# Patient Record
Sex: Male | Born: 1960 | Race: White | Hispanic: No | Marital: Married | Smoking: Former smoker
Health system: Southern US, Community
[De-identification: ages and names within clinical notes are randomized; demographics above are authoritative.]

## PROBLEM LIST (undated history)

## (undated) DIAGNOSIS — K219 Gastro-esophageal reflux disease without esophagitis: Secondary | ICD-10-CM

## (undated) DIAGNOSIS — G4730 Sleep apnea, unspecified: Secondary | ICD-10-CM

## (undated) DIAGNOSIS — M1990 Unspecified osteoarthritis, unspecified site: Secondary | ICD-10-CM

## (undated) DIAGNOSIS — E119 Type 2 diabetes mellitus without complications: Secondary | ICD-10-CM

## (undated) DIAGNOSIS — E785 Hyperlipidemia, unspecified: Secondary | ICD-10-CM

## (undated) DIAGNOSIS — E669 Obesity, unspecified: Secondary | ICD-10-CM

## (undated) DIAGNOSIS — I10 Essential (primary) hypertension: Secondary | ICD-10-CM

## (undated) DIAGNOSIS — G9389 Other specified disorders of brain: Secondary | ICD-10-CM

## (undated) DIAGNOSIS — I639 Cerebral infarction, unspecified: Secondary | ICD-10-CM

## (undated) HISTORY — DX: Other specified disorders of brain: G93.89

## (undated) HISTORY — DX: Type 2 diabetes mellitus without complications: E11.9

## (undated) HISTORY — DX: Hyperlipidemia, unspecified: E78.5

## (undated) HISTORY — DX: Sleep apnea, unspecified: G47.30

## (undated) HISTORY — DX: Cerebral infarction, unspecified: I63.9

## (undated) HISTORY — DX: Obesity, unspecified: E66.9

## (undated) HISTORY — DX: Gastro-esophageal reflux disease without esophagitis: K21.9

## (undated) HISTORY — PX: SHOULDER SURGERY: SHX246

## (undated) HISTORY — DX: Unspecified osteoarthritis, unspecified site: M19.90

---

## 2013-12-05 DIAGNOSIS — Z85828 Personal history of other malignant neoplasm of skin: Secondary | ICD-10-CM | POA: Insufficient documentation

## 2014-09-19 LAB — PSA

## 2014-11-13 ENCOUNTER — Telehealth: Payer: Self-pay

## 2014-11-13 ENCOUNTER — Other Ambulatory Visit: Payer: Self-pay | Admitting: Unknown Physician Specialty

## 2014-11-13 ENCOUNTER — Other Ambulatory Visit: Payer: Self-pay

## 2014-11-13 NOTE — Telephone Encounter (Signed)
This patient belongs to Kathrine Haddock; I only refilled it in her absence The error message may have been name mismatch Please verify pharmacy for her and let her know we got request for valasartan/hctz Thanks

## 2014-11-13 NOTE — Telephone Encounter (Signed)
Paul Cannon had already filled RX.

## 2014-11-13 NOTE — Telephone Encounter (Signed)
I did not get a message about it but we do have that patient in [facility] and he does take it.

## 2014-11-13 NOTE — Telephone Encounter (Signed)
-----   Message from Arnetha Courser, MD sent at 11/13/2014 12:48 PM EDT ----- Regarding: Can you check [facility] valasartan/hctz refill? I got an error message for a BP med I can't open up [facility] to verify this is the correct patient Can you check or do you have a message about this too?

## 2014-11-24 ENCOUNTER — Telehealth: Payer: Self-pay

## 2014-11-24 NOTE — Telephone Encounter (Signed)
-----   Message from Otilio Jefferson sent at 11/10/2014 10:08 AM EDT ----- Regarding: Fwd: Re: Referrals From: Carlota Raspberry Sent: 10/24/2014  Phone Encounter: 03/14/2013  > From: Tia Masker > To: Carlota Raspberry > Sent: 10/15/2014 3:08 PM > new # to call [phone] pt called to sch colon  > From: Carlota Raspberry > To: Carlota Raspberry > Sent: 10/09/2014 9:24 AM > Phoned pt LM to contact office   > From: Shary Decamp > To: Colonoscopy, Triage > Sent: 09/23/2014 11:38 AM > Please contact pt for Colonoscopy Triage.

## 2014-11-24 NOTE — Telephone Encounter (Signed)
Letter was mailed to pt on 10-24-14 to call and schedule colonoscopy.

## 2014-12-17 ENCOUNTER — Other Ambulatory Visit: Payer: Self-pay | Admitting: Family Medicine

## 2015-01-19 ENCOUNTER — Other Ambulatory Visit: Payer: Self-pay | Admitting: Family Medicine

## 2015-01-19 NOTE — Telephone Encounter (Signed)
Routine to patient's primary provider

## 2015-02-24 ENCOUNTER — Ambulatory Visit
Admission: EM | Admit: 2015-02-24 | Discharge: 2015-02-24 | Disposition: A | Discharge status: Home / Self Care | Payer: Commercial Managed Care - PPO | Attending: Family Medicine | Admitting: Family Medicine

## 2015-02-24 DIAGNOSIS — L03116 Cellulitis of left lower limb: Secondary | ICD-10-CM | POA: Diagnosis not present

## 2015-02-24 DIAGNOSIS — E876 Hypokalemia: Secondary | ICD-10-CM

## 2015-02-24 HISTORY — DX: Essential (primary) hypertension: I10

## 2015-02-24 LAB — COMPREHENSIVE METABOLIC PANEL
ALT: 29 U/L (ref 17–63)
AST: 30 U/L (ref 15–41)
Albumin: 4 g/dL (ref 3.5–5.0)
Alkaline Phosphatase: 83 U/L (ref 38–126)
Anion gap: 10 (ref 5–15)
BUN: 18 mg/dL (ref 6–20)
CO2: 31 mmol/L (ref 22–32)
Calcium: 8.6 mg/dL — ABNORMAL LOW (ref 8.9–10.3)
Chloride: 97 mmol/L — ABNORMAL LOW (ref 101–111)
Creatinine, Ser: 1.14 mg/dL (ref 0.61–1.24)
GFR calc Af Amer: >60 mL/min (ref >60)
GFR calc non Af Amer: >60 mL/min (ref >60)
Glucose, Bld: 114 mg/dL — ABNORMAL HIGH (ref 65–99)
Potassium: 2.9 mmol/L — CL (ref 3.5–5.1)
Sodium: 138 mmol/L (ref 135–145)
Total Bilirubin: 0.8 mg/dL (ref 0.3–1.2)
Total Protein: 7.9 g/dL (ref 6.5–8.1)

## 2015-02-24 LAB — CBC WITH DIFFERENTIAL/PLATELET
Basophils Absolute: 0 10*3/uL (ref 0–0.1)
Basophils Relative: 0 %
Eosinophils Absolute: 0.1 10*3/uL (ref 0–0.7)
Eosinophils Relative: 2 %
HCT: 32.7 % — ABNORMAL LOW (ref 40.0–52.0)
Hemoglobin: 11 g/dL — ABNORMAL LOW (ref 13.0–18.0)
Lymphocytes Relative: 16 %
Lymphs Abs: 0.9 10*3/uL — ABNORMAL LOW (ref 1.0–3.6)
MCH: 27.5 pg (ref 26.0–34.0)
MCHC: 33.8 g/dL (ref 32.0–36.0)
MCV: 81.5 fL (ref 80.0–100.0)
Monocytes Absolute: 0.5 10*3/uL (ref 0.2–1.0)
Monocytes Relative: 9 %
Neutro Abs: 4.1 10*3/uL (ref 1.4–6.5)
Neutrophils Relative %: 73 %
Platelets: 224 10*3/uL (ref 150–440)
RBC: 4.01 MIL/uL — ABNORMAL LOW (ref 4.40–5.90)
RDW: 14.8 % — ABNORMAL HIGH (ref 11.5–14.5)
WBC: 5.5 10*3/uL (ref 3.8–10.6)

## 2015-02-24 MED ORDER — DOXYCYCLINE HYCLATE 100 MG PO CAPS: 100.0000 mg | ORAL_CAPSULE | Freq: Two times a day (BID) | ORAL | Status: DC

## 2015-02-24 MED ORDER — IBUPROFEN 800 MG PO TABS: 800.0000 mg | ORAL_TABLET | Freq: Three times a day (TID) | ORAL | Status: DC | PRN

## 2015-02-24 MED ORDER — POTASSIUM CHLORIDE ER 10 MEQ PO TBCR: 20.0000 meq | EXTENDED_RELEASE_TABLET | Freq: Every day | ORAL | Status: DC

## 2015-02-24 MED ORDER — POTASSIUM CHLORIDE CRYS ER 20 MEQ PO TBCR
40.0000 meq | EXTENDED_RELEASE_TABLET | Freq: Once | ORAL | Status: AC
  Administered 2015-02-24: 40 meq via ORAL

## 2015-02-24 NOTE — ED Notes (Signed)
Pt states "I have been tired and achey since the weekend. I have noticed over the last few days I have a sore spot on my left posterior leg and it spreading."

## 2015-02-24 NOTE — Discharge Instructions (Signed)
Take medication as prescribed. Apply warm compresses and elevate. Monitor skin area closely.   Also as discussed, your potassium was low today and after discussing may be related to your diet changes. Take medication as prescribed.   It is very important that you have close follow up on both the skin infection as well as potassium. You need to have both follow up on in 2-3 days. Call your primary care physician today to schedule follow up.   Return to Urgent care or go to ER for fever, weakness, chest pain, shortness of breath, dizziness, new or worsening concerns.   Cellulitis Cellulitis is an infection of the skin and the tissue beneath it. The infected area is usually red and tender. Cellulitis occurs most often in the arms and lower legs.  CAUSES  Cellulitis is caused by bacteria that enter the skin through cracks or cuts in the skin. The most common types of bacteria that cause cellulitis are staphylococci and streptococci. SIGNS AND SYMPTOMS   Redness and warmth.  Swelling.  Tenderness or pain.  Fever. DIAGNOSIS  Your health care provider can usually determine what is wrong based on a physical exam. Blood tests may also be done. TREATMENT  Treatment usually involves taking an antibiotic medicine. HOME CARE INSTRUCTIONS   Take your antibiotic medicine as directed by your health care provider. Finish the antibiotic even if you start to feel better.  Keep the infected arm or leg elevated to reduce swelling.  Apply a warm cloth to the affected area up to 4 times per day to relieve pain.  Take medicines only as directed by your health care provider.  Keep all follow-up visits as directed by your health care provider. SEEK MEDICAL CARE IF:   You notice red streaks coming from the infected area.  Your red area gets larger or turns dark in color.  Your bone or joint underneath the infected area becomes painful after the skin has healed.  Your infection returns in the same  area or another area.  You notice a swollen bump in the infected area.  You develop new symptoms.  You have a fever. SEEK IMMEDIATE MEDICAL CARE IF:   You feel very sleepy.  You develop vomiting or diarrhea.  You have a general ill feeling (malaise) with muscle aches and pains. MAKE SURE YOU:   Understand these instructions.  Will watch your condition.  Will get help right away if you are not doing well or get worse. Document Released: 03/02/2005 Document Revised: 10/07/2013 Document Reviewed: 08/08/2011 [facility] Patient Information 2015 [place], [place]. This information is not intended to replace advice given to you by your health care provider. Make sure you discuss any questions you have with your health care provider.  Hypokalemia Hypokalemia means that the amount of potassium in the blood is lower than normal.Potassium is a chemical, called an electrolyte, that helps regulate the amount of fluid in the body. It also stimulates muscle contraction and helps nerves function properly.Most of the body's potassium is inside of cells, and only a very small amount is in the blood. Because the amount in the blood is so small, minor changes can be life-threatening. CAUSES  Antibiotics.  Diarrhea or vomiting.  Using laxatives too much, which can cause diarrhea.  Chronic kidney disease.  Water pills (diuretics).  Eating disorders (bulimia).  Low magnesium level.  Sweating a lot. SIGNS AND SYMPTOMS  Weakness.  Constipation.  Fatigue.  Muscle cramps.  Mental confusion.  Skipped heartbeats or irregular heartbeat (  palpitations).  Tingling or numbness. DIAGNOSIS  Your health care provider can diagnose hypokalemia with blood tests. In addition to checking your potassium level, your health care provider may also check other lab tests. TREATMENT Hypokalemia can be treated with potassium supplements taken by mouth or adjustments in your current medicines. If your  potassium level is very low, you may need to get potassium through a vein (IV) and be monitored in the hospital. A diet high in potassium is also helpful. Foods high in potassium are:  Nuts, such as peanuts and pistachios.  Seeds, such as sunflower seeds and pumpkin seeds.  Peas, lentils, and lima beans.  Whole grain and bran cereals and breads.  Fresh fruit and vegetables, such as apricots, avocado, bananas, cantaloupe, kiwi, oranges, tomatoes, asparagus, and potatoes.  Orange and tomato juices.  Red meats.  Fruit yogurt. HOME CARE INSTRUCTIONS  Take all medicines as prescribed by your health care provider.  Maintain a healthy diet by including nutritious food, such as fruits, vegetables, nuts, whole grains, and lean meats.  If you are taking a laxative, be sure to follow the directions on the label. SEEK MEDICAL CARE IF:  Your weakness gets worse.  You feel your heart pounding or racing.  You are vomiting or having diarrhea.  You are diabetic and having trouble keeping your blood glucose in the normal range. SEEK IMMEDIATE MEDICAL CARE IF:  You have chest pain, shortness of breath, or dizziness.  You are vomiting or having diarrhea for more than 2 days.  You faint. MAKE SURE YOU:   Understand these instructions.  Will watch your condition.  Will get help right away if you are not doing well or get worse. Document Released: 05/23/2005 Document Revised: 03/13/2013 Document Reviewed: 11/23/2012 [facility] Patient Information 2015 [place], [place]. This information is not intended to replace advice given to you by your health care provider. Make sure you discuss any questions you have with your health care provider.

## 2015-02-24 NOTE — ED Provider Notes (Signed)
[facility] Emergency Department Provider Note  ____________________________________________  Time seen: Approximately 4:14 PM  I have reviewed the triage vital signs and the nursing notes.   HISTORY  Chief Complaint Insect Bite   HPI Paul Cannon is a 54 y.o. male presents with complaints of redness and swelling to the left upper leg. Patient reports that Sunday he noticed a area that was about the size of a quarter that was slightly red and tender to touch. Patient reports that that area has quickly increased in size.  States the area is tender to touch at 5 out of 6. States that it is red and warm to touch. Denies any drainage. Denies known insect bite. Denies recent tick bite or known tick exposure. 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 Reports yesterday he had an accompanying fever of 100.5 orally. States that when he was having the fever he was having body aches which he then took ibuprofen and resolved fever as well as the body aches. Denies any other body aches. Denies feeling weak. Denies other changes.  Denies chest pain, shortness of breath, dizziness, vision changes, chest palpitations, weakness, abdominal pain, dysuria or other changes.   Past Medical History  Diagnosis Date  . Hypertension     There are no active problems to display for this patient.   History reviewed. No pertinent past surgical history.  Current Outpatient Rx  Name  Route  Sig  Dispense  Refill  . amLODipine (NORVASC) 10 MG tablet   Oral   Take 1 tablet (10 mg total) by mouth daily.   90 tablet   1   . atorvastatin (LIPITOR) 10 MG tablet   Oral   Take 10 mg by mouth daily.         . metoprolol succinate (TOPROL-XL) 25 MG 24 hr tablet   Oral   Take 1 tablet (25 mg total) by mouth daily.   90 tablet   1   . valsartan (DIOVAN) 160 MG tablet   Oral   Take 160 mg by mouth daily.         . valsartan-hydrochlorothiazide  (DIOVAN-HCT) 160-25 MG per tablet   Oral   Take 1 tablet by mouth daily.   30 tablet   6   . doxycycline (VIBRAMYCIN) 100 MG capsule   Oral   Take 1 capsule (100 mg total) by mouth 2 (two) times daily.   20 capsule   0   . ibuprofen (ADVIL,MOTRIN) 800 MG tablet   Oral   Take 1 tablet (800 mg total) by mouth every 8 (eight) hours as needed for mild pain or moderate pain.   15 tablet   0   . potassium chloride (K-DUR) 10 MEQ tablet   Oral   Take 2 tablets (20 mEq total) by mouth daily.   10 tablet   0     Allergies Review of patient's allergies indicates no known allergies.  No family history on file.  Social History Social History  Substance Use Topics  . Smoking status: Never Smoker   . Smokeless tobacco: None  . Alcohol Use: No    Review of Systems Constitutional: positive fever as above Eyes: No visual changes. ENT: No sore throat. Cardiovascular: Denies chest pain. Respiratory: Denies shortness of breath. Gastrointestinal: No abdominal pain.  No nausea, no vomiting.  No diarrhea.  No constipation. Genitourinary: Negative for dysuria. Musculoskeletal: Negative for back pain. Skin: positive for rash. Neurological: Negative for headaches, focal weakness  or numbness.  10-point ROS otherwise negative.  ____________________________________________   PHYSICAL EXAM:  VITAL SIGNS: ED Triage Vitals  Enc Vitals Group     BP 02/24/15 1504 134/61 mmHg     Pulse Rate 02/24/15 1504 55     Resp 02/24/15 1504 16     Temp 02/24/15 1504 98.3 F (36.8 C)     Temp Source 02/24/15 1504 Oral     SpO2 02/24/15 1504 100 %     Weight 02/24/15 1504 305 lb (138.347 kg)     Height 02/24/15 1504 6' (1.829 m)     Head Cir --      Peak Flow --      Pain Score 02/24/15 1517 0     Pain Loc --      Pain Edu? --      Excl. in [place]? --     Constitutional: Alert and oriented. Well appearing and in no acute distress. Eyes: Conjunctivae are normal. PERRL. EOMI. Head:  Atraumatic.  Ears: no erythema, normal TMs bilaterally.   Nose: No congestion/rhinnorhea.  Mouth/Throat: Mucous membranes are moist.  Oropharynx non-erythematous. Neck: No stridor.  No cervical spine tenderness to palpation. Hematological/Lymphatic/Immunilogical: No cervical lymphadenopathy. Cardiovascular: Normal rate, regular rhythm. Grossly normal heart sounds.  Good peripheral circulation. Respiratory: Normal respiratory effort.  No retractions. Lungs CTAB. Gastrointestinal: Soft and nontender. No distention. Normal Bowel sounds.  No abdominal bruits. No CVA tenderness. Musculoskeletal: No lower or upper extremity tenderness nor edema.  No joint effusions. Bilateral pedal pulses equal and easily palpated. Bilateral calfs nontender.  Neurologic:  Normal speech and language. No gross focal neurologic deficits are appreciated. No gait instability. Skin:  Skin is warm, dry and intact. No rash noted. Except: left distal upper medial to posterior thigh with 15 cm x 14 cm area or erythema, mild induration, no fluctuance or pointing abscess.Mild TTP.  No distinct central punctum. No other rash, erythema noted.  Psychiatric: Mood and affect are normal. Speech and behavior are normal.  ____________________________________________   LABS (all labs ordered are listed, but only abnormal results are displayed)  Labs Reviewed  CBC WITH DIFFERENTIAL/PLATELET - Abnormal; Notable for the following:    RBC 4.01 (*)    Hemoglobin 11.0 (*)    HCT 32.7 (*)    RDW 14.8 (*)    Lymphs Abs 0.9 (*)    All other components within normal limits  COMPREHENSIVE METABOLIC PANEL - Abnormal; Notable for the following:    Potassium 2.9 (*)    Chloride 97 (*)    Glucose, Bld 114 (*)    Calcium 8.6 (*)    All other components within normal limits  ROCKY MTN SPOTTED FVR ABS PNL(IGG+IGM)  B. BURGDORFI ANTIBODIES   Discussed to evaluate EKG with patient due to hypokalemia, Patient refused and states does not need  EKG. Alert and oriented with decisional capacity and patient declined ekg. Discussed risks and benefits including up to death and patient verbalized understanding.   INITIAL IMPRESSION / ASSESSMENT AND PLAN / ED COURSE  Pertinent labs & imaging results that were available during my care of the patient were reviewed by me and considered in my medical decision making (see chart for details).   Very well-appearing patient. No acute distress. Presents due to the complaint of left upper inner thigh redness and tenderness to touch 2 days. Patient also reports that he had a fever with this yesterday at 100.5 orally. Patient denies other fever. Reports that he just feels that he  has had a fever with the achy body sensations yesterday only. Patient with left upper inner thigh cellulitis. Will proceed and evaluate labs including WBC. As patient reports he does occasionally works outside will evaluate for lymes as well as [employer] spotted fever. Patient denies recent tick bite or tick exposure.  Discussed with patient in detail regarding laboratory results.discussed, reviewed labs, and discussed plan of care with Dr Alveta Heimlich.  White blood cell count 5.5. However note of hypokalemia potassium 2.9. Patient denies recent changes in his medications in years. Denies recent diarrhea. Denies weakness, dizziness, chest pain, palpitations or other complaints. Patient does report that over the last several weeks he has tried a new diet. States that his primary doing an Atkins version of a diet. States with his diet he is only eating primarily meat with a very low carbohydrate intake and very low fruits and vegetable intake. Patient does take Diovan/HCTZ combination to metoprolol for his blood pressure. Reports that this is an unchanged for years. As no other changes except for his recent diet change suspect diet change indication for cause of his lower potassium. 40 mEq potassium given 1 in urgent care. Will also prescribe 20  mEq orally daily for next 5 days. Patient primary care physician is Kathrine Haddock [place] [facility]. Discussed in detail with patient to have very close follow-up. Recommend potassium reevaluation within 2-3 days. Reiterated the importance of this follow-up with patient. Also recommended 2-3 days follow-up and cellulitis recheck. Will treat cellulitis with oral doxycycline as well as rest and elevation and supportive treatments. Area of skin demarcation along cellulitis edges marked with surgical pen. Discussed with patient return parameters including worsening of redness, swelling or pain as well as weakness, dizziness, chest pain, palpitations as well as other worsening concerns.  Discussed follow up with Primary care physician this week. Discussed follow up and return parameters including no resolution or any worsening concerns. Patient verbalized understanding and agreed to plan.   ____________________________________________   FINAL CLINICAL IMPRESSION(S) / ED DIAGNOSES  Final diagnoses:  Cellulitis of left leg  Hypokalemia       Marylene Land, NP 02/24/15 1911

## 2015-02-25 LAB — B. BURGDORFI ANTIBODIES: B burgdorferi Ab IgG+IgM: <0.91 {ISR} (ref 0.00–0.90)

## 2015-02-25 LAB — ROCKY MTN SPOTTED FVR ABS PNL(IGG+IGM)
RMSF IgG: NEGATIVE
RMSF IgM: 0.23 index (ref 0.00–0.89)

## 2015-03-02 DIAGNOSIS — E669 Obesity, unspecified: Secondary | ICD-10-CM

## 2015-03-02 DIAGNOSIS — G4730 Sleep apnea, unspecified: Secondary | ICD-10-CM | POA: Insufficient documentation

## 2015-03-02 DIAGNOSIS — I10 Essential (primary) hypertension: Secondary | ICD-10-CM | POA: Insufficient documentation

## 2015-03-02 DIAGNOSIS — E785 Hyperlipidemia, unspecified: Secondary | ICD-10-CM | POA: Insufficient documentation

## 2015-03-02 DIAGNOSIS — I152 Hypertension secondary to endocrine disorders: Secondary | ICD-10-CM | POA: Insufficient documentation

## 2015-03-03 ENCOUNTER — Ambulatory Visit: Payer: Self-pay | Admitting: Unknown Physician Specialty

## 2015-03-03 ENCOUNTER — Ambulatory Visit (INDEPENDENT_AMBULATORY_CARE_PROVIDER_SITE_OTHER): Payer: Commercial Managed Care - PPO | Admitting: Unknown Physician Specialty

## 2015-03-03 ENCOUNTER — Encounter: Payer: Self-pay | Admitting: Unknown Physician Specialty

## 2015-03-03 VITALS — BP 110/68 | HR 45 | Temp 98.5°F | Ht 70.7 in | Wt 304.6 lb

## 2015-03-03 DIAGNOSIS — E669 Obesity, unspecified: Secondary | ICD-10-CM | POA: Diagnosis not present

## 2015-03-03 DIAGNOSIS — R7301 Impaired fasting glucose: Secondary | ICD-10-CM | POA: Diagnosis not present

## 2015-03-03 DIAGNOSIS — I10 Essential (primary) hypertension: Secondary | ICD-10-CM

## 2015-03-03 DIAGNOSIS — E785 Hyperlipidemia, unspecified: Secondary | ICD-10-CM

## 2015-03-03 LAB — LIPID PANEL PICCOLO, WAIVED
Chol/HDL Ratio Piccolo,Waive: 3.2 mg/dL
Cholesterol Piccolo, Waived: 92 mg/dL (ref <200)
HDL Chol Piccolo, Waived: 29 mg/dL — ABNORMAL LOW (ref >59)
LDL Chol Calc Piccolo Waived: 44 mg/dL (ref <100)
Triglycerides Piccolo,Waived: 96 mg/dL (ref <150)
VLDL Chol Calc Piccolo,Waive: 19 mg/dL (ref <30)

## 2015-03-03 LAB — BAYER DCA HB A1C WAIVED: HB A1C (BAYER DCA - WAIVED): 5.8 % (ref <7.0)

## 2015-03-03 MED ORDER — ATORVASTATIN CALCIUM 10 MG PO TABS: 10.0000 mg | ORAL_TABLET | Freq: Every day | ORAL | Status: DC

## 2015-03-03 MED ORDER — VALSARTAN 160 MG PO TABS: 160.0000 mg | ORAL_TABLET | Freq: Every day | ORAL | Status: DC

## 2015-03-03 NOTE — Progress Notes (Signed)
BP 110/68 mmHg  Pulse 45  Temp(Src) 98.5 F (36.9 C)  Ht 5' 10.7" (1.796 m)  Wt 304 lb 9.6 oz (138.166 kg)  BMI 42.83 kg/m2  SpO2 97%   Subjective:    Patient ID: Paul Cannon, male    DOB: 03/03/1961, 54 y.o.   MRN: [id number]  HPI: Paul Cannon is a 54 y.o. male  Chief Complaint  Patient presents with  . Hyperlipidemia  . Hypertension    HYPERTENSION / HYPERLIPIDEMIA He is on an Adkin's diet and losing weight.  Noted a 14 pound weight loss from the past visit   Patient is requesting refill(s). Satisfied with current treatment?  no H6  Duration of hypertension:  chronic  H4  BP monitoring frequency:  not checking.  H5  BP medication side effects:  no P1 Past BP meds:  none  P1  Duration of hyperlipidemia:  chronic  H4  Cholesterol medication side effects:  no P1  Cholesterol supplements:  none  P1 Past cholesterol medications:  none  P1 Medication compliance:  excellent compliance  P1  Aspirin:  Aspirin Therapy  on 01/29/13 Recent stressors:  no  H6   Recurrent headaches:  no  R10 Visual changes:  no  R2  Palpitations:  no  R4  Dyspnea:  no  R5  Chest pain:  no  R4  Lower extremity edema:  no  R4  Dizzy/lightheaded:  no  R10  Cellulits One week f/u.  Pt states he went to urgent care 9/20 and diagnosed with cellulitis.  He went to Urgent care and given Doxycycine to take BID.  He states it is better.     Relevant past medical, surgical, family and social history reviewed and updated as indicated. Interim medical history since our last visit reviewed. Allergies and medications reviewed and updated.  Review of Systems  Constitutional: Negative.   HENT: Negative.   Eyes: Negative.   Respiratory: Negative.   Cardiovascular: Negative.   Gastrointestinal: Negative.   Endocrine: Negative.   Genitourinary: Negative.   Skin: Negative.   Allergic/Immunologic: Negative.   Neurological: Negative.   Hematological: Negative.   Psychiatric/Behavioral:  Negative.     Per HPI unless specifically indicated above     Objective:    BP 110/68 mmHg  Pulse 45  Temp(Src) 98.5 F (36.9 C)  Ht 5' 10.7" (1.796 m)  Wt 304 lb 9.6 oz (138.166 kg)  BMI 42.83 kg/m2  SpO2 97%  Wt Readings from Last 3 Encounters:  03/03/15 304 lb 9.6 oz (138.166 kg)  09/19/14 328 lb (148.78 kg)  02/24/15 305 lb (138.347 kg)    Physical Exam  Constitutional: He is oriented to person, place, and time. He appears well-developed and well-nourished. No distress.  HENT:  Head: Normocephalic and atraumatic.  Eyes: Conjunctivae and lids are normal. Right eye exhibits no discharge. Left eye exhibits no discharge. No scleral icterus.  Cardiovascular: Normal rate, regular rhythm and normal heart sounds.   Pulmonary/Chest: Effort normal and breath sounds normal. No respiratory distress.  Abdominal: Normal appearance. There is no splenomegaly or hepatomegaly.  Musculoskeletal: Normal range of motion.  Neurological: He is alert and oriented to person, place, and time.  Skin: Skin is warm, dry and intact. No rash noted. No pallor.  Circled area on left leg showing no erythema  Psychiatric: He has a normal mood and affect. His behavior is normal. Judgment and thought content normal.  Vitals reviewed.     Assessment & Plan:   Problem  List Items Addressed This Visit      Unprioritized   Hypertension - Primary    Pt states at urgent care K was low.  Pt is losing weight and BP is good, I will stop the HCTZ.        Relevant Medications   valsartan (DIOVAN) 160 MG tablet   atorvastatin (LIPITOR) 10 MG tablet   Other Relevant Orders   Uric acid   Comprehensive metabolic panel   Hyperlipidemia    LDL is 40.  Cut Atorvastatin in 1/2.        Relevant Medications   valsartan (DIOVAN) 160 MG tablet   atorvastatin (LIPITOR) 10 MG tablet   Other Relevant Orders   Lipid Panel Piccolo, Waived   Obesity    Losing weight through Atkins      Relevant Orders   Bayer DCA  Hb A1c Waived   IFG (impaired fasting glucose)    Hgb A1C is 5.8.  Continue diet and exercise.      Relevant Orders   Bayer DCA Hb A1c Waived       Follow up plan: Return in about 6 months (around 08/31/2015).

## 2015-03-03 NOTE — Assessment & Plan Note (Signed)
Losing weight through [employer]

## 2015-03-03 NOTE — Assessment & Plan Note (Signed)
Pt states at urgent care K was low.  Pt is losing weight and BP is good, I will stop the HCTZ.

## 2015-03-03 NOTE — Assessment & Plan Note (Signed)
Hgb A1C is 5.8.  Continue diet and exercise.

## 2015-03-03 NOTE — Assessment & Plan Note (Deleted)
Diet controlled.  

## 2015-03-03 NOTE — Assessment & Plan Note (Signed)
LDL is 40.  Cut Atorvastatin in 1/2.

## 2015-03-04 ENCOUNTER — Encounter: Payer: Self-pay | Admitting: Unknown Physician Specialty

## 2015-03-04 LAB — COMPREHENSIVE METABOLIC PANEL
ALT: 30 IU/L (ref 0–44)
AST: 25 IU/L (ref 0–40)
Albumin/Globulin Ratio: 1.6 (ref 1.1–2.5)
Albumin: 4 g/dL (ref 3.5–5.5)
Alkaline Phosphatase: 84 IU/L (ref 39–117)
BUN/Creatinine Ratio: 22 — ABNORMAL HIGH (ref 9–20)
BUN: 24 mg/dL (ref 6–24)
Bilirubin Total: 0.3 mg/dL (ref 0.0–1.2)
CO2: 28 mmol/L (ref 18–29)
Calcium: 9.1 mg/dL (ref 8.7–10.2)
Chloride: 99 mmol/L (ref 97–108)
Creatinine, Ser: 1.11 mg/dL (ref 0.76–1.27)
GFR calc Af Amer: 87 mL/min/{1.73_m2} (ref >59)
GFR calc non Af Amer: 75 mL/min/{1.73_m2} (ref >59)
Globulin, Total: 2.5 g/dL (ref 1.5–4.5)
Glucose: 107 mg/dL — ABNORMAL HIGH (ref 65–99)
Potassium: 4 mmol/L (ref 3.5–5.2)
Sodium: 142 mmol/L (ref 134–144)
Total Protein: 6.5 g/dL (ref 6.0–8.5)

## 2015-03-04 LAB — URIC ACID: Uric Acid: 10.5 mg/dL — ABNORMAL HIGH (ref 3.7–8.6)

## 2015-06-22 ENCOUNTER — Other Ambulatory Visit: Payer: Self-pay | Admitting: Unknown Physician Specialty

## 2015-07-27 ENCOUNTER — Other Ambulatory Visit: Payer: Self-pay | Admitting: Unknown Physician Specialty

## 2015-09-21 ENCOUNTER — Encounter: Payer: Commercial Managed Care - PPO | Admitting: Unknown Physician Specialty

## 2015-09-28 ENCOUNTER — Other Ambulatory Visit: Payer: Self-pay | Admitting: Unknown Physician Specialty

## 2015-10-01 ENCOUNTER — Other Ambulatory Visit: Payer: Self-pay | Admitting: Unknown Physician Specialty

## 2015-10-13 ENCOUNTER — Encounter: Payer: Self-pay | Admitting: Unknown Physician Specialty

## 2015-10-13 ENCOUNTER — Ambulatory Visit (INDEPENDENT_AMBULATORY_CARE_PROVIDER_SITE_OTHER): Payer: Commercial Managed Care - PPO | Admitting: Unknown Physician Specialty

## 2015-10-13 VITALS — BP 146/76 | HR 41 | Temp 98.0°F | Ht 70.6 in | Wt 293.6 lb

## 2015-10-13 DIAGNOSIS — I10 Essential (primary) hypertension: Secondary | ICD-10-CM | POA: Diagnosis not present

## 2015-10-13 DIAGNOSIS — R001 Bradycardia, unspecified: Secondary | ICD-10-CM | POA: Insufficient documentation

## 2015-10-13 DIAGNOSIS — E785 Hyperlipidemia, unspecified: Secondary | ICD-10-CM

## 2015-10-13 DIAGNOSIS — E669 Obesity, unspecified: Secondary | ICD-10-CM

## 2015-10-13 DIAGNOSIS — M62830 Muscle spasm of back: Secondary | ICD-10-CM | POA: Insufficient documentation

## 2015-10-13 DIAGNOSIS — Z0000 Encounter for general adult medical examination without abnormal findings: Secondary | ICD-10-CM

## 2015-10-13 MED ORDER — ATORVASTATIN CALCIUM 10 MG PO TABS: 10.0000 mg | ORAL_TABLET | Freq: Every day | ORAL | Status: DC

## 2015-10-13 MED ORDER — AMLODIPINE BESYLATE 10 MG PO TABS: 10.0000 mg | ORAL_TABLET | Freq: Every day | ORAL | Status: DC

## 2015-10-13 MED ORDER — METHOCARBAMOL 750 MG PO TABS: 750.0000 mg | ORAL_TABLET | Freq: Four times a day (QID) | ORAL | Status: DC | PRN

## 2015-10-13 MED ORDER — VALSARTAN 320 MG PO TABS: 320.0000 mg | ORAL_TABLET | Freq: Every day | ORAL | Status: DC

## 2015-10-13 NOTE — Assessment & Plan Note (Signed)
Stop Metoprolol 

## 2015-10-13 NOTE — Assessment & Plan Note (Signed)
Check LDL. 

## 2015-10-13 NOTE — Assessment & Plan Note (Signed)
Pt with continued weight loss

## 2015-10-13 NOTE — Assessment & Plan Note (Addendum)
Stop Metoprolol.  Recheck back in 4 weeks.  Monitor at home.  Increase Divan from 160 mg tto 320 mgs.  Sinus bradycardia on EKG

## 2015-10-13 NOTE — Progress Notes (Signed)
BP 146/76 mmHg  Pulse 41  Temp(Src) 98 F (36.7 C)  Ht 5' 10.6" (1.793 m)  Wt 293 lb 9.6 oz (133.176 kg)  BMI 41.43 kg/m2  SpO2 96%   Subjective:    Patient ID: Paul Cannon, male    DOB: [birth year], 55 y.o.   MRN: [id number]  HPI: Paul Cannon is a 55 y.o. male  Chief Complaint  Patient presents with  . Annual Exam   Hypertension He is losing weight through making diet changes.  Took him off HCTZ last visit as went to the ER for low K  Using medications without difficulty Average home BP Not checking  No problems or lightheadedness No chest pain with exertion or shortness of breath No Edema  Back pain States he gets back spasms about 1-2 times/year that last 2-3 days.  Was taking Robaxin but wasn't working.  But, he doesn't hant anything to make him sleepy.  He does have a refill but doesn't pick it up.     Depression screen PHQ 2/9 10/13/2015  Decreased Interest 0  Down, Depressed, Hopeless 0  PHQ - 2 Score 0     Social History   Social History  . Marital Status: Single    Spouse Name: N/A  . Number of Children: N/A  . Years of Education: N/A   Occupational History  . Not on file.   Social History Main Topics  . Smoking status: Former Smoker    Quit date: 09/30/2009  . Smokeless tobacco: Never Used  . Alcohol Use: Yes     Comment: pt states occasional or very rarely  . Drug Use: No  . Sexual Activity: Yes   Other Topics Concern  . Not on file   Social History Narrative   Family History  Problem Relation Age of Onset  . Arthritis Mother   . Hyperlipidemia Mother   . Hypertension Mother   . Migraines Mother   . Heart disease Father     MI  . Stroke Father   . Lung disease Father   . Cancer Sister     breast  . Cancer Brother     melanoma  . Stroke Sister    Past Surgical History  Procedure Laterality Date  . Shoulder surgery     Past Medical History  Diagnosis Date  . Hypertension   . Sleep apnea   . Hyperlipidemia    . Obesity      Relevant past medical, surgical, family and social history reviewed and updated as indicated. Interim medical history since our last visit reviewed. Allergies and medications reviewed and updated.  Review of Systems  Per HPI unless specifically indicated above     Objective:    BP 146/76 mmHg  Pulse 41  Temp(Src) 98 F (36.7 C)  Ht 5' 10.6" (1.793 m)  Wt 293 lb 9.6 oz (133.176 kg)  BMI 41.43 kg/m2  SpO2 96%  Wt Readings from Last 3 Encounters:  10/13/15 293 lb 9.6 oz (133.176 kg)  03/03/15 304 lb 9.6 oz (138.166 kg)  09/19/14 328 lb (148.78 kg)    Physical Exam  Constitutional: He is oriented to person, place, and time. He appears well-developed and well-nourished.  HENT:  Head: Normocephalic.  Right Ear: Tympanic membrane, external ear and ear canal normal.  Left Ear: Tympanic membrane, external ear and ear canal normal.  Mouth/Throat: Uvula is midline, oropharynx is clear and moist and mucous membranes are normal.  Eyes: Pupils are equal, round, and reactive to  light.  Cardiovascular: Normal rate, regular rhythm and normal heart sounds.  Exam reveals no gallop and no friction rub.   No murmur heard. Pulmonary/Chest: Effort normal and breath sounds normal. No respiratory distress.  Abdominal: Soft. Bowel sounds are normal. He exhibits no distension. There is no tenderness.  Musculoskeletal: Normal range of motion.  Neurological: He is alert and oriented to person, place, and time. He has normal reflexes.  Skin: Skin is warm and dry.  Psychiatric: He has a normal mood and affect. His behavior is normal. Judgment and thought content normal.    Results for orders placed or performed in visit on 03/03/15  Bayer DCA Hb A1c Waived  Result Value Ref Range   Bayer DCA Hb A1c Waived 5.8 <7.0 %  Lipid Panel Piccolo, Waived  Result Value Ref Range   Cholesterol Piccolo, Waived 92 <200 mg/dL   HDL Chol Piccolo, Waived 29 (L) >59 mg/dL   Triglycerides  Piccolo,Waived 96 <150 mg/dL   Chol/HDL Ratio Piccolo,Waive 3.2 mg/dL   LDL Chol Calc Piccolo Waived 44 <100 mg/dL   VLDL Chol Calc Piccolo,Waive 19 <30 mg/dL  Uric acid  Result Value Ref Range   Uric Acid 10.5 (H) 3.7 - 8.6 mg/dL  Comprehensive metabolic panel  Result Value Ref Range   Glucose 107 (H) 65 - 99 mg/dL   BUN 24 6 - 24 mg/dL   Creatinine, Ser 1.11 0.76 - 1.27 mg/dL   GFR calc non Af Amer 75 >59 mL/min/1.73   GFR calc Af Amer 87 >59 mL/min/1.73   BUN/Creatinine Ratio 22 (H) 9 - 20   Sodium 142 134 - 144 mmol/L   Potassium 4.0 3.5 - 5.2 mmol/L   Chloride 99 97 - 108 mmol/L   CO2 28 18 - 29 mmol/L   Calcium 9.1 8.7 - 10.2 mg/dL   Total Protein 6.5 6.0 - 8.5 g/dL   Albumin 4.0 3.5 - 5.5 g/dL   Globulin, Total 2.5 1.5 - 4.5 g/dL   Albumin/Globulin Ratio 1.6 1.1 - 2.5   Bilirubin Total 0.3 0.0 - 1.2 mg/dL   Alkaline Phosphatase 84 39 - 117 IU/L   AST 25 0 - 40 IU/L   ALT 30 0 - 44 IU/L      Assessment & Plan:   Problem List Items Addressed This Visit      Unprioritized   Back spasm    OK for occasional Robaxin.  Refusing PT      Bradycardia    Stop Metoprolol      Relevant Orders   TSH   EKG 12-Lead   Hyperlipidemia    Check LDL      Relevant Medications   valsartan (DIOVAN) 320 MG tablet   atorvastatin (LIPITOR) 10 MG tablet   amLODipine (NORVASC) 10 MG tablet   Other Relevant Orders   Lipid Panel w/o Chol/HDL Ratio   Hypertension - Primary    Stop Metoprolol.  Recheck back in 4 weeks.  Monitor at home.  Increase Divan from 160 mg tto 320 mgs.  Sinus bradycardia on EKG      Relevant Medications   valsartan (DIOVAN) 320 MG tablet   atorvastatin (LIPITOR) 10 MG tablet   amLODipine (NORVASC) 10 MG tablet   Other Relevant Orders   Comprehensive metabolic panel   Obesity    Pt with continued weight loss       Other Visit Diagnoses    Annual physical exam        Relevant Orders  CBC    Ambulatory referral to Gastroenterology         Follow up plan: Return in about 4 weeks (around 11/10/2015).

## 2015-10-13 NOTE — Assessment & Plan Note (Signed)
OK for occasional Robaxin.  Refusing PT

## 2015-10-14 ENCOUNTER — Encounter: Payer: Self-pay | Admitting: Unknown Physician Specialty

## 2015-10-14 LAB — COMPREHENSIVE METABOLIC PANEL
ALT: 20 IU/L (ref 0–44)
AST: 22 IU/L (ref 0–40)
Albumin/Globulin Ratio: 1.5 (ref 1.2–2.2)
Albumin: 4.4 g/dL (ref 3.5–5.5)
Alkaline Phosphatase: 100 IU/L (ref 39–117)
BUN/Creatinine Ratio: 16 (ref 9–20)
BUN: 16 mg/dL (ref 6–24)
Bilirubin Total: 0.3 mg/dL (ref 0.0–1.2)
CO2: 23 mmol/L (ref 18–29)
Calcium: 9 mg/dL (ref 8.7–10.2)
Chloride: 100 mmol/L (ref 96–106)
Creatinine, Ser: 0.97 mg/dL (ref 0.76–1.27)
GFR calc Af Amer: 101 mL/min/{1.73_m2} (ref >59)
GFR calc non Af Amer: 88 mL/min/{1.73_m2} (ref >59)
Globulin, Total: 2.9 g/dL (ref 1.5–4.5)
Glucose: 85 mg/dL (ref 65–99)
Potassium: 4.1 mmol/L (ref 3.5–5.2)
Sodium: 142 mmol/L (ref 134–144)
Total Protein: 7.3 g/dL (ref 6.0–8.5)

## 2015-10-14 LAB — LIPID PANEL W/O CHOL/HDL RATIO
Cholesterol, Total: 128 mg/dL (ref 100–199)
HDL: 39 mg/dL — ABNORMAL LOW (ref >39)
LDL Calculated: 63 mg/dL (ref 0–99)
Triglycerides: 132 mg/dL (ref 0–149)
VLDL Cholesterol Cal: 26 mg/dL (ref 5–40)

## 2015-10-14 LAB — CBC
Hematocrit: 41.3 % (ref 37.5–51.0)
Hemoglobin: 13.6 g/dL (ref 12.6–17.7)
MCH: 26.6 pg (ref 26.6–33.0)
MCHC: 32.9 g/dL (ref 31.5–35.7)
MCV: 81 fL (ref 79–97)
Platelets: 211 10*3/uL (ref 150–379)
RBC: 5.11 x10E6/uL (ref 4.14–5.80)
RDW: 16.9 % — ABNORMAL HIGH (ref 12.3–15.4)
WBC: 6.5 10*3/uL (ref 3.4–10.8)

## 2015-10-14 LAB — TSH: TSH: 1.95 u[IU]/mL (ref 0.450–4.500)

## 2015-10-14 NOTE — Progress Notes (Signed)
Quick Note:  Normal labs. Patient notified by letter. ______ 

## 2015-10-29 ENCOUNTER — Telehealth: Payer: Self-pay

## 2015-10-29 ENCOUNTER — Other Ambulatory Visit: Payer: Self-pay

## 2015-10-29 DIAGNOSIS — Z1212 Encounter for screening for malignant neoplasm of rectum: Principal | ICD-10-CM

## 2015-10-29 DIAGNOSIS — Z1211 Encounter for screening for malignant neoplasm of colon: Secondary | ICD-10-CM

## 2015-10-29 MED ORDER — PEG 3350-KCL-NABCB-NACL-NASULF 236 G PO SOLR: 4000.0000 mL | Freq: Once | ORAL | Status: DC

## 2015-10-29 NOTE — Telephone Encounter (Signed)
Gastroenterology Pre-Procedure Review  Request Date: 11/24/15 Requesting Physician: Kathrine Haddock, NP  PATIENT REVIEW QUESTIONS: The patient responded to the following health history questions as indicated:    1. Are you having any GI issues? no 2. Do you have a personal history of Polyps? no 3. Do you have a family history of Colon Cancer or Polyps? no 4. Diabetes Mellitus? no 5. Joint replacements in the past 12 months?no 6. Major health problems in the past 3 months?no 7. Any artificial heart valves, MVP, or defibrillator?no    MEDICATIONS & ALLERGIES:    Patient reports the following regarding taking any anticoagulation/antiplatelet therapy:   Plavix, Coumadin, Eliquis, Xarelto, Lovenox, Pradaxa, Brilinta, or Effient? no Aspirin? no  Patient confirms/reports the following medications:  Current Outpatient Prescriptions  Medication Sig Dispense Refill  . amLODipine (NORVASC) 10 MG tablet Take 1 tablet (10 mg total) by mouth daily. 90 tablet 1  . atorvastatin (LIPITOR) 10 MG tablet Take 1 tablet (10 mg total) by mouth daily. 90 tablet 1  . methocarbamol (ROBAXIN) 750 MG tablet Take 1 tablet (750 mg total) by mouth every 6 (six) hours as needed. 30 tablet 1  . Multiple Vitamins-Minerals (ONE-A-DAY MENS 50+ ADVANTAGE PO) Take 1 tablet by mouth daily.    . valsartan (DIOVAN) 320 MG tablet Take 1 tablet (320 mg total) by mouth daily. 90 tablet 1   No current facility-administered medications for this visit.    Patient confirms/reports the following allergies:  Allergies  Allergen Reactions  . Percocet [Oxycodone-Acetaminophen] Diarrhea and Nausea And Vomiting  . Vicodin [Hydrocodone-Acetaminophen] Diarrhea and Nausea And Vomiting    No orders of the defined types were placed in this encounter.    AUTHORIZATION INFORMATION Primary Insurance: 1D#: Group #:  Secondary Insurance: 1D#: Group #:  SCHEDULE INFORMATION: Date: 11/24/15 Time: Location: [facility]

## 2015-11-11 ENCOUNTER — Encounter: Payer: Self-pay | Admitting: Unknown Physician Specialty

## 2015-11-11 ENCOUNTER — Ambulatory Visit (INDEPENDENT_AMBULATORY_CARE_PROVIDER_SITE_OTHER): Payer: Commercial Managed Care - PPO | Admitting: Unknown Physician Specialty

## 2015-11-11 VITALS — BP 130/79 | HR 47 | Temp 97.9°F | Ht 70.6 in | Wt 298.2 lb

## 2015-11-11 DIAGNOSIS — M7712 Lateral epicondylitis, left elbow: Secondary | ICD-10-CM

## 2015-11-11 DIAGNOSIS — R001 Bradycardia, unspecified: Secondary | ICD-10-CM

## 2015-11-11 DIAGNOSIS — I10 Essential (primary) hypertension: Secondary | ICD-10-CM

## 2015-11-11 DIAGNOSIS — M7710 Lateral epicondylitis, unspecified elbow: Secondary | ICD-10-CM | POA: Insufficient documentation

## 2015-11-11 NOTE — Assessment & Plan Note (Signed)
Seems to be typical for him.  Stable.  Will continue to monitor

## 2015-11-11 NOTE — Assessment & Plan Note (Signed)
Stable, continue present medications.   

## 2015-11-11 NOTE — Progress Notes (Signed)
BP 130/79 mmHg  Pulse 47  Temp(Src) 97.9 F (36.6 C)  Ht 5' 10.6" (1.793 m)  Wt 298 lb 3.2 oz (135.263 kg)  BMI 42.07 kg/m2  SpO2 94%   Subjective:    Patient ID: Paul Cannon, male    DOB: June 19, 1960, 55 y.o.   MRN: [id number]  HPI: Paul Cannon is a 55 y.o. male  Chief Complaint  Patient presents with  . Hypertension   Hypertension Using medications without difficulty Average home BPs are below 120/80   No problems or lightheadedness No chest pain with exertion or shortness of breath No Edema  Epicondylitis Pt with pain left lateral elbow after lifting something heavy and twisting.  He is wearing a tennis elbow brace which helps   Relevant past medical, surgical, family and social history reviewed and updated as indicated. Interim medical history since our last visit reviewed. Allergies and medications reviewed and updated.  Review of Systems  Per HPI unless specifically indicated above     Objective:    BP 130/79 mmHg  Pulse 47  Temp(Src) 97.9 F (36.6 C)  Ht 5' 10.6" (1.793 m)  Wt 298 lb 3.2 oz (135.263 kg)  BMI 42.07 kg/m2  SpO2 94%  Wt Readings from Last 3 Encounters:  11/11/15 298 lb 3.2 oz (135.263 kg)  10/13/15 293 lb 9.6 oz (133.176 kg)  03/03/15 304 lb 9.6 oz (138.166 kg)    Physical Exam  Constitutional: He is oriented to person, place, and time. He appears well-developed and well-nourished. No distress.  HENT:  Head: Normocephalic and atraumatic.  Eyes: Conjunctivae and lids are normal. Right eye exhibits no discharge. Left eye exhibits no discharge. No scleral icterus.  Neck: Normal range of motion. Neck supple. No JVD present. Carotid bruit is not present.  Cardiovascular: Regular rhythm and normal heart sounds.  Bradycardia present.   Pulmonary/Chest: Effort normal and breath sounds normal. No respiratory distress.  Abdominal: Normal appearance. There is no splenomegaly or hepatomegaly.  Musculoskeletal: Normal range of  motion.  Neurological: He is alert and oriented to person, place, and time.  Skin: Skin is warm, dry and intact. No rash noted. No pallor.  Psychiatric: He has a normal mood and affect. His behavior is normal. Judgment and thought content normal.    Results for orders placed or performed in visit on 10/13/15  CBC  Result Value Ref Range   WBC 6.5 3.4 - 10.8 x10E3/uL   RBC 5.11 4.14 - 5.80 x10E6/uL   Hemoglobin 13.6 12.6 - 17.7 g/dL   Hematocrit 41.3 37.5 - 51.0 %   MCV 81 79 - 97 fL   MCH 26.6 26.6 - 33.0 pg   MCHC 32.9 31.5 - 35.7 g/dL   RDW 16.9 (H) 12.3 - 15.4 %   Platelets 211 150 - 379 x10E3/uL  Comprehensive metabolic panel  Result Value Ref Range   Glucose 85 65 - 99 mg/dL   BUN 16 6 - 24 mg/dL   Creatinine, Ser 0.97 0.76 - 1.27 mg/dL   GFR calc non Af Amer 88 >59 mL/min/1.73   GFR calc Af Amer 101 >59 mL/min/1.73   BUN/Creatinine Ratio 16 9 - 20   Sodium 142 134 - 144 mmol/L   Potassium 4.1 3.5 - 5.2 mmol/L   Chloride 100 96 - 106 mmol/L   CO2 23 18 - 29 mmol/L   Calcium 9.0 8.7 - 10.2 mg/dL   Total Protein 7.3 6.0 - 8.5 g/dL   Albumin 4.4 3.5 - 5.5 g/dL  Globulin, Total 2.9 1.5 - 4.5 g/dL   Albumin/Globulin Ratio 1.5 1.2 - 2.2   Bilirubin Total 0.3 0.0 - 1.2 mg/dL   Alkaline Phosphatase 100 39 - 117 IU/L   AST 22 0 - 40 IU/L   ALT 20 0 - 44 IU/L  TSH  Result Value Ref Range   TSH 1.950 0.450 - 4.500 uIU/mL  Lipid Panel w/o Chol/HDL Ratio  Result Value Ref Range   Cholesterol, Total 128 100 - 199 mg/dL   Triglycerides 132 0 - 149 mg/dL   HDL 39 (L) >39 mg/dL   VLDL Cholesterol Cal 26 5 - 40 mg/dL   LDL Calculated 63 0 - 99 mg/dL      Assessment & Plan:   Problem List Items Addressed This Visit      Unprioritized   Bradycardia    Seems to be typical for him.  Stable.  Will continue to monitor      Epicondylitis, lateral    Continue tennis elbow brace.  Exercises demonstrated.        Hypertension - Primary    Stable, continue present  medications.            Follow up plan: Return in about 6 months (around 05/12/2016).

## 2015-11-11 NOTE — Assessment & Plan Note (Signed)
Continue tennis elbow brace.  Exercises demonstrated.

## 2015-11-11 NOTE — Patient Instructions (Signed)
Lateral Epicondylitis With Rehab Lateral epicondylitis involves inflammation and pain around the outer portion of the elbow. The pain is caused by inflammation of the tendons in the forearm that bring back (extend) the wrist. Lateral epicondylitis is also called tennis elbow, because it is very common in tennis players. However, it may occur in any individual who extends the wrist repetitively. If lateral epicondylitis is left untreated, it may become a chronic problem. SYMPTOMS   Pain, tenderness, and inflammation on the outer (lateral) side of the elbow.  Pain or weakness with gripping activities.  Pain that increases with wrist-twisting motions (playing tennis, using a screwdriver, opening a door or a jar).  Pain with lifting objects, including a coffee cup. CAUSES  Lateral epicondylitis is caused by inflammation of the tendons that extend the wrist. Causes of injury may include:  Repetitive stress and strain on the muscles and tendons that extend the wrist.  Sudden change in activity level or intensity.  Incorrect grip in racquet sports.  Incorrect grip size of racquet (often too large).  Incorrect hitting position or technique (usually backhand, leading with the elbow).  Using a racket that is too heavy. RISK INCREASES WITH:  Sports or occupations that require repetitive and/or strenuous forearm and wrist movements (tennis, squash, racquetball, carpentry).  Poor wrist and forearm strength and flexibility.  Failure to warm up properly before activity.  Resuming activity before healing, rehabilitation, and conditioning are complete. PREVENTION   Warm up and stretch properly before activity.  Maintain physical fitness:  Strength, flexibility, and endurance.  Cardiovascular fitness.  Wear and use properly fitted equipment.  Learn and use proper technique and have a coach correct improper technique.  Wear a tennis elbow (counterforce) brace. PROGNOSIS  The course of  this condition depends on the degree of the injury. If treated properly, acute cases (symptoms lasting less than 4 weeks) are often resolved in 2 to 6 weeks. Chronic (longer lasting cases) often resolve in 3 to 6 months but may require physical therapy. RELATED COMPLICATIONS   Frequently recurring symptoms, resulting in a chronic problem. Properly treating the problem the first time decreases frequency of recurrence.  Chronic inflammation, scarring tendon degeneration, and partial tendon tear, requiring surgery.  Delayed healing or resolution of symptoms. TREATMENT  Treatment first involves the use of ice and medicine to reduce pain and inflammation. Strengthening and stretching exercises may help reduce discomfort if performed regularly. These exercises may be performed at home if the condition is an acute injury. Chronic cases may require a referral to a physical therapist for evaluation and treatment. Your caregiver may advise a corticosteroid injection to help reduce inflammation. Rarely, surgery is needed. MEDICATION  If pain medicine is needed, nonsteroidal anti-inflammatory medicines (aspirin and ibuprofen), or other minor pain relievers (acetaminophen), are often advised.  Do not take pain medicine for 7 days before surgery.  Prescription pain relievers may be given, if your caregiver thinks they are needed. Use only as directed and only as much as you need.  Corticosteroid injections may be recommended. These injections should be reserved only for the most severe cases, because they can only be given a certain number of times. HEAT AND COLD  Cold treatment (icing) should be applied for 10 to 15 minutes every 2 to 3 hours for inflammation and pain, and immediately after activity that aggravates your symptoms. Use ice packs or an ice massage.  Heat treatment may be used before performing stretching and strengthening activities prescribed by your caregiver, physical therapist,   or  athletic trainer. Use a heat pack or a warm water soak. SEEK MEDICAL CARE IF: Symptoms get worse or do not improve in 2 weeks, despite treatment. EXERCISES  RANGE OF MOTION (ROM) AND STRETCHING EXERCISES - Epicondylitis, Lateral (Tennis Elbow) These exercises may help you when beginning to rehabilitate your injury. Your symptoms may go away with or without further involvement from your physician, physical therapist, or athletic trainer. While completing these exercises, remember:   Restoring tissue flexibility helps normal motion to return to the joints. This allows healthier, less painful movement and activity.  An effective stretch should be held for at least 30 seconds.  A stretch should never be painful. You should only feel a gentle lengthening or release in the stretched tissue. RANGE OF MOTION - Wrist Flexion, Active-Assisted  Extend your right / left elbow with your fingers pointing down.*  Gently pull the back of your hand towards you, until you feel a gentle stretch on the top of your forearm.  Hold this position for __________ seconds. Repeat __________ times. Complete this exercise __________ times per day.  *If directed by your physician, physical therapist or athletic trainer, complete this stretch with your elbow bent, rather than extended. RANGE OF MOTION - Wrist Extension, Active-Assisted  Extend your right / left elbow and turn your palm upwards.*  Gently pull your palm and fingertips back, so your wrist extends and your fingers point more toward the ground.  You should feel a gentle stretch on the inside of your forearm.  Hold this position for __________ seconds. Repeat __________ times. Complete this exercise __________ times per day. *If directed by your physician, physical therapist or athletic trainer, complete this stretch with your elbow bent, rather than extended. STRETCH - Wrist Flexion  Place the back of your right / left hand on a tabletop, leaving your  elbow slightly bent. Your fingers should point away from your body.  Gently press the back of your hand down onto the table by straightening your elbow. You should feel a stretch on the top of your forearm.  Hold this position for __________ seconds. Repeat __________ times. Complete this stretch __________ times per day.  STRETCH - Wrist Extension   Place your right / left fingertips on a tabletop, leaving your elbow slightly bent. Your fingers should point backwards.  Gently press your fingers and palm down onto the table by straightening your elbow. You should feel a stretch on the inside of your forearm.  Hold this position for __________ seconds. Repeat __________ times. Complete this stretch __________ times per day.  STRENGTHENING EXERCISES - Epicondylitis, Lateral (Tennis Elbow) These exercises may help you when beginning to rehabilitate your injury. They may resolve your symptoms with or without further involvement from your physician, physical therapist, or athletic trainer. While completing these exercises, remember:   Muscles can gain both the endurance and the strength needed for everyday activities through controlled exercises.  Complete these exercises as instructed by your physician, physical therapist or athletic trainer. Increase the resistance and repetitions only as guided.  You may experience muscle soreness or fatigue, but the pain or discomfort you are trying to eliminate should never worsen during these exercises. If this pain does get worse, stop and make sure you are following the directions exactly. If the pain is still present after adjustments, discontinue the exercise until you can discuss the trouble with your caregiver. STRENGTH - Wrist Flexors  Sit with your right / left forearm palm-up and fully supported   on a table or countertop. Your elbow should be resting below the height of your shoulder. Allow your wrist to extend over the edge of the  surface.  Loosely holding a __________ weight, or a piece of rubber exercise band or tubing, slowly curl your hand up toward your forearm.  Hold this position for __________ seconds. Slowly lower the wrist back to the starting position in a controlled manner. Repeat __________ times. Complete this exercise __________ times per day.  STRENGTH - Wrist Extensors  Sit with your right / left forearm palm-down and fully supported on a table or countertop. Your elbow should be resting below the height of your shoulder. Allow your wrist to extend over the edge of the surface.  Loosely holding a __________ weight, or a piece of rubber exercise band or tubing, slowly curl your hand up toward your forearm.  Hold this position for __________ seconds. Slowly lower the wrist back to the starting position in a controlled manner. Repeat __________ times. Complete this exercise __________ times per day.  STRENGTH - Ulnar Deviators  Stand with a ____________________ weight in your right / left hand, or sit while holding a rubber exercise band or tubing, with your healthy arm supported on a table or countertop.  Move your wrist, so that your pinkie travels toward your forearm and your thumb moves away from your forearm.  Hold this position for __________ seconds and then slowly lower the wrist back to the starting position. Repeat __________ times. Complete this exercise __________ times per day STRENGTH - Radial Deviators  Stand with a ____________________ weight in your right / left hand, or sit while holding a rubber exercise band or tubing, with your injured arm supported on a table or countertop.  Raise your hand upward in front of you or pull up on the rubber tubing.  Hold this position for __________ seconds and then slowly lower the wrist back to the starting position. Repeat __________ times. Complete this exercise __________ times per day. STRENGTH - Forearm Supinators   Sit with your right /  left forearm supported on a table, keeping your elbow below shoulder height. Rest your hand over the edge, palm down.  Gently grip a hammer or a soup ladle.  Without moving your elbow, slowly turn your palm and hand upward to a "thumbs-up" position.  Hold this position for __________ seconds. Slowly return to the starting position. Repeat __________ times. Complete this exercise __________ times per day.  STRENGTH - Forearm Pronators   Sit with your right / left forearm supported on a table, keeping your elbow below shoulder height. Rest your hand over the edge, palm up.  Gently grip a hammer or a soup ladle.  Without moving your elbow, slowly turn your palm and hand upward to a "thumbs-up" position.  Hold this position for __________ seconds. Slowly return to the starting position. Repeat __________ times. Complete this exercise __________ times per day.  STRENGTH - Grip  Grasp a tennis ball, a dense sponge, or a large, rolled sock in your hand.  Squeeze as hard as you can, without increasing any pain.  Hold this position for __________ seconds. Release your grip slowly. Repeat __________ times. Complete this exercise __________ times per day.  STRENGTH - Elbow Extensors, Isometric  Stand or sit upright, on a firm surface. Place your right / left arm so that your palm faces your stomach, and it is at the height of your waist.  Place your opposite hand on the underside   of your forearm. Gently push up as your right / left arm resists. Push as hard as you can with both arms, without causing any pain or movement at your right / left elbow. Hold this stationary position for __________ seconds. Gradually release the tension in both arms. Allow your muscles to relax completely before repeating.   This information is not intended to replace advice given to you by your health care provider. Make sure you discuss any questions you have with your health care provider.   Document Released:  05/23/2005 Document Revised: 06/13/2014 Document Reviewed: 09/04/2008 [employer] Interactive Patient Education 2016 [employer]  

## 2015-11-17 ENCOUNTER — Encounter
Admission: RE | Disposition: A | Discharge status: Home / Self Care | Payer: Self-pay | Source: Ambulatory Visit | Attending: Gastroenterology

## 2015-11-17 ENCOUNTER — Ambulatory Visit: Payer: Commercial Managed Care - PPO | Admitting: Certified Registered"

## 2015-11-17 ENCOUNTER — Ambulatory Visit
Admission: RE | Admit: 2015-11-17 | Discharge: 2015-11-17 | Disposition: A | Discharge status: Home / Self Care | Payer: Commercial Managed Care - PPO | Source: Ambulatory Visit | Attending: Gastroenterology | Admitting: Gastroenterology

## 2015-11-17 DIAGNOSIS — D123 Benign neoplasm of transverse colon: Secondary | ICD-10-CM | POA: Insufficient documentation

## 2015-11-17 DIAGNOSIS — Z803 Family history of malignant neoplasm of breast: Secondary | ICD-10-CM | POA: Insufficient documentation

## 2015-11-17 DIAGNOSIS — D490 Neoplasm of unspecified behavior of digestive system: Secondary | ICD-10-CM | POA: Insufficient documentation

## 2015-11-17 DIAGNOSIS — Z8261 Family history of arthritis: Secondary | ICD-10-CM | POA: Insufficient documentation

## 2015-11-17 DIAGNOSIS — K6289 Other specified diseases of anus and rectum: Secondary | ICD-10-CM | POA: Insufficient documentation

## 2015-11-17 DIAGNOSIS — Z1211 Encounter for screening for malignant neoplasm of colon: Secondary | ICD-10-CM | POA: Diagnosis present

## 2015-11-17 DIAGNOSIS — Z8249 Family history of ischemic heart disease and other diseases of the circulatory system: Secondary | ICD-10-CM | POA: Diagnosis not present

## 2015-11-17 DIAGNOSIS — Z9889 Other specified postprocedural states: Secondary | ICD-10-CM | POA: Insufficient documentation

## 2015-11-17 DIAGNOSIS — Z808 Family history of malignant neoplasm of other organs or systems: Secondary | ICD-10-CM | POA: Insufficient documentation

## 2015-11-17 DIAGNOSIS — G4730 Sleep apnea, unspecified: Secondary | ICD-10-CM | POA: Diagnosis not present

## 2015-11-17 DIAGNOSIS — Z87891 Personal history of nicotine dependence: Secondary | ICD-10-CM | POA: Diagnosis not present

## 2015-11-17 DIAGNOSIS — E669 Obesity, unspecified: Secondary | ICD-10-CM | POA: Diagnosis not present

## 2015-11-17 DIAGNOSIS — Z836 Family history of other diseases of the respiratory system: Secondary | ICD-10-CM | POA: Diagnosis not present

## 2015-11-17 DIAGNOSIS — D129 Benign neoplasm of anus and anal canal: Secondary | ICD-10-CM | POA: Insufficient documentation

## 2015-11-17 DIAGNOSIS — Z823 Family history of stroke: Secondary | ICD-10-CM | POA: Insufficient documentation

## 2015-11-17 DIAGNOSIS — Z6839 Body mass index (BMI) 39.0-39.9, adult: Secondary | ICD-10-CM | POA: Diagnosis not present

## 2015-11-17 DIAGNOSIS — I10 Essential (primary) hypertension: Secondary | ICD-10-CM | POA: Insufficient documentation

## 2015-11-17 DIAGNOSIS — K621 Rectal polyp: Secondary | ICD-10-CM | POA: Diagnosis not present

## 2015-11-17 DIAGNOSIS — Z8349 Family history of other endocrine, nutritional and metabolic diseases: Secondary | ICD-10-CM | POA: Insufficient documentation

## 2015-11-17 DIAGNOSIS — E785 Hyperlipidemia, unspecified: Secondary | ICD-10-CM | POA: Insufficient documentation

## 2015-11-17 DIAGNOSIS — Z885 Allergy status to narcotic agent status: Secondary | ICD-10-CM | POA: Diagnosis not present

## 2015-11-17 HISTORY — PX: COLONOSCOPY WITH PROPOFOL: SHX5780

## 2015-11-17 SURGERY — COLONOSCOPY WITH PROPOFOL
Anesthesia: General

## 2015-11-17 MED ORDER — PROPOFOL 10 MG/ML IV BOLUS
INTRAVENOUS | Status: DC | PRN
  Administered 2015-11-17: 20 mg via INTRAVENOUS
  Administered 2015-11-17: 100 mg via INTRAVENOUS

## 2015-11-17 MED ORDER — LIDOCAINE 2% (20 MG/ML) 5 ML SYRINGE
INTRAMUSCULAR | Status: DC | PRN
  Administered 2015-11-17: 25 mg via INTRAVENOUS

## 2015-11-17 MED ORDER — GLYCOPYRROLATE 0.2 MG/ML IJ SOLN
INTRAMUSCULAR | Status: DC | PRN
  Administered 2015-11-17: 0.2 mg via INTRAVENOUS

## 2015-11-17 MED ORDER — SODIUM CHLORIDE 0.9 % IV SOLN
INTRAVENOUS | Status: DC
  Administered 2015-11-17: 1000 mL via INTRAVENOUS

## 2015-11-17 MED ORDER — PROPOFOL 500 MG/50ML IV EMUL
INTRAVENOUS | Status: DC | PRN
  Administered 2015-11-17: 120 ug/kg/min via INTRAVENOUS

## 2015-11-17 NOTE — Anesthesia Preprocedure Evaluation (Signed)
Anesthesia Evaluation  Patient identified by MRN, date of birth, ID band Patient awake    Reviewed: Allergy & Precautions, NPO status , Patient's Chart, lab work & pertinent test results  History of Anesthesia Complications Negative for: history of anesthetic complications  Airway Mallampati: II       Dental   Pulmonary neg pulmonary ROS, sleep apnea and Continuous Positive Airway Pressure Ventilation , former smoker,           Cardiovascular hypertension, Pt. on medications      Neuro/Psych negative neurological ROS     GI/Hepatic negative GI ROS, Neg liver ROS,   Endo/Other  negative endocrine ROS  Renal/GU negative Renal ROS     Musculoskeletal  (+) Arthritis , Osteoarthritis,    Abdominal   Peds  Hematology negative hematology ROS (+)   Anesthesia Other Findings   Reproductive/Obstetrics                             Anesthesia Physical Anesthesia Plan  ASA: II  Anesthesia Plan: General   Post-op Pain Management:    Induction: Intravenous  Airway Management Planned: Nasal Cannula  Additional Equipment:   Intra-op Plan:   Post-operative Plan:   Informed Consent: I have reviewed the patients History and Physical, chart, labs and discussed the procedure including the risks, benefits and alternatives for the proposed anesthesia with the patient or authorized representative who has indicated his/her understanding and acceptance.     Plan Discussed with:   Anesthesia Plan Comments:         Anesthesia Quick Evaluation

## 2015-11-17 NOTE — Op Note (Signed)
[facility] Patient Name: Paul Cannon Procedure Date: 11/17/2015 11:00 AM MRN: [id number] Account #: [id number] Date of Birth: [birth year] Admit Type: Outpatient Age: 55 Room: [facility] ENDO ROOM 4 Gender: Male Note Status: Finalized Procedure:            Colonoscopy Indications:          Screening for colorectal malignant neoplasm Providers:            [name], MD Referring MD:         Kathrine Haddock, PA (Referring MD) Medicines:            Propofol per Anesthesia Complications:        No immediate complications. Procedure:            Pre-Anesthesia Assessment:                       - Prior to the procedure, a History and Physical was                        performed, and patient medications and allergies were                        reviewed. The patient's tolerance of previous                        anesthesia was also reviewed. The risks and benefits of                        the procedure and the sedation options and risks were                        discussed with the patient. All questions were                        answered, and informed consent was obtained. Prior                        Anticoagulants: The patient has taken no previous                        anticoagulant or antiplatelet agents. ASA Grade                        Assessment: II - A patient with mild systemic disease.                        After reviewing the risks and benefits, the patient was                        deemed in satisfactory condition to undergo the                        procedure.                       After obtaining informed consent, the colonoscope was                        passed under direct vision. Throughout the procedure,  the patient's blood pressure, pulse, and oxygen                        saturations were monitored continuously. The                        Colonoscope was introduced through the anus and          advanced to the the cecum, identified by appendiceal                        orifice and ileocecal valve. The colonoscopy was                        performed without difficulty. The patient tolerated the                        procedure well. The quality of the bowel preparation                        was excellent. Findings:      The digital rectal exam findings include palpable an anal mass.      A 5 mm polyp was found in the transverse colon. The polyp was sessile.       The polyp was removed with a cold snare. Resection and retrieval were       complete.      A polypoid non-obstructing large mass was found at the anus. The mass       was non-circumferential. No bleeding was present. This was biopsied with       a cold forceps for histology. Impression:           - Palpable rectal mass found on digital rectal exam.                       - One 5 mm polyp in the transverse colon, removed with                        a cold snare. Resected and retrieved.                       - Tumor at the anus. Biopsied. Recommendation:       - Await pathology results. Procedure Code(s):    --- Professional ---                       [id number], Colonoscopy, flexible; with removal of tumor(s),                        polyp(s), or other lesion(s) by snare technique                       45380, 9, Colonoscopy, flexible; with biopsy, single                        or multiple Diagnosis Code(s):    --- Professional ---                       Z12.11, Encounter for screening for malignant neoplasm  of colon                       K62.89, Other specified diseases of anus and rectum                       D49.0, Neoplasm of unspecified behavior of digestive                        system                       D12.3, Benign neoplasm of transverse colon (hepatic                        flexure or splenic flexure) CPT copyright 2016 American Medical Association. All rights reserved. The codes  documented in this report are preliminary and upon coder review may  be revised to meet current compliance requirements. [name], MD 11/17/2015 11:56:08 AM This report has been signed electronically. Number of Addenda: 0 Note Initiated On: 11/17/2015 11:00 AM Scope Withdrawal Time: 0 hours 13 minutes 12 seconds  Total Procedure Duration: 0 hours 16 minutes 50 seconds       [facility]

## 2015-11-17 NOTE — H&P (Signed)
[name], MD [facility] [address], [place], [place] [address] Phone: [phone] Fax : [phone]  Primary Care Physician:  Kathrine Haddock, NP Primary Gastroenterologist:  Dr. Allen Norris  Pre-Procedure History & Physical: HPI:  Paul Cannon is a 55 y.o. male is here for a screening colonoscopy.   Past Medical History  Diagnosis Date  . Hypertension   . Sleep apnea   . Hyperlipidemia   . Obesity     Past Surgical History  Procedure Laterality Date  . Shoulder surgery      Prior to Admission medications   Medication Sig Start Date End Date Taking? Authorizing Provider  amLODipine (NORVASC) 10 MG tablet Take 1 tablet (10 mg total) by mouth daily. 10/13/15  Yes Kathrine Haddock, NP  atorvastatin (LIPITOR) 10 MG tablet Take 1 tablet (10 mg total) by mouth daily. 10/13/15  Yes Kathrine Haddock, NP  methocarbamol (ROBAXIN) 750 MG tablet Take 1 tablet (750 mg total) by mouth every 6 (six) hours as needed. 10/13/15  Yes Kathrine Haddock, NP  Multiple Vitamins-Minerals (ONE-A-DAY MENS 50+ ADVANTAGE PO) Take 1 tablet by mouth daily.   Yes Historical Provider, MD  polyethylene glycol (GOLYTELY) 236 g solution Take 4,000 mLs by mouth once. Drink one 8 oz glass every 20 mins until stools are clear 10/29/15  Yes [name], MD  valsartan (DIOVAN) 320 MG tablet Take 1 tablet (320 mg total) by mouth daily. 10/13/15  Yes Kathrine Haddock, NP    Allergies as of 10/29/2015 - Review Complete 10/13/2015  Allergen Reaction Noted  . Percocet [oxycodone-acetaminophen] Diarrhea and Nausea And Vomiting 03/02/2015  . Vicodin [hydrocodone-acetaminophen] Diarrhea and Nausea And Vomiting 03/02/2015    Family History  Problem Relation Age of Onset  . Arthritis Mother   . Hyperlipidemia Mother   . Hypertension Mother   . Migraines Mother   . Heart disease Father     MI  . Stroke Father   . Lung disease Father   . Cancer Sister     breast  . Cancer Brother     melanoma  . Stroke Sister     Social  History   Social History  . Marital Status: Single    Spouse Name: N/A  . Number of Children: N/A  . Years of Education: N/A   Occupational History  . Not on file.   Social History Main Topics  . Smoking status: Former Smoker    Quit date: 09/30/2009  . Smokeless tobacco: Never Used  . Alcohol Use: Yes     Comment: pt states occasional or very rarely  . Drug Use: No  . Sexual Activity: Yes   Other Topics Concern  . Not on file   Social History Narrative    Review of Systems: See HPI, otherwise negative ROS  Physical Exam: BP 170/77 mmHg  Pulse 56  Temp(Src) 96.8 F (36 C) (Tympanic)  Resp 16  Ht 6' (1.829 m)  Wt 290 lb (131.543 kg)  BMI 39.32 kg/m2  SpO2 99% General:   Alert,  pleasant and cooperative in NAD Head:  Normocephalic and atraumatic. Neck:  Supple; no masses or thyromegaly. Lungs:  Clear throughout to auscultation.    Heart:  Regular rate and rhythm. Abdomen:  Soft, nontender and nondistended. Normal bowel sounds, without guarding, and without rebound.   Neurologic:  Alert and  oriented x4;  grossly normal neurologically.  Impression/Plan: Paul Cannon is now here to undergo a screening colonoscopy.  Risks, benefits, and alternatives regarding colonoscopy have been reviewed  with the patient.  Questions have been answered.  All parties agreeable.

## 2015-11-17 NOTE — Transfer of Care (Signed)
Immediate Anesthesia Transfer of Care Note  Patient: Paul Cannon  Procedure(s) Performed: Procedure(s): COLONOSCOPY WITH PROPOFOL (N/A)  Patient Location: Endoscopy Unit  Anesthesia Type:General  Level of Consciousness: awake and alert   Airway & Oxygen Therapy: Patient Spontanous Breathing and Patient connected to nasal cannula oxygen  Post-op Assessment: Report given to RN and Post -op Vital signs reviewed and stable  Post vital signs: Reviewed  Last Vitals:  Filed Vitals:   11/17/15 0953 11/17/15 1128  BP: 170/77 106/49  Pulse: 56 50  Temp: 36 C 36 C  Resp: 16 16    Last Pain: There were no vitals filed for this visit.       Complications: No apparent anesthesia complications

## 2015-11-17 NOTE — Anesthesia Postprocedure Evaluation (Signed)
Anesthesia Post Note  Patient: Paul Cannon  Procedure(s) Performed: Procedure(s) (LRB): COLONOSCOPY WITH PROPOFOL (N/A)  Patient location during evaluation: Endoscopy Anesthesia Type: General Level of consciousness: awake and alert Pain management: pain level controlled Vital Signs Assessment: post-procedure vital signs reviewed and stable Respiratory status: spontaneous breathing and respiratory function stable Cardiovascular status: stable Anesthetic complications: no    Last Vitals:  Filed Vitals:   11/17/15 0953 11/17/15 1128  BP: 170/77 106/49  Pulse: 56 50  Temp: 36 C 36 C  Resp: 16 16    Last Pain: There were no vitals filed for this visit.               Annaleah Arata K

## 2015-11-18 ENCOUNTER — Encounter: Payer: Self-pay | Admitting: Gastroenterology

## 2015-11-18 LAB — SURGICAL PATHOLOGY

## 2015-11-24 ENCOUNTER — Telehealth: Payer: Self-pay

## 2015-11-24 NOTE — Telephone Encounter (Signed)
-----   Message from [name], MD sent at 11/23/2015  1:14 PM EDT ----- Let the patient know that the surface that was biopsied of the rectal lesion did not show any cancer. That does not determine what is underneath. The patient should have a repeat colonoscopy because of the transverse colon polyp which was an adenoma, in 5 years. He should be set up to see a surgeon for removal of this anal lesion.

## 2015-11-24 NOTE — Telephone Encounter (Signed)
Pt notified of Colonoscopy results. Pt added to recall list. Pt would like to hold off on having the consultation with the surgeon for now. He is wanting to see what surgical outcome will be with his wife. I have advised him per Dr. Allen Norris, he would like him to have this rectal mass removed as we don't know what might be underneath. Pt verbalized understanding and will call me once he is done with his wife's appts to set up surgical consult.

## 2016-04-18 ENCOUNTER — Other Ambulatory Visit: Payer: Self-pay | Admitting: Unknown Physician Specialty

## 2016-05-13 ENCOUNTER — Ambulatory Visit (INDEPENDENT_AMBULATORY_CARE_PROVIDER_SITE_OTHER): Payer: Commercial Managed Care - PPO | Admitting: Unknown Physician Specialty

## 2016-05-13 ENCOUNTER — Encounter: Payer: Self-pay | Admitting: Unknown Physician Specialty

## 2016-05-13 DIAGNOSIS — G4733 Obstructive sleep apnea (adult) (pediatric): Secondary | ICD-10-CM

## 2016-05-13 DIAGNOSIS — I10 Essential (primary) hypertension: Secondary | ICD-10-CM | POA: Diagnosis not present

## 2016-05-13 MED ORDER — ATORVASTATIN CALCIUM 10 MG PO TABS: 10.0000 mg | ORAL_TABLET | Freq: Every day | ORAL | 1 refills | Status: DC

## 2016-05-13 NOTE — Assessment & Plan Note (Signed)
Discussed diet and exercise 

## 2016-05-13 NOTE — Assessment & Plan Note (Signed)
BP not to goal.  Will continue present medication and pt will work on weight loss

## 2016-05-13 NOTE — Progress Notes (Signed)
   BP (!) 147/81 (BP Location: Left Arm, Cuff Size: Large)   Pulse (!) 48   Temp 98.3 F (36.8 C)   Ht 6' (1.829 m)   Wt (!) 319 lb 3.2 oz (144.8 kg)   SpO2 97%   BMI 43.29 kg/m    Subjective:    Patient ID: Paul Cannon, male    DOB: 1960/06/28, 55 y.o.   MRN: [id number]  HPI: Paul Cannon is a 55 y.o. male  Chief Complaint  Patient presents with  . Hyperlipidemia  . Hypertension   Hypertension Using medications without difficulty Average home BPs Checking at home and gets SBP in the 140s  No problems or lightheadedness No chest pain with exertion or shortness of breath No Edema  Hyperlipidemia Using medications without problems: No Muscle aches  Diet compliance: Exercise:   Relevant past medical, surgical, family and social history reviewed and updated as indicated. Interim medical history since our last visit reviewed. Allergies and medications reviewed and updated.  Review of Systems  Per HPI unless specifically indicated above     Objective:    BP (!) 147/81 (BP Location: Left Arm, Cuff Size: Large)   Pulse (!) 48   Temp 98.3 F (36.8 C)   Ht 6' (1.829 m)   Wt (!) 319 lb 3.2 oz (144.8 kg)   SpO2 97%   BMI 43.29 kg/m   Wt Readings from Last 3 Encounters:  05/13/16 (!) 319 lb 3.2 oz (144.8 kg)  11/17/15 290 lb (131.5 kg)  11/11/15 298 lb 3.2 oz (135.3 kg)    Physical Exam  Constitutional: He is oriented to person, place, and time. He appears well-developed and well-nourished. No distress.  HENT:  Head: Normocephalic and atraumatic.  Eyes: Conjunctivae and lids are normal. Right eye exhibits no discharge. Left eye exhibits no discharge. No scleral icterus.  Neck: Normal range of motion. Neck supple. No JVD present. Carotid bruit is not present.  Cardiovascular: Normal rate, regular rhythm and normal heart sounds.   Pulmonary/Chest: Effort normal and breath sounds normal. No respiratory distress.  Abdominal: Normal appearance. There is  no splenomegaly or hepatomegaly.  Musculoskeletal: Normal range of motion.  Neurological: He is alert and oriented to person, place, and time.  Skin: Skin is warm, dry and intact. No rash noted. No pallor.  Psychiatric: He has a normal mood and affect. His behavior is normal. Judgment and thought content normal.      Assessment & Plan:   Problem List Items Addressed This Visit      Unprioritized   Hypertension    BP not to goal.  Will continue present medication and pt will work on weight loss      Relevant Medications   atorvastatin (LIPITOR) 10 MG tablet   Sleep apnea    Uses CPAP          Follow up plan: Return in about 3 months (around 08/11/2016).

## 2016-05-13 NOTE — Assessment & Plan Note (Signed)
Uses CPAP 

## 2016-08-24 ENCOUNTER — Ambulatory Visit (INDEPENDENT_AMBULATORY_CARE_PROVIDER_SITE_OTHER): Payer: Commercial Managed Care - PPO | Admitting: Unknown Physician Specialty

## 2016-08-24 ENCOUNTER — Encounter: Payer: Self-pay | Admitting: Unknown Physician Specialty

## 2016-08-24 DIAGNOSIS — I10 Essential (primary) hypertension: Secondary | ICD-10-CM

## 2016-08-24 NOTE — Assessment & Plan Note (Signed)
Will continue present meds as side-effects if we add on additional meds.  Pt has shown to decrease BP with diet.  He will get back on a diet.

## 2016-08-24 NOTE — Progress Notes (Signed)
   BP (!) 146/78 (BP Location: Left Arm, Cuff Size: Large)   Pulse (!) 56   Temp 98.3 F (36.8 C)   Wt (!) 328 lb 3.2 oz (148.9 kg)   SpO2 96%   BMI 44.51 kg/m    Subjective:    Patient ID: Paul Cannon, male    DOB: [birth year], 55 y.o.   MRN: [id number]  HPI: Paul Cannon is a 56 y.o. male  Chief Complaint  Patient presents with  . Hypertension    3 month f/up   Hypertension Using medications without difficulty.  We did have to reduce BP meds in the pas when he lost weight.   Average home BPs 139/74 before leaving.  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 Interim medical history since our last visit reviewed. Allergies and medications reviewed and updated.  Review of Systems  Per HPI unless specifically indicated above     Objective:    BP (!) 146/78 (BP Location: Left Arm, Cuff Size: Large)   Pulse (!) 56   Temp 98.3 F (36.8 C)   Wt (!) 328 lb 3.2 oz (148.9 kg)   SpO2 96%   BMI 44.51 kg/m   Wt Readings from Last 3 Encounters:  08/24/16 (!) 328 lb 3.2 oz (148.9 kg)  05/13/16 (!) 319 lb 3.2 oz (144.8 kg)  11/17/15 290 lb (131.5 kg)    Physical Exam  Constitutional: He is oriented to person, place, and time. He appears well-developed and well-nourished. No distress.  HENT:  Head: Normocephalic and atraumatic.  Eyes: Conjunctivae and lids are normal. Right eye exhibits no discharge. Left eye exhibits no discharge. No scleral icterus.  Neck: Normal range of motion. Neck supple. No JVD present. Carotid bruit is not present.  Cardiovascular: Normal rate, regular rhythm and normal heart sounds.   Pulmonary/Chest: Effort normal and breath sounds normal. No respiratory distress.  Abdominal: Normal appearance. There is no splenomegaly or  hepatomegaly.  Musculoskeletal: Normal range of motion.  Neurological: He is alert and oriented to person, place, and time.  Skin: Skin is warm, dry and intact. No rash noted. No pallor.  Psychiatric: He has a normal mood and affect. His behavior is normal. Judgment and thought content normal.     Assessment & Plan:   Problem List Items Addressed This Visit      Unprioritized   Hypertension    Will continue present meds as side-effects if we add on additional meds.  Pt has shown to decrease BP with diet.  He will get back on a diet.            Follow up plan: Return in about 3 months (around 11/24/2016).

## 2016-10-24 ENCOUNTER — Other Ambulatory Visit: Payer: Self-pay | Admitting: Unknown Physician Specialty

## 2016-10-25 NOTE — Telephone Encounter (Signed)
Last OV: 08/24/16 Next OV: 11/25/16   BMP Latest Ref Rng & Units 10/13/2015 03/03/2015 02/24/2015  Glucose 65 - 99 mg/dL 85 107(H) 114(H)  BUN 6 - 24 mg/dL 16 24 18   Creatinine 0.76 - 1.27 mg/dL 0.97 1.11 1.14  BUN/Creat Ratio 9 - 20 16 22(H) -  Sodium 134 - 144 mmol/L 142 142 138  Potassium 3.5 - 5.2 mmol/L 4.1 4.0 2.9(LL)  Chloride 96 - 106 mmol/L 100 99 97(L)  CO2 18 - 29 mmol/L 23 28 31   Calcium 8.7 - 10.2 mg/dL 9.0 9.1 8.6(L)      Lab Results  Component Value Date   CHOL 128 10/13/2015   HDL 39 (L) 10/13/2015   LDLCALC 63 10/13/2015   TRIG 132 10/13/2015   Lab Results  Component Value Date   CREATININE 0.97 10/13/2015   BUN 16 10/13/2015   NA 142 10/13/2015   K 4.1 10/13/2015   CL 100 10/13/2015   CO2 23 10/13/2015

## 2016-11-25 ENCOUNTER — Ambulatory Visit: Payer: Commercial Managed Care - PPO | Admitting: Unknown Physician Specialty

## 2016-12-26 ENCOUNTER — Telehealth: Payer: Self-pay

## 2016-12-26 MED ORDER — TELMISARTAN 80 MG PO TABS: 80.0000 mg | ORAL_TABLET | Freq: Every day | ORAL | 1 refills | Status: DC

## 2016-12-26 NOTE — Telephone Encounter (Signed)
Pharmacy sent a fax regarding Valsartan being recalled. They suggest switching to telmisartan 80 mg, olmesartan 40 mg, or irbesartan 300 mg. Pharmacy is [facility].

## 2016-12-26 NOTE — Addendum Note (Signed)
Addended by: Kathrine Haddock on: 12/26/2016 04:28 PM   Modules accepted: Orders

## 2016-12-26 NOTE — Telephone Encounter (Signed)
Called and notified patient of medication change.  

## 2017-05-01 ENCOUNTER — Other Ambulatory Visit: Payer: Self-pay | Admitting: Unknown Physician Specialty

## 2017-05-01 ENCOUNTER — Other Ambulatory Visit: Payer: Self-pay | Admitting: Family Medicine

## 2017-05-01 NOTE — Telephone Encounter (Signed)
Your patient 

## 2017-05-03 ENCOUNTER — Telehealth: Payer: Self-pay | Admitting: Unknown Physician Specialty

## 2017-05-03 NOTE — Telephone Encounter (Signed)
Called pharmacy because according to chart, patient should not be out of medication and last RX was written for 90 days. Pharmacy states that they have a prescription ready for pick up and a refill on file. I asked if these were for 90 day supplies and pharmacy stated that they are but patient's insurance will only allow a 30 day prescription at a time. Will notify patient of this.

## 2017-05-03 NOTE — Telephone Encounter (Signed)
Patient would like a refill on his telmisartan 80 mg for 90 day supply sent to [facility]

## 2017-05-03 NOTE — Telephone Encounter (Signed)
Patient notified about prescription.

## 2017-07-04 ENCOUNTER — Other Ambulatory Visit: Payer: Self-pay | Admitting: Unknown Physician Specialty

## 2017-07-24 ENCOUNTER — Encounter: Payer: Self-pay | Admitting: Unknown Physician Specialty

## 2017-07-24 ENCOUNTER — Ambulatory Visit (INDEPENDENT_AMBULATORY_CARE_PROVIDER_SITE_OTHER): Payer: Commercial Managed Care - PPO | Admitting: Unknown Physician Specialty

## 2017-07-24 VITALS — BP 137/74 | HR 53 | Temp 98.6°F | Ht 70.7 in | Wt 319.5 lb

## 2017-07-24 DIAGNOSIS — R590 Localized enlarged lymph nodes: Secondary | ICD-10-CM | POA: Diagnosis not present

## 2017-07-24 DIAGNOSIS — I10 Essential (primary) hypertension: Secondary | ICD-10-CM | POA: Diagnosis not present

## 2017-07-24 DIAGNOSIS — Z125 Encounter for screening for malignant neoplasm of prostate: Secondary | ICD-10-CM

## 2017-07-24 DIAGNOSIS — E783 Hyperchylomicronemia: Secondary | ICD-10-CM

## 2017-07-24 NOTE — Progress Notes (Signed)
BP 137/74   Pulse (!) 53   Temp 98.6 F (37 C) (Oral)   Ht 5' 10.7" (1.796 m)   Wt (!) 319 lb 8 oz (144.9 kg)   SpO2 95%   BMI 44.94 kg/m    Subjective:    Patient ID: Paul Cannon, male    DOB: 1961-01-08, 57 y.o.   MRN: [id number]  HPI: Paul Cannon is a 57 y.o. male  Chief Complaint  Patient presents with  . Hyperlipidemia  . Hypertension   Hypertension Using medications without difficulty Average home BPs   No problems or lightheadedness No chest pain with exertion or shortness of breath No Edema   Hyperlipidemia Using medications without problems: No Muscle aches  Diet compliance:Exercise: Started back with an Adkin's diet.    Back pain Stiffness comes and goes.  Took a Flexeril one night and felt better. Would like a prescription for occasional use   Relevant past medical, surgical, family and social history reviewed and updated as indicated. Interim medical history since our last visit reviewed. Allergies and medications reviewed and updated.  Review of Systems  Constitutional: Negative.   HENT:       Has a cold that seems to be getting better. Right tonsilar lymph node enlargement noted 2 months ago.  No trouble swallowing  Respiratory: Negative.   Cardiovascular: Negative.   Musculoskeletal:       Knee pain  Psychiatric/Behavioral: Negative.     Per HPI unless specifically indicated above     Objective:    BP 137/74   Pulse (!) 53   Temp 98.6 F (37 C) (Oral)   Ht 5' 10.7" (1.796 m)   Wt (!) 319 lb 8 oz (144.9 kg)   SpO2 95%   BMI 44.94 kg/m   Wt Readings from Last 3 Encounters:  07/24/17 (!) 319 lb 8 oz (144.9 kg)  08/24/16 (!) 328 lb 3.2 oz (148.9 kg)  05/13/16 (!) 319 lb 3.2 oz (144.8 kg)    Physical Exam  Constitutional: He is oriented to person, place, and time. He appears well-developed and well-nourished. No distress.  HENT:  Head: Normocephalic and atraumatic.  Eyes: Conjunctivae and lids are normal. Right eye  exhibits no discharge. Left eye exhibits no discharge. No scleral icterus.  Neck: Normal range of motion. Neck supple. No JVD present. Carotid bruit is not present.  Cardiovascular: Normal rate, regular rhythm and normal heart sounds.  Pulmonary/Chest: Effort normal and breath sounds normal. No respiratory distress.  Abdominal: Normal appearance. There is no splenomegaly or hepatomegaly.  Musculoskeletal: Normal range of motion.  Lymphadenopathy:    He has cervical adenopathy.       Right cervical: Deep cervical adenopathy present.  Neurological: He is alert and oriented to person, place, and time.  Skin: Skin is warm, dry and intact. No rash noted. No pallor.  Psychiatric: He has a normal mood and affect. His behavior is normal. Judgment and thought content normal.      Assessment & Plan:   Problem List Items Addressed This Visit      Unprioritized   Hyperlipidemia   Relevant Orders   Lipid Panel w/o Chol/HDL Ratio   Hypertension    Stable, continue present medications.        Relevant Orders   Comprehensive metabolic panel   TSH    Other Visit Diagnoses    Anterior cervical lymphadenopathy    -  Primary   Relevant Orders   Ambulatory referral to ENT  Prostate cancer screening       Relevant Orders   PSA       Follow up plan: Return in about 6 months (around 01/21/2018) for physical.

## 2017-07-24 NOTE — Assessment & Plan Note (Signed)
Stable, continue present medications.   

## 2017-07-25 ENCOUNTER — Encounter: Payer: Self-pay | Admitting: Unknown Physician Specialty

## 2017-07-25 LAB — LIPID PANEL W/O CHOL/HDL RATIO
Cholesterol, Total: 134 mg/dL (ref 100–199)
HDL: 39 mg/dL — ABNORMAL LOW (ref >39)
LDL Calculated: 68 mg/dL (ref 0–99)
Triglycerides: 135 mg/dL (ref 0–149)
VLDL Cholesterol Cal: 27 mg/dL (ref 5–40)

## 2017-07-25 LAB — COMPREHENSIVE METABOLIC PANEL
ALT: 66 IU/L — ABNORMAL HIGH (ref 0–44)
AST: 44 IU/L — ABNORMAL HIGH (ref 0–40)
Albumin/Globulin Ratio: 1.5 (ref 1.2–2.2)
Albumin: 4.3 g/dL (ref 3.5–5.5)
Alkaline Phosphatase: 94 IU/L (ref 39–117)
BUN/Creatinine Ratio: 11 (ref 9–20)
BUN: 10 mg/dL (ref 6–24)
Bilirubin Total: 0.5 mg/dL (ref 0.0–1.2)
CO2: 24 mmol/L (ref 20–29)
Calcium: 8.9 mg/dL (ref 8.7–10.2)
Chloride: 101 mmol/L (ref 96–106)
Creatinine, Ser: 0.93 mg/dL (ref 0.76–1.27)
GFR calc Af Amer: 106 mL/min/{1.73_m2} (ref >59)
GFR calc non Af Amer: 91 mL/min/{1.73_m2} (ref >59)
Globulin, Total: 2.9 g/dL (ref 1.5–4.5)
Glucose: 103 mg/dL — ABNORMAL HIGH (ref 65–99)
Potassium: 3.6 mmol/L (ref 3.5–5.2)
Sodium: 142 mmol/L (ref 134–144)
Total Protein: 7.2 g/dL (ref 6.0–8.5)

## 2017-07-25 LAB — TSH: TSH: 1.8 u[IU]/mL (ref 0.450–4.500)

## 2017-07-25 LAB — PSA: Prostate Specific Ag, Serum: 0.8 ng/mL (ref 0.0–4.0)

## 2017-07-25 MED ORDER — AMLODIPINE BESYLATE 10 MG PO TABS: 10.0000 mg | ORAL_TABLET | Freq: Every day | ORAL | 1 refills | Status: DC

## 2017-07-25 MED ORDER — CYCLOBENZAPRINE HCL 10 MG PO TABS: 10.0000 mg | ORAL_TABLET | Freq: Every day | ORAL | 5 refills | Status: DC

## 2017-07-25 MED ORDER — ATORVASTATIN CALCIUM 10 MG PO TABS: 10.0000 mg | ORAL_TABLET | Freq: Every day | ORAL | 1 refills | Status: DC

## 2017-07-25 NOTE — Progress Notes (Signed)
Patient notified of results by mychart.

## 2017-08-18 ENCOUNTER — Other Ambulatory Visit: Payer: Self-pay | Admitting: Otolaryngology

## 2017-08-18 DIAGNOSIS — R221 Localized swelling, mass and lump, neck: Secondary | ICD-10-CM

## 2017-08-29 ENCOUNTER — Ambulatory Visit: Payer: Commercial Managed Care - PPO

## 2017-09-04 ENCOUNTER — Ambulatory Visit
Admission: RE | Admit: 2017-09-04 | Discharge: 2017-09-04 | Disposition: A | Discharge status: Home / Self Care | Payer: Commercial Managed Care - PPO | Source: Ambulatory Visit | Attending: Otolaryngology | Admitting: Otolaryngology

## 2017-09-04 DIAGNOSIS — E079 Disorder of thyroid, unspecified: Secondary | ICD-10-CM | POA: Insufficient documentation

## 2017-09-04 DIAGNOSIS — R221 Localized swelling, mass and lump, neck: Secondary | ICD-10-CM | POA: Diagnosis present

## 2017-09-04 MED ORDER — IOPAMIDOL (ISOVUE-300) INJECTION 61%
75.0000 mL | Freq: Once | INTRAVENOUS | Status: AC | PRN
  Administered 2017-09-04: 75 mL via INTRAVENOUS

## 2017-09-07 ENCOUNTER — Other Ambulatory Visit: Payer: Self-pay | Admitting: Otolaryngology

## 2017-09-07 DIAGNOSIS — E041 Nontoxic single thyroid nodule: Secondary | ICD-10-CM

## 2017-09-07 DIAGNOSIS — R221 Localized swelling, mass and lump, neck: Secondary | ICD-10-CM

## 2017-09-14 ENCOUNTER — Ambulatory Visit: Admission: RE | Admit: 2017-09-14 | Payer: Commercial Managed Care - PPO | Source: Ambulatory Visit

## 2017-09-14 ENCOUNTER — Ambulatory Visit
Admission: RE | Admit: 2017-09-14 | Discharge: 2017-09-14 | Disposition: A | Discharge status: Home / Self Care | Payer: Commercial Managed Care - PPO | Source: Ambulatory Visit | Attending: Otolaryngology | Admitting: Otolaryngology

## 2017-09-14 DIAGNOSIS — E669 Obesity, unspecified: Secondary | ICD-10-CM | POA: Insufficient documentation

## 2017-09-14 DIAGNOSIS — G4730 Sleep apnea, unspecified: Secondary | ICD-10-CM | POA: Diagnosis not present

## 2017-09-14 DIAGNOSIS — E785 Hyperlipidemia, unspecified: Secondary | ICD-10-CM | POA: Diagnosis not present

## 2017-09-14 DIAGNOSIS — I10 Essential (primary) hypertension: Secondary | ICD-10-CM | POA: Insufficient documentation

## 2017-09-14 DIAGNOSIS — Z79899 Other long term (current) drug therapy: Secondary | ICD-10-CM | POA: Diagnosis not present

## 2017-09-14 DIAGNOSIS — R220 Localized swelling, mass and lump, head: Secondary | ICD-10-CM | POA: Diagnosis not present

## 2017-09-14 DIAGNOSIS — Z87891 Personal history of nicotine dependence: Secondary | ICD-10-CM | POA: Insufficient documentation

## 2017-09-14 DIAGNOSIS — R221 Localized swelling, mass and lump, neck: Secondary | ICD-10-CM

## 2017-09-14 DIAGNOSIS — E041 Nontoxic single thyroid nodule: Secondary | ICD-10-CM | POA: Diagnosis present

## 2017-09-14 DIAGNOSIS — M47812 Spondylosis without myelopathy or radiculopathy, cervical region: Secondary | ICD-10-CM | POA: Diagnosis not present

## 2017-09-14 MED ORDER — SODIUM CHLORIDE 0.9 % IV SOLN
INTRAVENOUS | Status: DC
  Administered 2017-09-14: 10:00:00 via INTRAVENOUS

## 2017-09-14 MED ORDER — MIDAZOLAM HCL 2 MG/2ML IJ SOLN
INTRAMUSCULAR | Status: AC
  Filled 2017-09-14: qty 2

## 2017-09-14 MED ORDER — FENTANYL CITRATE (PF) 100 MCG/2ML IJ SOLN
INTRAMUSCULAR | Status: AC
  Filled 2017-09-14: qty 2

## 2017-09-14 MED ORDER — ONDANSETRON HCL 4 MG/2ML IJ SOLN
INTRAMUSCULAR | Status: AC
  Filled 2017-09-14: qty 2

## 2017-09-14 MED ORDER — MIDAZOLAM HCL 2 MG/2ML IJ SOLN
INTRAMUSCULAR | Status: AC | PRN
  Administered 2017-09-14: 1 mg via INTRAVENOUS

## 2017-09-14 MED ORDER — MIDAZOLAM HCL 5 MG/5ML IJ SOLN
INTRAMUSCULAR | Status: AC | PRN
  Administered 2017-09-14: 1 mg via INTRAVENOUS

## 2017-09-14 NOTE — H&P (Signed)
Chief Complaint: Patient was seen in consultation today for right submandibular mass at the request of [place]  Referring Physician(s): Vaught,Creighton  Patient Status: [facility] - Out-pt  History of Present Illness: Paul Cannon is a 57 y.o. male with palpable right submandibular mass.  CT shows a 2.2 cm well-circumscribed ovoid right submandibular mass abutting the submandibular gland. Also evidence of a possible left thyroid nodule.  Right neck mass is asymptomatic.  Thyroid US is performed first prior to planned biopsy of submandibular mass in order to assess whether thyroid FNA also indicated. Does have history of prior surgical removal of benign right neck cyst in his 20's.  Past Medical History:  Diagnosis Date  . Hyperlipidemia   . Hypertension   . Obesity   . Sleep apnea     Past Surgical History:  Procedure Laterality Date  . COLONOSCOPY WITH PROPOFOL N/A 11/17/2015   Procedure: COLONOSCOPY WITH PROPOFOL;  Surgeon: [name], MD;  Location: [facility] ENDOSCOPY;  Service: Endoscopy;  Laterality: N/A;  . SHOULDER SURGERY      Allergies: Percocet [oxycodone-acetaminophen] and Vicodin [hydrocodone-acetaminophen]  Medications: Prior to Admission medications   Medication Sig Start Date End Date Taking? Authorizing Provider  amLODipine (NORVASC) 10 MG tablet Take 1 tablet (10 mg total) by mouth daily. 07/25/17   Kathrine Haddock, NP  atorvastatin (LIPITOR) 10 MG tablet Take 1 tablet (10 mg total) by mouth daily. 07/25/17   Kathrine Haddock, NP  cyclobenzaprine (FLEXERIL) 10 MG tablet Take 1 tablet (10 mg total) by mouth at bedtime. 07/25/17   Kathrine Haddock, NP  methocarbamol (ROBAXIN) 750 MG tablet Take 1 tablet (750 mg total) by mouth every 6 (six) hours as needed. 10/13/15   Kathrine Haddock, NP  Multiple Vitamins-Minerals (ONE-A-DAY MENS 50+ ADVANTAGE PO) Take 1 tablet by mouth daily.    [provider]  telmisartan (MICARDIS) 80 MG tablet TAKE 1 TABLET BY  MOUTH ONCE DAILY Patient not taking: Reported on 09/14/2017 07/04/17   Kathrine Haddock, NP  valsartan-hydrochlorothiazide (DIOVAN-HCT) 160-12.5 MG tablet Take 1 tablet by mouth daily.    [provider]     Family History  Problem Relation Age of Onset  . Arthritis Mother   . Hyperlipidemia Mother   . Hypertension Mother   . Migraines Mother   . Heart disease Father        MI  . Stroke Father   . Lung disease Father   . Cancer Sister        breast  . Cancer Brother        melanoma  . Stroke Sister     Social History   Socioeconomic History  . Marital status: Married    Spouse name: Not on file  . Number of children: Not on file  . Years of education: Not on file  . Highest education level: Not on file  Occupational History  . Not on file  Social Needs  . Financial resource strain: Not on file  . Food insecurity:    Worry: Not on file    Inability: Not on file  . Transportation needs:    Medical: Not on file    Non-medical: Not on file  Tobacco Use  . Smoking status: Former Smoker    Last attempt to quit: 09/30/2009    Years since quitting: 7.9  . Smokeless tobacco: Never Used  Substance and Sexual Activity  . Alcohol use: Yes    Comment: pt states occasional or very rarely  . Drug use: No  .  Sexual activity: Yes  Lifestyle  . Physical activity:    Days per week: Not on file    Minutes per session: Not on file  . Stress: Not on file  Relationships  . Social connections:    Talks on phone: Not on file    Gets together: Not on file    Attends religious service: Not on file    Active member of club or organization: Not on file    Attends meetings of clubs or organizations: Not on file    Relationship status: Not on file  Other Topics Concern  . Not on file  Social History Narrative  . Not on file     Review of Systems: A 12 point ROS discussed and pertinent positives are indicated in the HPI above.  All other systems are negative.  Review of  Systems  Constitutional: Negative.   HENT:       Palpable right submandibular neck mass.  Respiratory: Negative.   Gastrointestinal: Negative.   Genitourinary: Negative.   Musculoskeletal: Negative.   Skin: Negative.   Neurological: Negative.     Vital Signs: There were no vitals taken for this visit.  Physical Exam  Constitutional: He is oriented to person, place, and time. He appears well-developed and well-nourished. No distress.  HENT:  Head: Normocephalic and atraumatic.  Neck: Neck supple. No JVD present. No thyromegaly present.  Palpable smooth mass in right submandibular region.  Cardiovascular: Normal rate, regular rhythm and normal heart sounds. Exam reveals no gallop and no friction rub.  No murmur heard. Pulmonary/Chest: Effort normal and breath sounds normal. No stridor. No respiratory distress. He has no wheezes. He has no rales.  Abdominal: Soft. Bowel sounds are normal. He exhibits no distension and no mass. There is no tenderness. There is no rebound and no guarding.  Musculoskeletal: He exhibits no edema.  Neurological: He is alert and oriented to person, place, and time.  Skin: Skin is warm and dry. He is not diaphoretic.  Vitals reviewed.   Imaging: Ct Soft Tissue Neck W Contrast  Result Date: 09/04/2017 CLINICAL DATA:  Right submandibular mass discovered 3 months ago. EXAM: CT NECK WITH CONTRAST TECHNIQUE: Multidetector CT imaging of the neck was performed using the standard protocol following the bolus administration of intravenous contrast. CONTRAST:  14mL ISOVUE-300 IOPAMIDOL (ISOVUE-300) INJECTION 61% COMPARISON:  None. FINDINGS: Pharynx and larynx: No evidence of pharyngeal mass or swelling. No parapharyngeal or retropharyngeal inflammatory changes or fluid collection. Medial positioning of the left vocal cord with enlargement of the left laryngeal ventricle and piriform sinus. Salivary glands: 2.2 x 2.2 x 1.8 cm enhancing mass within the inferior aspect of  the right submandibular gland. No submandibular inflammatory changes. No salivary calculi. Unremarkable appearance of the left submandibular and both parotid glands. Thyroid: 2.5 x 1.8 cm soft tissue nodule adjacent to the trachea near the thoracic inlet, possibly with a thin stalk of tissue connecting it with the lower pole of the left thyroid lobe though this is not well evaluated due to quantum mottle from x-ray attenuation by the patient's shoulders. Otherwise unremarkable appearance of the thyroid. Lymph nodes: Slightly increased number of small/normal sized lymph nodes in the neck bilaterally including numerous subcentimeter level IB and II lymph nodes bilaterally, the majority being 5 mm or less in short axis. Vascular: Right greater than left carotid bifurcation calcified plaque. Limited intracranial: Unremarkable. Visualized orbits: Unremarkable. Mastoids and visualized paranasal sinuses: Clear. Skeleton: C5-6 spondylosis.  No suspicious osseous lesion. Upper chest:  Clear lung apices. Other: None. IMPRESSION: 1. 2.2 cm mass in the inferior aspect of the right submandibular gland most suspicious for primary salivary neoplasm. 2. Increased number of small lymph nodes in the neck bilaterally without convincing metastatic lymphadenopathy. 3. 2.5 cm mass inferior to the left thyroid lobe. Thyroid ultrasound is recommended to assess whether this reflects an exophytic thyroid nodule or an extrathyroidal mass. 4. 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 Electronically Signed   By: Logan Bores M.D.   On: 09/04/2017 10:13   US Thyroid  Result Date: 09/14/2017 CLINICAL DATA:  Incidental on CT. Suggestion of left inferior thyroid nodule by CT. EXAM: THYROID ULTRASOUND TECHNIQUE: Ultrasound examination of the thyroid gland and adjacent soft tissues was performed. COMPARISON:  CT of the neck on 09/04/2017 FINDINGS: Parenchymal Echotexture: Mildly heterogenous Isthmus: 0.5 cm Right lobe: 4.8 x 1.7 x 1.8  cm Left lobe: 4.9 x 2.0 x 1.7 cm _________________________________________________________ Estimated total number of nodules >/= 1 cm: 1 Number of spongiform nodules >/=  2 cm not described below (TR1): 0 Number of mixed cystic and solid nodules >/= 1.5 cm not described below ([place]): 0 _________________________________________________________ Nodule # 1: Location: Left; Inferior Maximum size: 1.3 cm; Other 2 dimensions: 1.1 x 1.1 cm Composition: solid/almost completely solid (2) Echogenicity: isoechoic (1) Shape: not taller-than-wide (0) Margins: ill-defined (0) Echogenic foci: none (0) ACR TI-RADS total points: 3. ACR TI-RADS risk category: TR3 (3 points). ACR TI-RADS recommendations: Given size (<1.4 cm) and appearance, this nodule does NOT meet TI-RADS criteria for biopsy or dedicated follow-up. _________________________________________________________ Lymph node in the left neck measures approximately 1 cm in short axis and lies next to the internal jugular vein. This is not overtly enlarged. IMPRESSION: Single 1.3 cm left inferior thyroid nodule. Based on TI-RADS criteria, the nodule does not meet criteria for fine-needle aspiration or further dedicated follow-up. The above is in keeping with the ACR TI-RADS recommendations - J Am Coll Radiol 2017;14:587-595. Electronically Signed   By: Aletta Edouard M.D.   On: 09/14/2017 09:56    Labs:  CBC: No results for input(s): WBC, HGB, HCT, PLT in the last 8760 hours.  COAGS: No results for input(s): INR, APTT in the last 8760 hours.  BMP: Recent Labs    07/24/17 0913  NA 142  K 3.6  CL 101  CO2 24  GLUCOSE 103*  BUN 10  CALCIUM 8.9  CREATININE 0.93  GFRNONAA 91  GFRAA 106    LIVER FUNCTION TESTS: Recent Labs    07/24/17 0913  BILITOT 0.5  AST 44*  ALT 66*  ALKPHOS 94  PROT 7.2  ALBUMIN 4.3    Assessment and Plan:  US guided biopsy of right submandibular mass today.  Ultrasound characterization will be performed prior to  biopsy/aspiration. Consent obtained.  US shows 1.3 cm left inferior thyroid nodule that does not meet criteria for FNA or further follow up.  Thank you for this interesting consult.  I greatly enjoyed meeting [facility] and look forward to participating in their care.  A copy of this report was sent to the requesting provider on this date.  Electronically Signed: Azzie Roup, MD 09/14/2017, 10:32 AM   I spent a total of  30 Minutes in face to face in clinical consultation, greater than 50% of which was counseling/coordinating care for right submandibular mass and thyroid nodule.

## 2017-09-14 NOTE — Discharge Instructions (Signed)
Needle Biopsy, Care After °Refer to this sheet in the next few weeks. These instructions provide you with information about caring for yourself after your procedure. Your health care provider may also give you more specific instructions. Your treatment has been planned according to current medical practices, but problems sometimes occur. Call your health care provider if you have any problems or questions after your procedure. °What can I expect after the procedure? °After your procedure, it is common to have soreness, bruising, or mild pain at the biopsy site. This should go away in a few days. °Follow these instructions at home: °· Rest as directed by your health care provider. °· Take medicines only as directed by your health care provider. °· There are many different ways to close and cover the biopsy site, including stitches (sutures), skin glue, and adhesive strips. Follow your health care provider's instructions about: °? Biopsy site care. °? Bandage (dressing) changes and removal. °? Biopsy site closure removal. °· Check your biopsy site every day for signs of infection. Watch for: °? Redness, swelling, or pain. °? Fluid, blood, or pus. °Contact a health care provider if: °· You have a fever. °· You have redness, swelling, or pain at the biopsy site that lasts longer than a few days. °· You have fluid, blood, or pus coming from the biopsy site. °· You feel nauseous. °· You vomit. °Get help right away if: °· You have shortness of breath. °· You have trouble breathing. °· You have chest pain. °· You feel dizzy or you faint. °· You have bleeding that does not stop with pressure or a bandage. °· You cough up blood. °· You have pain in your abdomen. °This information is not intended to replace advice given to you by your health care provider. Make sure you discuss any questions you have with your health care provider. °Document Released: 10/07/2014 Document Revised: 10/29/2015 Document Reviewed:  05/19/2014 °Elsevier Interactive Patient Education © 2018 [employer] ° °

## 2017-09-14 NOTE — OR Nursing (Signed)
No labs needed per Dr Kathlene Cote. Dr Kathlene Cote aware of pt nausea history with narcotic, plan to sedate with versed and use local anesthesia for pain control

## 2017-09-14 NOTE — Procedures (Signed)
Interventional Radiology Procedure Note  Procedure: US guided aspirate of right submandibular lesion  Complications: None  Estimated Blood Loss: None  Findings: Right submandibular lesion consistent with cyst by [place] and appears contiguous with submandibular gland.  Measures 2.5 cm in largest diameter.  4 mL of yellow, slightly turbid fluid aspirated via 18 G needle. US shows near complete collapse and decompression of cyst after aspiration.  Fluid sent for cytology.  Venetia Night. Kathlene Cote, M.D Pager:  [phone]

## 2017-09-19 LAB — CYTOLOGY - NON PAP

## 2017-12-22 ENCOUNTER — Encounter: Payer: Self-pay | Admitting: Unknown Physician Specialty

## 2017-12-25 ENCOUNTER — Other Ambulatory Visit: Payer: Self-pay | Admitting: Unknown Physician Specialty

## 2018-01-23 ENCOUNTER — Encounter: Payer: Commercial Managed Care - PPO | Admitting: Unknown Physician Specialty

## 2018-01-25 ENCOUNTER — Ambulatory Visit (INDEPENDENT_AMBULATORY_CARE_PROVIDER_SITE_OTHER): Payer: Commercial Managed Care - PPO | Admitting: Physician Assistant

## 2018-01-25 ENCOUNTER — Encounter: Payer: Self-pay | Admitting: Physician Assistant

## 2018-01-25 ENCOUNTER — Other Ambulatory Visit: Payer: Self-pay | Admitting: Physician Assistant

## 2018-01-25 VITALS — BP 138/76 | HR 54 | Temp 98.1°F | Ht 71.25 in | Wt 329.0 lb

## 2018-01-25 DIAGNOSIS — Z0000 Encounter for general adult medical examination without abnormal findings: Secondary | ICD-10-CM | POA: Diagnosis not present

## 2018-01-25 DIAGNOSIS — G4733 Obstructive sleep apnea (adult) (pediatric): Secondary | ICD-10-CM

## 2018-01-25 DIAGNOSIS — R739 Hyperglycemia, unspecified: Secondary | ICD-10-CM

## 2018-01-25 DIAGNOSIS — Z5320 Procedure and treatment not carried out because of patient's decision for unspecified reasons: Secondary | ICD-10-CM | POA: Diagnosis not present

## 2018-01-25 DIAGNOSIS — I10 Essential (primary) hypertension: Secondary | ICD-10-CM | POA: Diagnosis not present

## 2018-01-25 DIAGNOSIS — E783 Hyperchylomicronemia: Secondary | ICD-10-CM | POA: Diagnosis not present

## 2018-01-25 DIAGNOSIS — M62830 Muscle spasm of back: Secondary | ICD-10-CM

## 2018-01-25 MED ORDER — ATORVASTATIN CALCIUM 10 MG PO TABS: 10.0000 mg | ORAL_TABLET | Freq: Every day | ORAL | 0 refills | Status: DC

## 2018-01-25 MED ORDER — AMLODIPINE BESYLATE 10 MG PO TABS: 10.0000 mg | ORAL_TABLET | Freq: Every day | ORAL | 0 refills | Status: DC

## 2018-01-25 MED ORDER — TELMISARTAN 80 MG PO TABS: 80.0000 mg | ORAL_TABLET | Freq: Every day | ORAL | 0 refills | Status: DC

## 2018-01-25 NOTE — Progress Notes (Signed)
Subjective:    Patient ID: Paul Cannon, male    DOB: 1961-02-10, 57 y.o.   MRN: [id number]  Paul Cannon is a 57 y.o. male presenting on 01/25/2018 for Annual Exam   HPI   Colonoscopy: Colon polyp, adenoma. Benign rectal lesion. Repeat 5 years.  Low Dose CT Chest: 54 pack year smoking history. Patient declines, says it would upset his wife who just went through treatment for cancer. Back spasms: occasional. Takes flexeril at night sparingly. Will take robaxin during the day occasionally. Does not mix these two together.   HTN: amlodipine 10 mg daily and Micardis 80 mg daily.  BP Readings from Last 3 Encounters:  01/25/18 138/76  09/14/17 (!) 147/73  09/14/17 (!) 150/61   HLD: Lipitor 10 mg daily. Tolerating well, last lipid panel shows well controlled cholesterol.   Lipid Panel     Component Value Date/Time   CHOL 134 07/24/2017 0913   CHOL 92 03/03/2015 0900   TRIG 135 07/24/2017 0913   TRIG 96 03/03/2015 0900   HDL 39 (L) 07/24/2017 0913   VLDL 19 03/03/2015 0900   LDLCALC 68 07/24/2017 0913   Sleep apnea: Compliant with CPAP, uses every night and it gives him good relief of fatigue and sleep issues.    Past Medical History:  Diagnosis Date  . Hyperlipidemia   . Hypertension   . Obesity   . Sleep apnea    Past Surgical History:  Procedure Laterality Date  . COLONOSCOPY WITH PROPOFOL N/A 11/17/2015   Procedure: COLONOSCOPY WITH PROPOFOL;  Surgeon: [name], MD;  Location: [facility] ENDOSCOPY;  Service: Endoscopy;  Laterality: N/A;  . SHOULDER SURGERY     Social History   Socioeconomic History  . Marital status: Married    Spouse name: Not on file  . Number of children: Not on file  . Years of education: Not on file  . Highest education level: Not on file  Occupational History  . Not on file  Social Needs  . Financial resource strain: Not on file  . Food insecurity:    Worry: Not on file    Inability: Not on file  . Transportation needs:   Medical: Not on file    Non-medical: Not on file  Tobacco Use  . Smoking status: Former Smoker    Last attempt to quit: 09/30/2009    Years since quitting: 8.3  . Smokeless tobacco: Never Used  Substance and Sexual Activity  . Alcohol use: Yes    Comment: pt states occasional or very rarely  . Drug use: No  . Sexual activity: Yes  Lifestyle  . Physical activity:    Days per week: Not on file    Minutes per session: Not on file  . Stress: Not on file  Relationships  . Social connections:    Talks on phone: Not on file    Gets together: Not on file    Attends religious service: Not on file    Active member of club or organization: Not on file    Attends meetings of clubs or organizations: Not on file    Relationship status: Not on file  . Intimate partner violence:    Fear of current or ex partner: Not on file    Emotionally abused: Not on file    Physically abused: Not on file    Forced sexual activity: Not on file  Other Topics Concern  . Not on file  Social History Narrative  . Not on file  Family History  Problem Relation Age of Onset  . Arthritis Mother   . Hyperlipidemia Mother   . Hypertension Mother   . Migraines Mother   . Heart disease Father        MI  . Stroke Father   . Lung disease Father   . Cancer Sister        breast  . Cancer Brother        melanoma  . Stroke Sister    Current Outpatient Medications on File Prior to Visit  Medication Sig  . cyclobenzaprine (FLEXERIL) 10 MG tablet Take 1 tablet (10 mg total) by mouth at bedtime.  . methocarbamol (ROBAXIN) 750 MG tablet Take 1 tablet (750 mg total) by mouth every 6 (six) hours as needed.  . Multiple Vitamins-Minerals (ONE-A-DAY MENS 50+ ADVANTAGE PO) Take 1 tablet by mouth daily.   No current facility-administered medications on file prior to visit.     Review of Systems Per HPI unless specifically indicated above     Objective:    BP 138/76   Pulse (!) 54   Temp 98.1 F (36.7 C)  (Oral)   Ht 5' 11.25" (1.81 m)   Wt (!) 329 lb (149.2 kg)   SpO2 94%   BMI 45.56 kg/m   Wt Readings from Last 3 Encounters:  01/25/18 (!) 329 lb (149.2 kg)  07/24/17 (!) 319 lb 8 oz (144.9 kg)  08/24/16 (!) 328 lb 3.2 oz (148.9 kg)    Physical Exam  Constitutional: He is oriented to person, place, and time. He appears well-developed and well-nourished.  Cardiovascular: Normal rate and regular rhythm.  Pulmonary/Chest: Effort normal and breath sounds normal.  Musculoskeletal: Normal range of motion.  Neurological: He is alert and oriented to person, place, and time.  Skin: Skin is warm and dry.  Psychiatric: He has a normal mood and affect. His behavior is normal.   Results for orders placed or performed during the hospital encounter of 09/14/17  Cytology - Non PAP; Submandibular cyst  Result Value Ref Range   CYTOLOGY - NON GYN      Cytology - Non PAP CASE: [id number] PATIENT: Paul Cannon Non-Gyn Cytology Report     SPECIMEN SUBMITTED: A. Cyst, right submandibular; aspiration  CLINICAL HISTORY: Palpable right submandibular mass  PRE-OPERATIVE DIAGNOSIS: Submandibular gland cyst vs cystic neoplasm  POST-OPERATIVE DIAGNOSIS: None provided.     DIAGNOSIS: A.  CYST, RIGHT SUBMANDIBULAR GLAND; ULTRASOUND-GUIDED FINE-NEEDLE ASPIRATION: - NON NEOPLASTIC. - INFLAMMATORY AND NECROTIC DEBRIS.  Note: Cell block material, cytospin, and ThinPrep slides were reviewed.   GROSS DESCRIPTION: A. Labeled: Right submandibular cyst Received: Fresh Volume: 3 mL Description: Cloudy tan fluid in a 10 mL syringe Submitted for 1 cell block and 1 ThinPrep    Final Diagnosis performed by Delorse Lek, MD.   Electronically signed 09/19/2017 1:46:53PM The electronic signature indicates that the named Attending Pathologist has evaluated the specimen  Technical component performed at [facility], [place], [place], [place] [address] Lab: [phone] Dir: Rush Farmer,  MD, MMM  Professional component performed at [facility], [facility], [place], [place], [place] [address] Lab: [phone] Dir: Dellia Nims. Reuel Derby, MD       Assessment & Plan:  1. Annual physical exam   2. Lung cancer screening declined by patient   3. Essential hypertension  Continue amloidpine 10 mg daily and micardis 80 mg daily. Refills #90 each.   4. Obstructive sleep apnea syndrome  Compliant with CPAP.  5. Hyperchylomicronemia  Lipitor 10 mg daily, cholesterol well controlled. Refilled #90.   6. Back Spasms  Takes muscle relaxers sparingly.   Follow up plan: Return in about 6 months (around 07/28/2018) for htn hld .  Carles Collet, PA-C [facility] 01/25/2018, 9:23 AM

## 2018-01-26 LAB — COMPREHENSIVE METABOLIC PANEL
ALT: 54 IU/L — ABNORMAL HIGH (ref 0–44)
AST: 33 IU/L (ref 0–40)
Albumin/Globulin Ratio: 1.6 (ref 1.2–2.2)
Albumin: 4.4 g/dL (ref 3.5–5.5)
Alkaline Phosphatase: 90 IU/L (ref 39–117)
BUN/Creatinine Ratio: 10 (ref 9–20)
BUN: 10 mg/dL (ref 6–24)
Bilirubin Total: 0.5 mg/dL (ref 0.0–1.2)
CO2: 23 mmol/L (ref 20–29)
Calcium: 8.9 mg/dL (ref 8.7–10.2)
Chloride: 104 mmol/L (ref 96–106)
Creatinine, Ser: 1.01 mg/dL (ref 0.76–1.27)
GFR calc Af Amer: 95 mL/min/{1.73_m2} (ref >59)
GFR calc non Af Amer: 82 mL/min/{1.73_m2} (ref >59)
Globulin, Total: 2.8 g/dL (ref 1.5–4.5)
Glucose: 104 mg/dL — ABNORMAL HIGH (ref 65–99)
Potassium: 4 mmol/L (ref 3.5–5.2)
Sodium: 142 mmol/L (ref 134–144)
Total Protein: 7.2 g/dL (ref 6.0–8.5)

## 2018-01-26 LAB — HEMOGLOBIN A1C
Est. average glucose Bld gHb Est-mCnc: 126 mg/dL
Hgb A1c MFr Bld: 6 % — ABNORMAL HIGH (ref 4.8–5.6)

## 2018-02-19 ENCOUNTER — Ambulatory Visit
Admission: EM | Admit: 2018-02-19 | Discharge: 2018-02-19 | Disposition: A | Discharge status: Home / Self Care | Payer: Commercial Managed Care - PPO | Attending: Family Medicine | Admitting: Family Medicine

## 2018-02-19 DIAGNOSIS — L03116 Cellulitis of left lower limb: Secondary | ICD-10-CM | POA: Diagnosis not present

## 2018-02-19 MED ORDER — DOXYCYCLINE HYCLATE 100 MG PO TABS: 100.0000 mg | ORAL_TABLET | Freq: Two times a day (BID) | ORAL | 0 refills | Status: DC

## 2018-02-19 NOTE — ED Triage Notes (Signed)
As per patient has cellulitis had Hx in past onset 4 days not improving.

## 2018-02-19 NOTE — ED Provider Notes (Signed)
[facility]    CSN: [id number] Arrival date & time: 02/19/18  1200     History   Chief Complaint Chief Complaint  Patient presents with  . Cellulitis    HPI Paul Cannon is a 57 y.o. male.   57 yo male with a c/o 4 days of left lower leg skin redness, warmth and tenderness to touch. Denies any fevers or chills.   The history is provided by the patient.    Past Medical History:  Diagnosis Date  . Hyperlipidemia   . Hypertension   . Obesity   . Sleep apnea     Patient Active Problem List   Diagnosis Date Noted  . Other specified diseases of anus and rectum   . Special screening for malignant neoplasms, colon   . Neoplasm of digestive system   . Benign neoplasm of transverse colon   . Epicondylitis, lateral 11/11/2015  . Bradycardia 10/13/2015  . Back spasm 10/13/2015  . IFG (impaired fasting glucose) 03/03/2015  . Sleep apnea 03/02/2015  . Hypertension 03/02/2015  . Hyperlipidemia 03/02/2015  . Obesity 03/02/2015  . Personal history of other malignant neoplasm of skin 12/05/2013    Past Surgical History:  Procedure Laterality Date  . COLONOSCOPY WITH PROPOFOL N/A 11/17/2015   Procedure: COLONOSCOPY WITH PROPOFOL;  Surgeon: [name], MD;  Location: [facility] ENDOSCOPY;  Service: Endoscopy;  Laterality: N/A;  . SHOULDER SURGERY         Home Medications    Prior to Admission medications   Medication Sig Start Date End Date Taking? Authorizing Provider  amLODipine (NORVASC) 10 MG tablet Take 1 tablet (10 mg total) by mouth daily. 01/25/18  Yes Trinna Post, PA-C  atorvastatin (LIPITOR) 10 MG tablet Take 1 tablet (10 mg total) by mouth daily. 01/25/18  Yes Trinna Post, PA-C  cyclobenzaprine (FLEXERIL) 10 MG tablet Take 1 tablet (10 mg total) by mouth at bedtime. 07/25/17  Yes Kathrine Haddock, NP  methocarbamol (ROBAXIN) 750 MG tablet Take 1 tablet (750 mg total) by mouth every 6 (six) hours as needed. 10/13/15  Yes Kathrine Haddock, NP    Multiple Vitamins-Minerals (ONE-A-DAY MENS 50+ ADVANTAGE PO) Take 1 tablet by mouth daily.   Yes [provider]  telmisartan (MICARDIS) 80 MG tablet Take 1 tablet (80 mg total) by mouth daily. 01/25/18  Yes Trinna Post, PA-C  doxycycline (VIBRA-TABS) 100 MG tablet Take 1 tablet (100 mg total) by mouth 2 (two) times daily. 02/19/18   Norval Gable, MD    Family History Family History  Problem Relation Age of Onset  . Arthritis Mother   . Hyperlipidemia Mother   . Hypertension Mother   . Migraines Mother   . Heart disease Father        MI  . Stroke Father   . Lung disease Father   . Cancer Sister        breast  . Cancer Brother        melanoma  . Stroke Sister     Social History Social History   Tobacco Use  . Smoking status: Former Smoker    Last attempt to quit: 09/30/2009    Years since quitting: 8.3  . Smokeless tobacco: Former Network engineer Use Topics  . Alcohol use: Yes    Comment: pt states occasional or very rarely  . Drug use: No     Allergies   Percocet [oxycodone-acetaminophen] and Vicodin [hydrocodone-acetaminophen]   Review of Systems Review of Systems  Physical Exam Triage Vital Signs ED Triage Vitals  Enc Vitals Group     BP 02/19/18 1216 (!) 155/71     Pulse Rate 02/19/18 1216 (!) 57     Resp 02/19/18 1216 16     Temp 02/19/18 1216 98.7 F (37.1 C)     Temp Source 02/19/18 1216 Oral     SpO2 02/19/18 1216 96 %     Weight 02/19/18 1215 (!) 320 lb (145.2 kg)     Height 02/19/18 1215 6' (1.829 m)     Head Circumference --      Peak Flow --      Pain Score 02/19/18 1214 2     Pain Loc --      Pain Edu? --      Excl. in [place]? --    No data found.  Updated Vital Signs BP (!) 155/71 (BP Location: Left Arm)   Pulse (!) 57   Temp 98.7 F (37.1 C) (Oral)   Resp 16   Ht 6' (1.829 m)   Wt (!) 145.2 kg   SpO2 96%   BMI 43.40 kg/m   Visual Acuity Right Eye Distance:   Left Eye Distance:   Bilateral Distance:     Right Eye Near:   Left Eye Near:    Bilateral Near:     Physical Exam  Constitutional: He appears well-developed and well-nourished. No distress.  Skin: He is not diaphoretic. There is erythema.  approx 6x12cm erythematous skin area on left lower leg (anterior, shin) with blanchable erythema, warmth and tenderness to palpation  Nursing note and vitals reviewed.    UC Treatments / Results  Labs (all labs ordered are listed, but only abnormal results are displayed) Labs Reviewed - No data to display  EKG None  Radiology No results found.  Procedures Procedures (including critical care time)  Medications Ordered in UC Medications - No data to display  Initial Impression / Assessment and Plan / UC Course  I have reviewed the triage vital signs and the nursing notes.  Pertinent labs & imaging results that were available during my care of the patient were reviewed by me and considered in my medical decision making (see chart for details).      Final Clinical Impressions(s) / UC Diagnoses   Final diagnoses:  Cellulitis of leg, left   Discharge Instructions   None    ED Prescriptions    Medication Sig Dispense Auth. Provider   doxycycline (VIBRA-TABS) 100 MG tablet Take 1 tablet (100 mg total) by mouth 2 (two) times daily. 20 tablet Norval Gable, MD      1. diagnosis reviewed with patient  2. rx as per orders above; reviewed possible side effects, interactions, risks and benefits  3. Recommend supportive treatment with elevation and warm compresses 4. Follow-up prn if symptoms worsen or don't improve   Controlled Substance Prescriptions [place] Controlled Substance Registry consulted? Not Applicable   Norval Gable, MD 02/19/18 [phone]

## 2018-06-28 ENCOUNTER — Encounter: Payer: Self-pay | Admitting: Nurse Practitioner

## 2018-06-28 DIAGNOSIS — R7301 Impaired fasting glucose: Secondary | ICD-10-CM | POA: Insufficient documentation

## 2018-06-28 DIAGNOSIS — E1169 Type 2 diabetes mellitus with other specified complication: Secondary | ICD-10-CM | POA: Insufficient documentation

## 2018-06-28 DIAGNOSIS — R7303 Prediabetes: Secondary | ICD-10-CM | POA: Insufficient documentation

## 2018-06-28 DIAGNOSIS — E669 Obesity, unspecified: Secondary | ICD-10-CM | POA: Insufficient documentation

## 2018-06-29 ENCOUNTER — Ambulatory Visit (INDEPENDENT_AMBULATORY_CARE_PROVIDER_SITE_OTHER): Payer: Commercial Managed Care - PPO | Admitting: Nurse Practitioner

## 2018-06-29 ENCOUNTER — Encounter: Payer: Self-pay | Admitting: Nurse Practitioner

## 2018-06-29 VITALS — BP 134/72 | HR 52 | Temp 98.1°F | Ht 72.0 in | Wt 328.2 lb

## 2018-06-29 DIAGNOSIS — G4733 Obstructive sleep apnea (adult) (pediatric): Secondary | ICD-10-CM

## 2018-06-29 DIAGNOSIS — E783 Hyperchylomicronemia: Secondary | ICD-10-CM

## 2018-06-29 DIAGNOSIS — I10 Essential (primary) hypertension: Secondary | ICD-10-CM

## 2018-06-29 NOTE — Progress Notes (Addendum)
BP 134/72 (BP Location: Left Arm, Patient Position: Sitting)   Pulse (!) 52   Temp 98.1 F (36.7 C) (Oral)   Ht 6' (1.829 m)   Wt (!) 328 lb 3.2 oz (148.9 kg)   SpO2 98%   BMI 44.51 kg/m    Subjective:    Patient ID: Paul Cannon, male    DOB: 1960/10/24, 58 y.o.   MRN: [id number]  HPI: Paul Cannon is a 58 y.o. male presents for follow-up  Chief Complaint  Patient presents with  . Hypertension  . Hyperlipidemia  . Orders    Needs new CPAP orders for supplies sent to [facility].    HYPERTENSION / HYPERLIPIDEMIA Telmisartan and Amlodipine current medications. Satisfied with current treatment? yes Duration of hypertension: chronic BP monitoring frequency: rarely BP range: 130-140/80 BP medication side effects: yes Duration of hyperlipidemia: chronic Cholesterol medication side effects: no Cholesterol supplements: none Medication compliance: good compliance Aspirin: no Recent stressors: no Recurrent headaches: no Visual changes: no Palpitations: no Dyspnea: no Chest pain: no Lower extremity edema: no Dizzy/lightheaded: no  OSA: Currently uses CPAP every night without difficulty and requires new supplies.  Will send request form.  Relevant past medical, surgical, family and social history reviewed and updated as indicated. Interim medical history since our last visit reviewed. Allergies and medications reviewed and updated.  Review of Systems  Constitutional: Negative for activity change, diaphoresis, fatigue and fever.  Respiratory: Negative for cough, chest tightness, shortness of breath and wheezing.   Cardiovascular: Negative for chest pain, palpitations and leg swelling.  Gastrointestinal: Negative for abdominal distention, abdominal pain, constipation, diarrhea, nausea and vomiting.  Endocrine: Negative for cold intolerance, heat intolerance, polydipsia, polyphagia and polyuria.  Musculoskeletal: Negative.   Skin: Negative.   Neurological:  Negative for dizziness, syncope, weakness, light-headedness, numbness and headaches.  Psychiatric/Behavioral: Negative.     Per HPI unless specifically indicated above     Objective:    BP 134/72 (BP Location: Left Arm, Patient Position: Sitting)   Pulse (!) 52   Temp 98.1 F (36.7 C) (Oral)   Ht 6' (1.829 m)   Wt (!) 328 lb 3.2 oz (148.9 kg)   SpO2 98%   BMI 44.51 kg/m   Wt Readings from Last 3 Encounters:  06/29/18 (!) 328 lb 3.2 oz (148.9 kg)  02/19/18 (!) 320 lb (145.2 kg)  01/25/18 (!) 329 lb (149.2 kg)    Physical Exam Vitals signs and nursing note reviewed.  Constitutional:      Appearance: He is well-developed.  HENT:     Head: Normocephalic and atraumatic.     Right Ear: Hearing normal. No drainage.     Left Ear: Hearing normal. No drainage.     Mouth/Throat:     Pharynx: Uvula midline.  Eyes:     General: Lids are normal.        Right eye: No discharge.        Left eye: No discharge.     Conjunctiva/sclera: Conjunctivae normal.     Pupils: Pupils are equal, round, and reactive to light.  Neck:     Musculoskeletal: Normal range of motion and neck supple.     Thyroid: No thyromegaly.     Vascular: No carotid bruit or JVD.     Trachea: Trachea normal.  Cardiovascular:     Rate and Rhythm: Normal rate and regular rhythm.     Heart sounds: Normal heart sounds, S1 normal and S2 normal. No murmur. No gallop.   Pulmonary:  Effort: Pulmonary effort is normal.     Breath sounds: Normal breath sounds.  Abdominal:     General: Bowel sounds are normal.     Palpations: Abdomen is soft. There is no hepatomegaly or splenomegaly.  Musculoskeletal: Normal range of motion.     Right lower leg: No edema.     Left lower leg: No edema.  Skin:    General: Skin is warm and dry.     Capillary Refill: Capillary refill takes less than 2 seconds.     Findings: No rash.  Neurological:     Mental Status: He is alert and oriented to person, place, and time.     Deep Tendon  Reflexes: Reflexes are normal and symmetric.  Psychiatric:        Mood and Affect: Mood normal.        Behavior: Behavior normal.        Thought Content: Thought content normal.        Judgment: Judgment normal.     Results for orders placed or performed in visit on 01/25/18  Comprehensive metabolic panel  Result Value Ref Range   Glucose 104 (H) 65 - 99 mg/dL   BUN 10 6 - 24 mg/dL   Creatinine, Ser 1.01 0.76 - 1.27 mg/dL   GFR calc non Af Amer 82 >59 mL/min/1.73   GFR calc Af Amer 95 >59 mL/min/1.73   BUN/Creatinine Ratio 10 9 - 20   Sodium 142 134 - 144 mmol/L   Potassium 4.0 3.5 - 5.2 mmol/L   Chloride 104 96 - 106 mmol/L   CO2 23 20 - 29 mmol/L   Calcium 8.9 8.7 - 10.2 mg/dL   Total Protein 7.2 6.0 - 8.5 g/dL   Albumin 4.4 3.5 - 5.5 g/dL   Globulin, Total 2.8 1.5 - 4.5 g/dL   Albumin/Globulin Ratio 1.6 1.2 - 2.2   Bilirubin Total 0.5 0.0 - 1.2 mg/dL   Alkaline Phosphatase 90 39 - 117 IU/L   AST 33 0 - 40 IU/L   ALT 54 (H) 0 - 44 IU/L  Hemoglobin A1c  Result Value Ref Range   Hgb A1c MFr Bld 6.0 (H) 4.8 - 5.6 %   Est. average glucose Bld gHb Est-mCnc 126 mg/dL      Assessment & Plan:   Problem List Items Addressed This Visit      Cardiovascular and Mediastinum   Hypertension - Primary    Chronic, ongoing.  Continue current medication regimen.          Respiratory   Sleep apnea   Relevant Orders   For home use only DME continuous positive airway pressure (CPAP)     Other   Hyperlipidemia    Chronic, ongoing.  Continue current medication regimen.  Lipid panel next visit.          Follow up plan: Return in about 6 months (around 12/28/2018) for HTN/HLD, IFG.

## 2018-06-29 NOTE — Assessment & Plan Note (Signed)
Chronic, ongoing.  Continue current medication regimen.   

## 2018-06-29 NOTE — Patient Instructions (Signed)
Diabetes Mellitus and Nutrition, Adult  When you have diabetes (diabetes mellitus), it is very important to have healthy eating habits because your blood sugar (glucose) levels are greatly affected by what you eat and drink. Eating healthy foods in the appropriate amounts, at about the same times every day, can help you:  · Control your blood glucose.  · Lower your risk of heart disease.  · Improve your blood pressure.  · Reach or maintain a healthy weight.  Every person with diabetes is different, and each person has different needs for a meal plan. Your health care provider may recommend that you work with a diet and nutrition specialist (dietitian) to make a meal plan that is best for you. Your meal plan may vary depending on factors such as:  · The calories you need.  · The medicines you take.  · Your weight.  · Your blood glucose, blood pressure, and cholesterol levels.  · Your activity level.  · Other health conditions you have, such as heart or kidney disease.  How do carbohydrates affect me?  Carbohydrates, also called carbs, affect your blood glucose level more than any other type of food. Eating carbs naturally raises the amount of glucose in your blood. Carb counting is a method for keeping track of how many carbs you eat. Counting carbs is important to keep your blood glucose at a healthy level, especially if you use insulin or take certain oral diabetes medicines.  It is important to know how many carbs you can safely have in each meal. This is different for every person. Your dietitian can help you calculate how many carbs you should have at each meal and for each snack.  Foods that contain carbs include:  · Bread, cereal, rice, pasta, and crackers.  · Potatoes and corn.  · Peas, beans, and lentils.  · Milk and yogurt.  · Fruit and juice.  · Desserts, such as cakes, cookies, ice cream, and candy.  How does alcohol affect me?  Alcohol can cause a sudden decrease in blood glucose (hypoglycemia),  especially if you use insulin or take certain oral diabetes medicines. Hypoglycemia can be a life-threatening condition. Symptoms of hypoglycemia (sleepiness, dizziness, and confusion) are similar to symptoms of having too much alcohol.  If your health care provider says that alcohol is safe for you, follow these guidelines:  · Limit alcohol intake to no more than 1 drink per day for nonpregnant women and 2 drinks per day for men. One drink equals 12 oz of beer, 5 oz of wine, or 1½ oz of hard liquor.  · Do not drink on an empty stomach.  · Keep yourself hydrated with water, diet soda, or unsweetened iced tea.  · Keep in mind that regular soda, juice, and other mixers may contain a lot of sugar and must be counted as carbs.  What are tips for following this plan?    Reading food labels  · Start by checking the serving size on the "Nutrition Facts" label of packaged foods and drinks. The amount of calories, carbs, fats, and other nutrients listed on the label is based on one serving of the item. Many items contain more than one serving per package.  · Check the total grams (g) of carbs in one serving. You can calculate the number of servings of carbs in one serving by dividing the total carbs by 15. For example, if a food has 30 g of total carbs, it would be equal to 2   servings of carbs.  · Check the number of grams (g) of saturated and trans fats in one serving. Choose foods that have low or no amount of these fats.  · Check the number of milligrams (mg) of salt (sodium) in one serving. Most people should limit total sodium intake to less than 2,300 mg per day.  · Always check the nutrition information of foods labeled as "low-fat" or "nonfat". These foods may be higher in added sugar or refined carbs and should be avoided.  · Talk to your dietitian to identify your daily goals for nutrients listed on the label.  Shopping  · Avoid buying canned, premade, or processed foods. These foods tend to be high in fat, sodium,  and added sugar.  · Shop around the outside edge of the grocery store. This includes fresh fruits and vegetables, bulk grains, fresh meats, and fresh dairy.  Cooking  · Use low-heat cooking methods, such as baking, instead of high-heat cooking methods like deep frying.  · Cook using healthy oils, such as olive, canola, or sunflower oil.  · Avoid cooking with butter, cream, or high-fat meats.  Meal planning  · Eat meals and snacks regularly, preferably at the same times every day. Avoid going long periods of time without eating.  · Eat foods high in fiber, such as fresh fruits, vegetables, beans, and whole grains. Talk to your dietitian about how many servings of carbs you can eat at each meal.  · Eat 4-6 ounces (oz) of lean protein each day, such as lean meat, chicken, fish, eggs, or tofu. One oz of lean protein is equal to:  ? 1 oz of meat, chicken, or fish.  ? 1 egg.  ? ¼ cup of tofu.  · Eat some foods each day that contain healthy fats, such as avocado, nuts, seeds, and fish.  Lifestyle  · Check your blood glucose regularly.  · Exercise regularly as told by your health care provider. This may include:  ? 150 minutes of moderate-intensity or vigorous-intensity exercise each week. This could be brisk walking, biking, or water aerobics.  ? Stretching and doing strength exercises, such as yoga or weightlifting, at least 2 times a week.  · Take medicines as told by your health care provider.  · Do not use any products that contain nicotine or tobacco, such as cigarettes and e-cigarettes. If you need help quitting, ask your health care provider.  · Work with a counselor or diabetes educator to identify strategies to manage stress and any emotional and social challenges.  Questions to ask a health care provider  · Do I need to meet with a diabetes educator?  · Do I need to meet with a dietitian?  · What number can I call if I have questions?  · When are the best times to check my blood glucose?  Where to find more  information:  · American Diabetes Association: diabetes.org  · Academy of Nutrition and Dietetics: www.eatright.org  · National Institute of Diabetes and Digestive and Kidney Diseases (NIH): www.niddk.nih.gov  Summary  · A healthy meal plan will help you control your blood glucose and maintain a healthy lifestyle.  · Working with a diet and nutrition specialist (dietitian) can help you make a meal plan that is best for you.  · Keep in mind that carbohydrates (carbs) and alcohol have immediate effects on your blood glucose levels. It is important to count carbs and to use alcohol carefully.  This information is not intended to   replace advice given to you by your health care provider. Make sure you discuss any questions you have with your health care provider.  Document Released: 02/17/2005 Document Revised: 12/21/2016 Document Reviewed: 06/27/2016  Elsevier Interactive Patient Education © 2019 Elsevier Inc.

## 2018-06-29 NOTE — Assessment & Plan Note (Signed)
Chronic, ongoing.  Continue current medication regimen.  Lipid panel next visit. 

## 2018-06-29 NOTE — Assessment & Plan Note (Signed)
Currently working on increasing exercise regimen and changing diet.  Have recommended decreasing high glucose and carb intake d/t last A1C prediabetic range at 6.0.  Will recheck A1C in July and discussed if >6.5 then we would discuss diabetic medication.

## 2018-07-05 ENCOUNTER — Telehealth: Payer: Self-pay

## 2018-07-05 NOTE — Telephone Encounter (Signed)
Copied from [facility] [phone]. Topic: General - Other >> Jul 05, 2018  2:05 PM Judyann Munson wrote: Reason for CRM:  [facility]- is calling to state they have faxed over a few request for a sign CMN, she stated she is also needing  document or notes that state the patient is benefiting and using the machine correctly. The best contact number is [phone] ext [phone].please advise

## 2018-07-06 NOTE — Telephone Encounter (Signed)
Form signed and faxed back to [facility].

## 2018-07-06 NOTE — Telephone Encounter (Signed)
OV notes printed and placed in CMA inbox w/ sticky note advising to attach to CMN once received.

## 2018-07-07 ENCOUNTER — Other Ambulatory Visit: Payer: Self-pay | Admitting: Physician Assistant

## 2018-07-07 DIAGNOSIS — I10 Essential (primary) hypertension: Secondary | ICD-10-CM

## 2018-07-07 DIAGNOSIS — E783 Hyperchylomicronemia: Secondary | ICD-10-CM

## 2018-07-31 ENCOUNTER — Ambulatory Visit: Payer: Commercial Managed Care - PPO | Admitting: Nurse Practitioner

## 2018-08-29 ENCOUNTER — Other Ambulatory Visit: Payer: Self-pay | Admitting: Unknown Physician Specialty

## 2018-08-29 NOTE — Telephone Encounter (Signed)
Requested medication (s) are due for refill today: Yes  Requested medication (s) are on the active medication list: Yes  Last refill:  07/2017  Future visit scheduled: Yes  Notes to clinic:  Expired rx, unable to refill     Requested Prescriptions  Pending Prescriptions Disp Refills   cyclobenzaprine (FLEXERIL) 10 MG tablet [Pharmacy Med Name: CYCLOBENZAPRINE HCL 10 MG TAB] 30 tablet 5    Sig: TAKE 1 TABLET BY MOUTH AT BEDTIME     Not Delegated - Analgesics:  Muscle Relaxants Failed - 08/29/2018  3:26 PM      Failed - This refill cannot be delegated      Passed - Valid encounter within last 6 months    Recent Outpatient Visits          2 months ago Essential hypertension   [place], Barbaraann Faster, NP   7 months ago Annual physical exam   [facility] Trinna Post, PA-C   1 year ago Anterior cervical lymphadenopathy   [facility] Kathrine Haddock, NP   2 years ago Essential hypertension   [place] Kathrine Haddock, NP   2 years ago Essential hypertension   [place] Kathrine Haddock, NP      Future Appointments            In 4 months Cannady, Barbaraann Faster, NP [employer], [place]

## 2018-09-25 ENCOUNTER — Other Ambulatory Visit: Payer: Self-pay | Admitting: Physician Assistant

## 2018-09-25 DIAGNOSIS — I10 Essential (primary) hypertension: Secondary | ICD-10-CM

## 2018-09-25 NOTE — Telephone Encounter (Signed)
Pharmacy requesting refills. Please review. Thanks!

## 2018-10-20 ENCOUNTER — Other Ambulatory Visit: Payer: Self-pay | Admitting: Nurse Practitioner

## 2018-10-20 DIAGNOSIS — I10 Essential (primary) hypertension: Secondary | ICD-10-CM

## 2018-10-20 DIAGNOSIS — E783 Hyperchylomicronemia: Secondary | ICD-10-CM

## 2018-10-20 NOTE — Telephone Encounter (Signed)
Requested Prescriptions  Pending Prescriptions Disp Refills  . amLODipine (NORVASC) 10 MG tablet [Pharmacy Med Name: AMLODIPINE BESYLATE 10 MG TAB] 90 tablet 0    Sig: TAKE 1 TABLET BY MOUTH ONCE DAILY     Cardiovascular:  Calcium Channel Blockers Passed - 10/20/2018  1:00 PM      Passed - Last BP in normal range    BP Readings from Last 1 Encounters:  06/29/18 134/72         Passed - Valid encounter within last 6 months    Recent Outpatient Visits          3 months ago Essential hypertension   [place], Henrine Screws T, NP   8 months ago Annual physical exam   [facility] Trinna Post, PA-C   1 year ago Anterior cervical lymphadenopathy   [facility] Kathrine Haddock, NP   2 years ago Essential hypertension   [place] Kathrine Haddock, NP   2 years ago Essential hypertension   [place] Kathrine Haddock, NP      Future Appointments            In 2 months Cannady, Barbaraann Faster, NP [employer] [facility]

## 2018-10-20 NOTE — Telephone Encounter (Signed)
Requested Prescriptions  Pending Prescriptions Disp Refills  . atorvastatin (LIPITOR) 10 MG tablet [Pharmacy Med Name: ATORVASTATIN CALCIUM 10 MG TAB] 90 tablet 0    Sig: TAKE 1 TABLET BY MOUTH ONCE DAILY     Cardiovascular:  Antilipid - Statins Failed - 10/20/2018 12:59 PM      Failed - Total Cholesterol in normal range and within 360 days    Cholesterol, Total  Date Value Ref Range Status  07/24/2017 134 100 - 199 mg/dL Final   Cholesterol Piccolo, Waived  Date Value Ref Range Status  03/03/2015 92 <200 mg/dL Final    Comment:                            Desirable                <200                         Borderline High      200- 239                         High                     >239          Failed - LDL in normal range and within 360 days    LDL Calculated  Date Value Ref Range Status  07/24/2017 68 0 - 99 mg/dL Final         Failed - HDL in normal range and within 360 days    HDL  Date Value Ref Range Status  07/24/2017 39 (L) >39 mg/dL Final         Failed - Triglycerides in normal range and within 360 days    Triglycerides  Date Value Ref Range Status  07/24/2017 135 0 - 149 mg/dL Final   Triglycerides Piccolo,Waived  Date Value Ref Range Status  03/03/2015 96 <150 mg/dL Final    Comment:                            Normal                   <150                         Borderline High     150 - 199                         High                200 - 499                         Very High                >499          Passed - Patient is not pregnant      Passed - Valid encounter within last 12 months    Recent Outpatient Visits          3 months ago Essential hypertension   [place], Barbaraann Faster, NP   8 months ago Annual physical exam   [facility] Trinna Post, PA-C   1  year ago Anterior cervical lymphadenopathy   [facility] Kathrine Haddock, NP   2 years ago Essential hypertension   [facility] Kathrine Haddock, NP   2 years ago Essential hypertension   [facility] Kathrine Haddock, NP      Future Appointments            In 2 months Cannady, Barbaraann Faster, NP [facility]

## 2018-12-27 ENCOUNTER — Other Ambulatory Visit: Payer: Self-pay | Admitting: Nurse Practitioner

## 2018-12-27 DIAGNOSIS — I10 Essential (primary) hypertension: Secondary | ICD-10-CM

## 2018-12-27 NOTE — Telephone Encounter (Signed)
Courtesy refill  

## 2018-12-31 ENCOUNTER — Ambulatory Visit (INDEPENDENT_AMBULATORY_CARE_PROVIDER_SITE_OTHER): Payer: Commercial Managed Care - PPO | Admitting: Nurse Practitioner

## 2018-12-31 ENCOUNTER — Other Ambulatory Visit: Payer: Self-pay

## 2018-12-31 ENCOUNTER — Encounter: Payer: Self-pay | Admitting: Nurse Practitioner

## 2018-12-31 DIAGNOSIS — I10 Essential (primary) hypertension: Secondary | ICD-10-CM | POA: Diagnosis not present

## 2018-12-31 DIAGNOSIS — R7303 Prediabetes: Secondary | ICD-10-CM

## 2018-12-31 DIAGNOSIS — E783 Hyperchylomicronemia: Secondary | ICD-10-CM

## 2018-12-31 DIAGNOSIS — Z1329 Encounter for screening for other suspected endocrine disorder: Secondary | ICD-10-CM

## 2018-12-31 LAB — BAYER DCA HB A1C WAIVED: HB A1C (BAYER DCA - WAIVED): 5.9 % (ref <7.0)

## 2018-12-31 NOTE — Assessment & Plan Note (Signed)
Chronic, ongoing.  Continue current medication regimen and adjust as needed.  CMP and lipid panel today. 

## 2018-12-31 NOTE — Progress Notes (Addendum)
BP 128/72 (BP Location: Left Arm, Patient Position: Sitting)   Pulse (!) 53   Temp 97.9 F (36.6 C) (Oral)   SpO2 98%    Subjective:    Patient ID: Paul Cannon, male    DOB: 01-30-1961, 59 y.o.   MRN: [id number]  HPI: Paul Cannon is a 58 y.o. male  Chief Complaint  Patient presents with  . Hyperlipidemia  . Hypertension   HYPERTENSION / HYPERLIPIDEMIA Continues on Telmisartan 80 MG and Amlodipine 10 MG daily for HTN + Atorvastatin 10 MG for HLD. Satisfied with current treatment? yes Duration of hypertension: chronic BP monitoring frequency: not checking BP range:  BP medication side effects: no Duration of hyperlipidemia: chronic Cholesterol medication side effects: no Cholesterol supplements: none Medication compliance: good compliance Aspirin: no Recent stressors: no Recurrent headaches: no Visual changes: no Palpitations: no Dyspnea: no Chest pain: no Lower extremity edema: no Dizzy/lightheaded: no   IFG: No current medications.  Last A1C one year ago was 6.0%. Polydipsia/polyuria: no Visual disturbance: no Chest pain: no Paresthesias: no  Relevant past medical, surgical, family and social history reviewed and updated as indicated. Interim medical history since our last visit reviewed. Allergies and medications reviewed and updated.  Review of Systems  Constitutional: Negative for activity change, diaphoresis, fatigue and fever.  Respiratory: Negative for cough, chest tightness, shortness of breath and wheezing.   Cardiovascular: Negative for chest pain, palpitations and leg swelling.  Gastrointestinal: Negative for abdominal distention, abdominal pain, constipation, diarrhea, nausea and vomiting.  Endocrine: Negative for cold intolerance, heat intolerance, polydipsia, polyphagia and polyuria.  Musculoskeletal: Negative.   Skin: Negative.   Neurological: Negative for dizziness, syncope, weakness, light-headedness, numbness and headaches.   Psychiatric/Behavioral: Negative.     Per HPI unless specifically indicated above     Objective:    BP 128/72 (BP Location: Left Arm, Patient Position: Sitting)   Pulse (!) 53   Temp 97.9 F (36.6 C) (Oral)   SpO2 98%   Wt Readings from Last 3 Encounters:  06/29/18 (!) 328 lb 3.2 oz (148.9 kg)  02/19/18 (!) 320 lb (145.2 kg)  01/25/18 (!) 329 lb (149.2 kg)    Physical Exam Vitals signs and nursing note reviewed.  Constitutional:      General: He is not in acute distress.    Appearance: He is well-developed. He is morbidly obese. He is not ill-appearing.  HENT:     Head: Normocephalic and atraumatic.     Right Ear: Hearing normal. No drainage.     Left Ear: Hearing normal. No drainage.     Mouth/Throat:     Pharynx: Uvula midline.  Eyes:     General: Lids are normal.        Right eye: No discharge.        Left eye: No discharge.     Conjunctiva/sclera: Conjunctivae normal.     Pupils: Pupils are equal, round, and reactive to light.  Neck:     Musculoskeletal: Normal range of motion and neck supple.     Thyroid: No thyromegaly.     Vascular: No carotid bruit or JVD.     Trachea: Trachea normal.  Cardiovascular:     Rate and Rhythm: Normal rate and regular rhythm.     Heart sounds: Normal heart sounds, S1 normal and S2 normal. No murmur. No gallop.   Pulmonary:     Effort: Pulmonary effort is normal. No accessory muscle usage or respiratory distress.     Breath sounds: Normal breath  sounds.  Abdominal:     General: Bowel sounds are normal.     Palpations: Abdomen is soft. There is no hepatomegaly or splenomegaly.  Musculoskeletal: Normal range of motion.     Right lower leg: Edema (trace) present.     Left lower leg: Edema (trace) present.  Skin:    General: Skin is warm and dry.     Capillary Refill: Capillary refill takes less than 2 seconds.     Findings: No rash.  Neurological:     Mental Status: He is alert and oriented to person, place, and time.     Deep  Tendon Reflexes: Reflexes are normal and symmetric.  Psychiatric:        Mood and Affect: Mood normal.        Behavior: Behavior normal.        Thought Content: Thought content normal.        Judgment: Judgment normal.     Results for orders placed or performed in visit on 01/25/18  Comprehensive metabolic panel  Result Value Ref Range   Glucose 104 (H) 65 - 99 mg/dL   BUN 10 6 - 24 mg/dL   Creatinine, Ser 1.01 0.76 - 1.27 mg/dL   GFR calc non Af Amer 82 >59 mL/min/1.73   GFR calc Af Amer 95 >59 mL/min/1.73   BUN/Creatinine Ratio 10 9 - 20   Sodium 142 134 - 144 mmol/L   Potassium 4.0 3.5 - 5.2 mmol/L   Chloride 104 96 - 106 mmol/L   CO2 23 20 - 29 mmol/L   Calcium 8.9 8.7 - 10.2 mg/dL   Total Protein 7.2 6.0 - 8.5 g/dL   Albumin 4.4 3.5 - 5.5 g/dL   Globulin, Total 2.8 1.5 - 4.5 g/dL   Albumin/Globulin Ratio 1.6 1.2 - 2.2   Bilirubin Total 0.5 0.0 - 1.2 mg/dL   Alkaline Phosphatase 90 39 - 117 IU/L   AST 33 0 - 40 IU/L   ALT 54 (H) 0 - 44 IU/L  Hemoglobin A1c  Result Value Ref Range   Hgb A1c MFr Bld 6.0 (H) 4.8 - 5.6 %   Est. average glucose Bld gHb Est-mCnc 126 mg/dL      Assessment & Plan:   Problem List Items Addressed This Visit      Cardiovascular and Mediastinum   Hypertension    Chronic, ongoing with initial BP elevated, but repeat below goal.  Continue current medication regimen and adjust as needed.  CMP today.        Relevant Orders   Comprehensive metabolic panel     Other   Hyperlipidemia    Chronic, ongoing.  Continue current medication regimen and adjust as needed.  CMP and lipid panel today.        Relevant Orders   Lipid Panel w/o Chol/HDL Ratio   Comprehensive metabolic panel   Morbid obesity ([place]) - Primary    Recommend continued focus on health diet choices and regular physical activity (30 minutes 5 days a week).      Prediabetes    A1C 5.9% today.  Recommend continued focus on diet and exercise.        Relevant Orders   Bayer  DCA Hb A1c Waived    Other Visit Diagnoses    Thyroid disorder screen       Check TSH   Relevant Orders   TSH       Follow up plan: Return in about 6 months (around 07/03/2019) for HTN/HLD, Prediabetes.

## 2018-12-31 NOTE — Assessment & Plan Note (Signed)
A1C 5.9% today.  Recommend continued focus on diet and exercise.

## 2018-12-31 NOTE — Assessment & Plan Note (Signed)
Chronic, ongoing with initial BP elevated, but repeat below goal.  Continue current medication regimen and adjust as needed.  CMP today.

## 2018-12-31 NOTE — Patient Instructions (Signed)

## 2018-12-31 NOTE — Assessment & Plan Note (Signed)
Recommend continued focus on health diet choices and regular physical activity (30 minutes 5 days a week). 

## 2019-01-01 LAB — COMPREHENSIVE METABOLIC PANEL
ALT: 50 IU/L — ABNORMAL HIGH (ref 0–44)
AST: 33 IU/L (ref 0–40)
Albumin/Globulin Ratio: 1.7 (ref 1.2–2.2)
Albumin: 4.6 g/dL (ref 3.8–4.9)
Alkaline Phosphatase: 88 IU/L (ref 39–117)
BUN/Creatinine Ratio: 12 (ref 9–20)
BUN: 12 mg/dL (ref 6–24)
Bilirubin Total: 0.6 mg/dL (ref 0.0–1.2)
CO2: 21 mmol/L (ref 20–29)
Calcium: 9.1 mg/dL (ref 8.7–10.2)
Chloride: 102 mmol/L (ref 96–106)
Creatinine, Ser: 0.97 mg/dL (ref 0.76–1.27)
GFR calc Af Amer: 99 mL/min/{1.73_m2} (ref >59)
GFR calc non Af Amer: 86 mL/min/{1.73_m2} (ref >59)
Globulin, Total: 2.7 g/dL (ref 1.5–4.5)
Glucose: 115 mg/dL — ABNORMAL HIGH (ref 65–99)
Potassium: 4.1 mmol/L (ref 3.5–5.2)
Sodium: 141 mmol/L (ref 134–144)
Total Protein: 7.3 g/dL (ref 6.0–8.5)

## 2019-01-01 LAB — LIPID PANEL W/O CHOL/HDL RATIO
Cholesterol, Total: 147 mg/dL (ref 100–199)
HDL: 39 mg/dL — ABNORMAL LOW (ref >39)
LDL Calculated: 70 mg/dL (ref 0–99)
Triglycerides: 192 mg/dL — ABNORMAL HIGH (ref 0–149)
VLDL Cholesterol Cal: 38 mg/dL (ref 5–40)

## 2019-01-01 LAB — TSH: TSH: 2.21 u[IU]/mL (ref 0.450–4.500)

## 2019-01-15 ENCOUNTER — Other Ambulatory Visit: Payer: Commercial Managed Care - PPO

## 2019-01-15 ENCOUNTER — Encounter: Payer: Self-pay | Admitting: Emergency Medicine

## 2019-01-15 ENCOUNTER — Inpatient Hospital Stay: Payer: Commercial Managed Care - PPO

## 2019-01-15 ENCOUNTER — Emergency Department: Payer: Commercial Managed Care - PPO

## 2019-01-15 ENCOUNTER — Inpatient Hospital Stay
Admit: 2019-01-15 | Discharge: 2019-01-15 | Disposition: A | Discharge status: Home / Self Care | Payer: Commercial Managed Care - PPO | Attending: Neurology | Admitting: Neurology

## 2019-01-15 ENCOUNTER — Inpatient Hospital Stay
Admission: EM | Admit: 2019-01-15 | Discharge: 2019-01-16 | DRG: 069 | Disposition: A | Discharge status: Home / Self Care | Payer: Commercial Managed Care - PPO | Attending: Internal Medicine | Admitting: Internal Medicine

## 2019-01-15 ENCOUNTER — Other Ambulatory Visit: Payer: Self-pay

## 2019-01-15 DIAGNOSIS — R471 Dysarthria and anarthria: Secondary | ICD-10-CM | POA: Diagnosis present

## 2019-01-15 DIAGNOSIS — I639 Cerebral infarction, unspecified: Secondary | ICD-10-CM | POA: Diagnosis present

## 2019-01-15 DIAGNOSIS — Z6841 Body Mass Index (BMI) 40.0 and over, adult: Secondary | ICD-10-CM | POA: Diagnosis not present

## 2019-01-15 DIAGNOSIS — R29701 NIHSS score 1: Secondary | ICD-10-CM | POA: Diagnosis present

## 2019-01-15 DIAGNOSIS — Z885 Allergy status to narcotic agent status: Secondary | ICD-10-CM

## 2019-01-15 DIAGNOSIS — I10 Essential (primary) hypertension: Secondary | ICD-10-CM | POA: Diagnosis present

## 2019-01-15 DIAGNOSIS — E662 Morbid (severe) obesity with alveolar hypoventilation: Secondary | ICD-10-CM | POA: Diagnosis present

## 2019-01-15 DIAGNOSIS — Z823 Family history of stroke: Secondary | ICD-10-CM | POA: Diagnosis not present

## 2019-01-15 DIAGNOSIS — Z79899 Other long term (current) drug therapy: Secondary | ICD-10-CM

## 2019-01-15 DIAGNOSIS — Z85828 Personal history of other malignant neoplasm of skin: Secondary | ICD-10-CM | POA: Diagnosis not present

## 2019-01-15 DIAGNOSIS — E876 Hypokalemia: Secondary | ICD-10-CM | POA: Diagnosis present

## 2019-01-15 DIAGNOSIS — R7303 Prediabetes: Secondary | ICD-10-CM | POA: Diagnosis present

## 2019-01-15 DIAGNOSIS — Z20828 Contact with and (suspected) exposure to other viral communicable diseases: Secondary | ICD-10-CM | POA: Diagnosis present

## 2019-01-15 DIAGNOSIS — Z8249 Family history of ischemic heart disease and other diseases of the circulatory system: Secondary | ICD-10-CM | POA: Diagnosis not present

## 2019-01-15 DIAGNOSIS — G459 Transient cerebral ischemic attack, unspecified: Secondary | ICD-10-CM | POA: Diagnosis present

## 2019-01-15 DIAGNOSIS — Z87891 Personal history of nicotine dependence: Secondary | ICD-10-CM

## 2019-01-15 DIAGNOSIS — Z8349 Family history of other endocrine, nutritional and metabolic diseases: Secondary | ICD-10-CM | POA: Diagnosis not present

## 2019-01-15 DIAGNOSIS — R001 Bradycardia, unspecified: Secondary | ICD-10-CM | POA: Diagnosis not present

## 2019-01-15 DIAGNOSIS — E785 Hyperlipidemia, unspecified: Secondary | ICD-10-CM | POA: Diagnosis present

## 2019-01-15 DIAGNOSIS — Z8673 Personal history of transient ischemic attack (TIA), and cerebral infarction without residual deficits: Secondary | ICD-10-CM | POA: Diagnosis present

## 2019-01-15 LAB — URINE DRUG SCREEN, QUALITATIVE (ARMC ONLY)
Amphetamines, Ur Screen: NOT DETECTED
Barbiturates, Ur Screen: NOT DETECTED
Benzodiazepine, Ur Scrn: NOT DETECTED
Cannabinoid 50 Ng, Ur ~~LOC~~: NOT DETECTED
Cocaine Metabolite,Ur ~~LOC~~: NOT DETECTED
MDMA (Ecstasy)Ur Screen: NOT DETECTED
Methadone Scn, Ur: NOT DETECTED
Opiate, Ur Screen: NOT DETECTED
Phencyclidine (PCP) Ur S: NOT DETECTED
Tricyclic, Ur Screen: NOT DETECTED

## 2019-01-15 LAB — DIFFERENTIAL
Abs Immature Granulocytes: 0.01 10*3/uL (ref 0.00–0.07)
Basophils Absolute: 0 10*3/uL (ref 0.0–0.1)
Basophils Relative: 0 %
Eosinophils Absolute: 0.1 10*3/uL (ref 0.0–0.5)
Eosinophils Relative: 3 %
Immature Granulocytes: 0 %
Lymphocytes Relative: 29 %
Lymphs Abs: 1.3 10*3/uL (ref 0.7–4.0)
Monocytes Absolute: 0.4 10*3/uL (ref 0.1–1.0)
Monocytes Relative: 9 %
Neutro Abs: 2.6 10*3/uL (ref 1.7–7.7)
Neutrophils Relative %: 59 %

## 2019-01-15 LAB — COMPREHENSIVE METABOLIC PANEL
ALT: 54 U/L — ABNORMAL HIGH (ref 0–44)
AST: 39 U/L (ref 15–41)
Albumin: 3.9 g/dL (ref 3.5–5.0)
Alkaline Phosphatase: 71 U/L (ref 38–126)
Anion gap: 11 (ref 5–15)
BUN: 16 mg/dL (ref 6–20)
CO2: 25 mmol/L (ref 22–32)
Calcium: 8.7 mg/dL — ABNORMAL LOW (ref 8.9–10.3)
Chloride: 104 mmol/L (ref 98–111)
Creatinine, Ser: 0.89 mg/dL (ref 0.61–1.24)
GFR calc Af Amer: >60 mL/min (ref >60)
GFR calc non Af Amer: >60 mL/min (ref >60)
Glucose, Bld: 152 mg/dL — ABNORMAL HIGH (ref 70–99)
Potassium: 3.3 mmol/L — ABNORMAL LOW (ref 3.5–5.1)
Sodium: 140 mmol/L (ref 135–145)
Total Bilirubin: 0.7 mg/dL (ref 0.3–1.2)
Total Protein: 7.3 g/dL (ref 6.5–8.1)

## 2019-01-15 LAB — CBC
HCT: 41.4 % (ref 39.0–52.0)
Hemoglobin: 13.8 g/dL (ref 13.0–17.0)
MCH: 28.6 pg (ref 26.0–34.0)
MCHC: 33.3 g/dL (ref 30.0–36.0)
MCV: 85.9 fL (ref 80.0–100.0)
Platelets: 189 10*3/uL (ref 150–400)
RBC: 4.82 MIL/uL (ref 4.22–5.81)
RDW: 13.9 % (ref 11.5–15.5)
WBC: 4.5 10*3/uL (ref 4.0–10.5)
nRBC: 0 % (ref 0.0–0.2)

## 2019-01-15 LAB — HEMOGLOBIN A1C
Hgb A1c MFr Bld: 5.8 % — ABNORMAL HIGH (ref 4.8–5.6)
Mean Plasma Glucose: 119.76 mg/dL

## 2019-01-15 LAB — URINALYSIS, ROUTINE W REFLEX MICROSCOPIC
Bilirubin Urine: NEGATIVE
Glucose, UA: NEGATIVE mg/dL
Hgb urine dipstick: NEGATIVE
Ketones, ur: NEGATIVE mg/dL
Leukocytes,Ua: NEGATIVE
Nitrite: NEGATIVE
Protein, ur: NEGATIVE mg/dL
Specific Gravity, Urine: 1.019 (ref 1.005–1.030)
pH: 7 (ref 5.0–8.0)

## 2019-01-15 LAB — LIPID PANEL
Cholesterol: 145 mg/dL (ref 0–200)
HDL: 38 mg/dL — ABNORMAL LOW (ref >40)
LDL Cholesterol: 81 mg/dL (ref 0–99)
Total CHOL/HDL Ratio: 3.8 RATIO
Triglycerides: 132 mg/dL (ref <150)
VLDL: 26 mg/dL (ref 0–40)

## 2019-01-15 LAB — APTT: aPTT: 30 seconds (ref 24–36)

## 2019-01-15 LAB — PROTIME-INR
INR: 1 (ref 0.8–1.2)
Prothrombin Time: 12.8 seconds (ref 11.4–15.2)

## 2019-01-15 LAB — SARS CORONAVIRUS 2 BY RT PCR (HOSPITAL ORDER, PERFORMED IN ~~LOC~~ HOSPITAL LAB): SARS Coronavirus 2: NEGATIVE

## 2019-01-15 LAB — GLUCOSE, CAPILLARY: Glucose-Capillary: 142 mg/dL — ABNORMAL HIGH (ref 70–99)

## 2019-01-15 LAB — ETHANOL: Alcohol, Ethyl (B): <10 mg/dL (ref <10)

## 2019-01-15 MED ORDER — ALTEPLASE 100 MG IV SOLR
INTRAVENOUS | Status: AC
  Filled 2019-01-15: qty 100

## 2019-01-15 MED ORDER — POTASSIUM CHLORIDE CRYS ER 20 MEQ PO TBCR
20.0000 meq | EXTENDED_RELEASE_TABLET | Freq: Two times a day (BID) | ORAL | Status: AC
  Administered 2019-01-15: 13:00:00 20 meq via ORAL
  Filled 2019-01-15: qty 1

## 2019-01-15 MED ORDER — SODIUM CHLORIDE 0.9 % IV BOLUS
500.0000 mL | Freq: Once | INTRAVENOUS | Status: AC
  Administered 2019-01-15: 500 mL via INTRAVENOUS

## 2019-01-15 MED ORDER — ENOXAPARIN SODIUM 40 MG/0.4ML ~~LOC~~ SOLN
40.0000 mg | SUBCUTANEOUS | Status: DC
  Administered 2019-01-15: 21:00:00 40 mg via SUBCUTANEOUS
  Filled 2019-01-15: qty 0.4

## 2019-01-15 MED ORDER — SENNOSIDES-DOCUSATE SODIUM 8.6-50 MG PO TABS: 1.0000 | ORAL_TABLET | Freq: Every evening | ORAL | Status: DC | PRN

## 2019-01-15 MED ORDER — IOHEXOL 350 MG/ML SOLN
75.0000 mL | Freq: Once | INTRAVENOUS | Status: AC | PRN
  Administered 2019-01-15: 75 mL via INTRAVENOUS

## 2019-01-15 MED ORDER — ATORVASTATIN CALCIUM 20 MG PO TABS
10.0000 mg | ORAL_TABLET | Freq: Every day | ORAL | Status: DC
  Administered 2019-01-15: 20:00:00 10 mg via ORAL
  Filled 2019-01-15: qty 1

## 2019-01-15 MED ORDER — SODIUM CHLORIDE 0.9 % IV SOLN: INTRAVENOUS | Status: DC

## 2019-01-15 MED ORDER — IRBESARTAN 150 MG PO TABS
300.0000 mg | ORAL_TABLET | Freq: Every day | ORAL | Status: DC
  Administered 2019-01-15: 300 mg via ORAL
  Filled 2019-01-15 (×2): qty 2

## 2019-01-15 MED ORDER — CLOPIDOGREL BISULFATE 75 MG PO TABS
75.0000 mg | ORAL_TABLET | Freq: Every day | ORAL | Status: DC
  Administered 2019-01-15 – 2019-01-16 (×2): 75 mg via ORAL
  Filled 2019-01-15 (×2): qty 1

## 2019-01-15 MED ORDER — PERFLUTREN LIPID MICROSPHERE
1.0000 mL | INTRAVENOUS | Status: AC | PRN
  Administered 2019-01-15: 2 mL via INTRAVENOUS
  Filled 2019-01-15: qty 10

## 2019-01-15 MED ORDER — AMLODIPINE BESYLATE 10 MG PO TABS
10.0000 mg | ORAL_TABLET | Freq: Every day | ORAL | Status: DC
  Administered 2019-01-15: 21:00:00 10 mg via ORAL
  Filled 2019-01-15: qty 1

## 2019-01-15 MED ORDER — CYCLOBENZAPRINE HCL 10 MG PO TABS
10.0000 mg | ORAL_TABLET | Freq: Every day | ORAL | Status: DC
  Administered 2019-01-15: 10 mg via ORAL
  Filled 2019-01-15: qty 1

## 2019-01-15 MED ORDER — ASPIRIN 81 MG PO CHEW
324.0000 mg | CHEWABLE_TABLET | Freq: Once | ORAL | Status: AC
  Administered 2019-01-15: 324 mg via ORAL
  Filled 2019-01-15: qty 4

## 2019-01-15 MED ORDER — STROKE: EARLY STAGES OF RECOVERY BOOK
Freq: Once | Status: AC
  Administered 2019-01-15: 17:00:00

## 2019-01-15 NOTE — Evaluation (Signed)
Occupational Therapy Evaluation Patient Details Name: Paul Cannon MRN: [id number] DOB: Nov 14, 1960 Today's Date: 01/15/2019    History of Present Illness presented to ER secondary to acute onset of R UE numbness, dis-coordination, speech difficulty and mild facial droop; admitted for TIA/CVA work up.  CT negative for acute intracranial process; MRI pending.  Initial NIHSS 1 upon arrival; reports symptoms fully resolved at this time.   Clinical Impression   Mr. Gerald Stabs" Friar was seen for OT evaluation this date. Prior to hospital admission, pt was independent in all aspects of ADL/IADLand mobility. He denies falls history in past 12 months. Pt lives with his spouse in a 2 story home with 3 steps to enter. Pt states bedroom and bathrooms are on the second floor of his home. Currently pt reporting symptoms have resolved. Pt demonstrates baseline independence to perform ADL and mobility tasks and no strength, sensory, coordination, cognitive, or visual deficits appreciated with assessment. No skilled OT needs identified. Will sign off. Please re-consult if additional OT needs arise.     Follow Up Recommendations  No OT follow up    Equipment Recommendations  None recommended by OT    Recommendations for Other Services       Precautions / Restrictions Precautions Precautions: None      Mobility Bed Mobility Overal bed mobility: Independent                Transfers Overall transfer level: Independent Equipment used: None             General transfer comment: good LE power and control with movement transition    Balance Overall balance assessment: Modified Independent;No apparent balance deficits (not formally assessed)                                         ADL either performed or assessed with clinical judgement   ADL Overall ADL's : At baseline                                       General ADL Comments: Pt reporting  feeling back to baseline functional independence. Has been up to room commode without difficulty.     Vision         Perception     Praxis      Pertinent Vitals/Pain Pain Assessment: No/denies pain     Hand Dominance Right   Extremity/Trunk Assessment Upper Extremity Assessment Upper Extremity Assessment: Overall WFL for tasks assessed(Strength and sensation WFL. Grossly 4+/5. No numbness, tingling, etc. Pt overall reporting RUE symptoms have resolved.)   Lower Extremity Assessment Lower Extremity Assessment: Defer to PT evaluation;Overall [facility] for tasks assessed   Cervical / Trunk Assessment Cervical / Trunk Assessment: Normal   Communication Communication Communication: No difficulties(Speech clear, no difficulties, pt reporting symptoms have resolved)   Cognition Arousal/Alertness: Awake/alert Behavior During Therapy: WFL for tasks assessed/performed Overall Cognitive Status: Within Functional Limits for tasks assessed                                     General Comments  static and dynamic standing balance WFL; good, age-appropriate functional reach    Exercises     Shoulder Instructions      Home Living  Family/patient expects to be discharged to:: Private residence Living Arrangements: Spouse/significant other Available Help at Discharge: Family;Available 24 hours/day Type of Home: House Home Access: Stairs to enter [employer] of Steps: 3   Home Layout: Two level;Bed/bath upstairs Alternate Level Stairs-Number of Steps: Flight             Home Equipment: None          Prior Functioning/Environment Level of Independence: Independent        Comments: Indep with ADLs, household and community mobilization without assist device; 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[employer])        OT Problem List: Impaired sensation;Impaired UE functional use;Decreased knowledge of use of DME or AE;Decreased  safety awareness;Decreased strength;Decreased coordination      OT Treatment/Interventions:      OT Goals(Current goals can be found in the care plan section) Acute Rehab OT Goals Patient Stated Goal: to return home OT Goal Formulation: All assessment and education complete, DC therapy Time For Goal Achievement: 01/15/19 Potential to Achieve Goals: Good  OT Frequency:     Barriers to D/C:            Co-evaluation              AM-PAC OT "6 Clicks" Daily Activity     Outcome Measure Help from another person eating meals?: None Help from another person taking care of personal grooming?: None Help from another person toileting, which includes using toliet, bedpan, or urinal?: None Help from another person bathing (including washing, rinsing, drying)?: None Help from another person to put on and taking off regular upper body clothing?: None Help from another person to put on and taking off regular lower body clothing?: None 6 Click Score: 24   End of Session    Activity Tolerance: Patient tolerated treatment well Patient left: in bed;with call bell/phone within reach;with bed alarm set;with family/visitor present  OT Visit Diagnosis: Other abnormalities of gait and mobility (R26.89);Other symptoms and signs involving the nervous system (R29.898)                Time: 8325-4982 OT Time Calculation (min): 12 min Charges:  OT General Charges $OT Visit: 1 Visit OT Evaluation $OT Eval Low Complexity: [place], M.S., OTR/L Ascom: [phone] 01/15/19, 1:47 PM

## 2019-01-15 NOTE — ED Notes (Signed)
CODE STROKE CALLED TO [phone] 

## 2019-01-15 NOTE — Evaluation (Signed)
Physical Therapy Evaluation Patient Details Name: Paul Cannon MRN: [id number] DOB: 06/12/1960 Today's Date: 01/15/2019   History of Present Illness  presented to ER secondary to acute onset of R UE numbness, dis-coordination, speech difficulty and mild facial droop; admitted for TIA/CVA work up.  CT negative for acute intracranial process; MRI pending.  Initial NIHSS 1 upon arrival; reports symptoms fully resolved at this time.  Clinical Impression  Upon evaluation, patient alert and oriented; follows commands and demonstrates good effort with all mobility tasks.  Speech clear, fluent and intelligible; no gross speech difficulties noted.  Does report feeling "like I have gauze in my right cheek" at times; SLP evaluation pending.  Bilat UE/LE strength and ROM grossly symmetrical and WFL; no focal weakness, sensory or coordination deficit appreciated.  Able to complete transitional movements, dynamic balance tasks and dynamic gait components without difficulty, LOB or safety concern.  Performs all functional mobility (sit/stand, basic transfers and gait (200')) without assist device, indep.  Good comfort, confidence and overall stability.  Subjectively reports (and wife confirms) full return to baseline ability. No acute PT needs identified at this time.  Will complete initial order; please re-consult should needs change.    Follow Up Recommendations No PT follow up    Equipment Recommendations       Recommendations for Other Services       Precautions / Restrictions Precautions Precautions: None      Mobility  Bed Mobility Overal bed mobility: Independent                Transfers Overall transfer level: Independent Equipment used: None             General transfer comment: good LE power and control with movement transition  Ambulation/Gait Ambulation/Gait assistance: Modified independent (Device/Increase time) Gait Distance (Feet): 200 Feet Assistive device:  None       General Gait Details: reciprocal stepping pattern, symmetrical gait pattern with good mechanics, dynamic balance and overall control/stability.  Easily completes head turns, start/stop and changes of direction without difficulty, LOB or gait deviation.  Stairs            Wheelchair Mobility    Modified Rankin (Stroke Patients Only)       Balance Overall balance assessment: Modified Independent                                           Pertinent Vitals/Pain Pain Assessment: No/denies pain    Home Living Family/patient expects to be discharged to:: Private residence Living Arrangements: Spouse/significant other Available Help at Discharge: Family Type of Home: House         Home Equipment: None      Prior Function Level of Independence: Independent         Comments: Indep with ADLs, household and community mobilization without assist device; 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, newspaper   Dominant Hand: Right    Extremity/Trunk Assessment   Upper Extremity Assessment Upper Extremity Assessment: Overall WFL for tasks assessed    Lower Extremity Assessment Lower Extremity Assessment: Overall WFL for tasks assessed(grossly at least 4+ to 5/5 throughout; no focal weakness, coordination or sensory deficit noted)       Communication   Communication: No difficulties(speech clear and fluent, easily makes needs known and communicates needs; does report feeling "like I have gauze  in the right side of my cheek")  Cognition Arousal/Alertness: Awake/alert Behavior During Therapy: WFL for tasks assessed/performed Overall Cognitive Status: Within Functional Limits for tasks assessed                                        General Comments General comments (skin integrity, edema, etc.): static and dynamic standing balance WFL; good, age-appropriate functional reach     Exercises     Assessment/Plan    PT Assessment Patent does not need any further PT services  PT Problem List         PT Treatment Interventions      PT Goals (Current goals can be found in the Care Plan section)  Acute Rehab PT Goals Patient Stated Goal: to return home PT Goal Formulation: With patient/family Time For Goal Achievement: 01/15/19 Potential to Achieve Goals: Good    Frequency     Barriers to discharge        Co-evaluation               AM-PAC PT "6 Clicks" Mobility  Outcome Measure Help needed turning from your back to your side while in a flat bed without using bedrails?: None Help needed moving from lying on your back to sitting on the side of a flat bed without using bedrails?: None Help needed moving to and from a bed to a chair (including a wheelchair)?: None Help needed standing up from a chair using your arms (e.g., wheelchair or bedside chair)?: None Help needed to walk in hospital room?: None Help needed climbing 3-5 steps with a railing? : None 6 Click Score: 24    End of Session Equipment Utilized During Treatment: Gait belt Activity Tolerance: Patient tolerated treatment well Patient left: in bed;with call bell/phone within reach;with bed alarm set;with family/visitor present Nurse Communication: Mobility status PT Visit Diagnosis: Hemiplegia and hemiparesis Hemiplegia - Right/Left: Right Hemiplegia - dominant/non-dominant: Dominant Hemiplegia - caused by: Cerebral infarction    Time: 1057-1107 PT Time Calculation (min) (ACUTE ONLY): 10 min   Charges:   PT Evaluation $PT Eval Moderate Complexity: 1 Mod          Aarika Moon H. Owens Shark, PT, DPT, NCS 01/15/19, 1:28 PM [phone]

## 2019-01-15 NOTE — ED Provider Notes (Signed)
[facility] Emergency Department Provider Note    First MD Initiated Contact with Patient 01/15/19 0732     (approximate)  I have reviewed the triage vital signs and the nursing notes.   HISTORY  Chief Complaint Code Stroke    HPI Paul Cannon is a 58 y.o. male presents the ER for evaluation of difficulty finding words as well as right-sided weakness and numbness that started around 615 this morning.  States that he woke up at 5 AM to talk to the [place] and was feeling normal at that time.  Denies any personal history of known stroke.  States that symptoms lasted roughly an hour.  Feels that symptoms are somewhat improving at this time.  Denies any history of dysrhythmia.  Denies any recent fevers.  No cough or congestion.  Blood sugar was checked in triage which was normal.  States that he still having some minimal difficulty finding words but he has fluent speech at this time.    Past Medical History:  Diagnosis Date  . Hyperlipidemia   . Hypertension   . Obesity   . Sleep apnea    Family History  Problem Relation Age of Onset  . Arthritis Mother   . Hyperlipidemia Mother   . Hypertension Mother   . Migraines Mother   . Heart disease Father        MI  . Stroke Father   . Lung disease Father   . Cancer Sister        breast  . Cancer Brother        melanoma  . Stroke Sister    Past Surgical History:  Procedure Laterality Date  . COLONOSCOPY WITH PROPOFOL N/A 11/17/2015   Procedure: COLONOSCOPY WITH PROPOFOL;  Surgeon: [name], MD;  Location: [facility] ENDOSCOPY;  Service: Endoscopy;  Laterality: N/A;  . SHOULDER SURGERY     Patient Active Problem List   Diagnosis Date Noted  . 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 Prediabetes 06/28/2018  . Other specified diseases of anus and rectum   . Special screening for malignant neoplasms, colon   . Neoplasm of digestive system   . Benign neoplasm of transverse colon   . 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 IFG (impaired fasting glucose) 03/03/2015  . Sleep apnea 03/02/2015  . Hypertension 03/02/2015  . Hyperlipidemia 03/02/2015  . Morbid obesity ([place]) 03/02/2015  . Personal history of other malignant neoplasm of skin 12/05/2013      Prior to Admission medications   Medication Sig Start Date End Date Taking? Authorizing Provider  amLODipine (NORVASC) 10 MG tablet TAKE 1 TABLET BY MOUTH ONCE DAILY Patient taking differently: Take 10 mg by mouth daily.  10/20/18  Yes Cannady, Jolene T, NP  atorvastatin (LIPITOR) 10 MG tablet TAKE 1 TABLET BY MOUTH ONCE DAILY Patient taking differently: Take 10 mg by mouth daily at 6 PM.  10/20/18  Yes Cannady, Jolene T, NP  cyclobenzaprine (FLEXERIL) 10 MG tablet TAKE 1 TABLET BY MOUTH AT BEDTIME Patient taking differently: Take 10 mg by mouth at bedtime.  08/30/18  Yes Cannady, Jolene T, NP  Multiple Vitamins-Minerals (ONE-A-DAY MENS 50+ ADVANTAGE PO) Take 1 tablet by mouth daily.   Yes [provider]  telmisartan (MICARDIS) 80 MG tablet TAKE 1 TABLET BY MOUTH ONCE DAILY Patient taking differently: Take 80 mg by mouth daily.  12/27/18  Yes Cannady, Jolene T, NP  methocarbamol (ROBAXIN) 750 MG tablet Take 1 tablet (750 mg total) by mouth every  6 (six) hours as needed. Patient not taking: Reported on 01/15/2019 10/13/15   Kathrine Haddock, NP    Allergies Percocet [oxycodone-acetaminophen] and Vicodin [hydrocodone-acetaminophen]    Social History Social History   Tobacco Use  . Smoking status: Former Smoker    Quit date: 09/30/2009    Years since quitting: 9.2  . Smokeless tobacco: Former Network engineer Use Topics  . Alcohol use: Yes    Comment: pt states occasional or very rarely  . Drug use: No    Review of Systems Patient denies headaches, rhinorrhea, blurry vision, numbness, shortness of breath, chest pain, edema, cough, abdominal pain, nausea, vomiting, diarrhea, dysuria, fevers, rashes or hallucinations unless  otherwise stated above in HPI. ____________________________________________   PHYSICAL EXAM:  VITAL SIGNS: Vitals:   01/15/19 1019 01/15/19 1255  BP: (!) 159/73 (!) 159/88  Pulse: (!) 52 (!) 53  Resp: 18 18  Temp: (!) 97.5 F (36.4 C)   SpO2: 99% 98%    Constitutional: Alert and oriented.  Eyes: Conjunctivae are normal.  Head: Atraumatic. Nose: No congestion/rhinnorhea. Mouth/Throat: Mucous membranes are moist.   Neck: No stridor. Painless ROM.  Cardiovascular: Normal rate, regular rhythm. Grossly normal heart sounds.  Good peripheral circulation. Respiratory: Normal respiratory effort.  No retractions. Lungs CTAB. Gastrointestinal: Soft and nontender. No distention. No abdominal bruits. No CVA tenderness. Genitourinary:  Musculoskeletal: No lower extremity tenderness nor edema.  No joint effusions. Neurologic:  CN- intact.  No facial droop, Normal FNF.  Normal heel to shin.  Sensation intact bilaterally. Normal speech and language. No gross focal neurologic deficits are appreciated. No gait instability.  Skin:  Skin is warm, dry and intact. No rash noted. Psychiatric: Mood and affect are normal. Speech and behavior are normal.  ____________________________________________   LABS (all labs ordered are listed, but only abnormal results are displayed)  Results for orders placed or performed during the hospital encounter of 01/15/19 (from the past 24 hour(s))  Glucose, capillary     Status: Abnormal   Collection Time: 01/15/19  7:32 AM  Result Value Ref Range   Glucose-Capillary 142 (H) 70 - 99 mg/dL  Ethanol     Status: None   Collection Time: 01/15/19  7:54 AM  Result Value Ref Range   Alcohol, Ethyl (B) <10 <10 mg/dL  Protime-INR     Status: None   Collection Time: 01/15/19  7:54 AM  Result Value Ref Range   Prothrombin Time 12.8 11.4 - 15.2 seconds   INR 1.0 0.8 - 1.2  APTT     Status: None   Collection Time: 01/15/19  7:54 AM  Result Value Ref Range   aPTT 30  24 - 36 seconds  CBC     Status: None   Collection Time: 01/15/19  7:54 AM  Result Value Ref Range   WBC 4.5 4.0 - 10.5 K/uL   RBC 4.82 4.22 - 5.81 MIL/uL   Hemoglobin 13.8 13.0 - 17.0 g/dL   HCT 41.4 39.0 - 52.0 %   MCV 85.9 80.0 - 100.0 fL   MCH 28.6 26.0 - 34.0 pg   MCHC 33.3 30.0 - 36.0 g/dL   RDW 13.9 11.5 - 15.5 %   Platelets 189 150 - 400 K/uL   nRBC 0.0 0.0 - 0.2 %  Differential     Status: None   Collection Time: 01/15/19  7:54 AM  Result Value Ref Range   Neutrophils Relative % 59 %   Neutro Abs 2.6 1.7 - 7.7 K/uL  Lymphocytes Relative 29 %   Lymphs Abs 1.3 0.7 - 4.0 K/uL   Monocytes Relative 9 %   Monocytes Absolute 0.4 0.1 - 1.0 K/uL   Eosinophils Relative 3 %   Eosinophils Absolute 0.1 0.0 - 0.5 K/uL   Basophils Relative 0 %   Basophils Absolute 0.0 0.0 - 0.1 K/uL   Immature Granulocytes 0 %   Abs Immature Granulocytes 0.01 0.00 - 0.07 K/uL  Comprehensive metabolic panel     Status: Abnormal   Collection Time: 01/15/19  7:54 AM  Result Value Ref Range   Sodium 140 135 - 145 mmol/L   Potassium 3.3 (L) 3.5 - 5.1 mmol/L   Chloride 104 98 - 111 mmol/L   CO2 25 22 - 32 mmol/L   Glucose, Bld 152 (H) 70 - 99 mg/dL   BUN 16 6 - 20 mg/dL   Creatinine, Ser 0.89 0.61 - 1.24 mg/dL   Calcium 8.7 (L) 8.9 - 10.3 mg/dL   Total Protein 7.3 6.5 - 8.1 g/dL   Albumin 3.9 3.5 - 5.0 g/dL   AST 39 15 - 41 U/L   ALT 54 (H) 0 - 44 U/L   Alkaline Phosphatase 71 38 - 126 U/L   Total Bilirubin 0.7 0.3 - 1.2 mg/dL   GFR calc non Af Amer >60 >60 mL/min   GFR calc Af Amer >60 >60 mL/min   Anion gap 11 5 - 15  SARS Coronavirus 2 [facility] order, Performed in [place] hospital lab) Nasopharyngeal Nasopharyngeal Swab     Status: None   Collection Time: 01/15/19  7:54 AM   Specimen: Nasopharyngeal Swab  Result Value Ref Range   SARS Coronavirus 2 NEGATIVE NEGATIVE  Lipid panel     Status: Abnormal   Collection Time: 01/15/19  7:54 AM  Result Value Ref Range   Cholesterol 145 0  - 200 mg/dL   Triglycerides 132 <150 mg/dL   HDL 38 (L) >40 mg/dL   Total CHOL/HDL Ratio 3.8 RATIO   VLDL 26 0 - 40 mg/dL   LDL Cholesterol 81 0 - 99 mg/dL   ____________________________________________  EKG My review and personal interpretation at Time: 7:44   Indication: tia  Rate: 60  Rhythm: sinus Axis: normal Other: normal intervals, no stemi ____________________________________________  RADIOLOGY  I personally reviewed all radiographic images ordered to evaluate for the above acute complaints and reviewed radiology reports and findings.  These findings were personally discussed with the patient.  Please see medical record for radiology report.  ____________________________________________   PROCEDURES  Procedure(s) performed:  Procedures    Critical Care performed: no ____________________________________________   INITIAL IMPRESSION / ASSESSMENT AND PLAN / ED COURSE  Pertinent labs & imaging results that were available during my care of the patient were reviewed by me and considered in my medical decision making (see chart for details).   DDX: cva, tia, hypoglycemia, dehydration, electrolyte abnormality, dissection, sepsis   Regina Coppolino is a 58 y.o. who presents to the ED with symptoms as described above.  Seems to be improving already but does still have some word finding difficulty therefore code stroke was called.  Symptoms fairly minimal but is within the window does have multiple risk factors.  Glucose nml.  Stat ct ordered.  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  His symptoms seem to be significantly improving at this point and is  currently undergoing code stroke consultation with neurology.  At this point seems more consistent with TIA.  Patient will require admission for further medical management.   [PR]  [phone] Case discussed with tele-neurology.   Symptoms mild at this point.  Was offered TPA but patient declining at this point.  Will proceed with aspirin.  Has recommended CTA to further evaluate.   [PR]    Clinical Course User Index [PR] Merlyn Lot, MD    The patient was evaluated in Emergency Department today for the symptoms described in the history of present illness. He/she was evaluated in the context of the global COVID-19 pandemic, which necessitated consideration that the patient might be at risk for infection with the SARS-CoV-2 virus that causes COVID-19. Institutional protocols and algorithms that pertain to the evaluation of patients at risk for COVID-19 are in a state of rapid change based on information released by regulatory bodies including the CDC and federal and state organizations. These policies and algorithms were followed during the patient's care in the ED.  As part of my medical decision making, I reviewed the following data within the [place] notes reviewed and incorporated, Labs reviewed, notes from prior ED visits and [place] Controlled Substance Database   ____________________________________________   FINAL CLINICAL IMPRESSION(S) / ED DIAGNOSES  Final diagnoses:  TIA (transient ischemic attack)      NEW MEDICATIONS STARTED DURING THIS VISIT:  Current Discharge Medication List       Note:  This document was prepared using Dragon voice recognition software and may include unintentional dictation errors.    Merlyn Lot, MD 01/15/19 [phone]

## 2019-01-15 NOTE — Consult Note (Signed)
Referring Physician: Stark Klein    Chief Complaint: Difficulty with speech  HPI: Paul Cannon is an 58 y.o. male who reports awakening this morning and not feeling right.  When he finally got up and went to the bathroom and noticed that his right hand was shaking and difficulty with coordination.  He tried to use the toothbrush but noticed that he was having some difficulty.  Patient states he proceeded to take a bath and while showering he was making " unusual gibberish sound".  Patient states when he got out of the shower he felt a warm rash all over his body associated with nausea without vomiting.  He went to tell his wife that he was leaving but  he noticed that he was having difficulty getting his words out.  Patient's wife also noticed that he was slurring his speech with right facial droop they therefore decided to come to the ED for further evaluation.  Symptoms improved once at the ED with NIHSS of 1.    Date last known well: Date: 01/14/2019 Time last known well: Time: 23:00 tPA Given: No: Outside time window, improvement in symptoms  Past Medical History:  Diagnosis Date  . Hyperlipidemia   . Hypertension   . Obesity   . Sleep apnea     Past Surgical History:  Procedure Laterality Date  . COLONOSCOPY WITH PROPOFOL N/A 11/17/2015   Procedure: COLONOSCOPY WITH PROPOFOL;  Surgeon: [name], MD;  Location: [facility] ENDOSCOPY;  Service: Endoscopy;  Laterality: N/A;  . SHOULDER SURGERY      Family History  Problem Relation Age of Onset  . Arthritis Mother   . Hyperlipidemia Mother   . Hypertension Mother   . Migraines Mother   . Heart disease Father        MI  . Stroke Father   . Lung disease Father   . Cancer Sister        breast  . Cancer Brother        melanoma  . Stroke Sister    Social History:  reports that he quit smoking about 9 years ago. He has quit using smokeless tobacco. He reports current alcohol use. He reports that he does not use drugs.  Allergies:   Allergies  Allergen Reactions  . Percocet [Oxycodone-Acetaminophen] Diarrhea and Nausea And Vomiting  . Vicodin [Hydrocodone-Acetaminophen] Diarrhea and Nausea And Vomiting    Medications:  I have reviewed the patient's current medications. Prior to Admission:  Medications Prior to Admission  Medication Sig Dispense Refill Last Dose  . amLODipine (NORVASC) 10 MG tablet TAKE 1 TABLET BY MOUTH ONCE DAILY (Patient taking differently: Take 10 mg by mouth daily. ) 90 tablet 0 01/14/2019 at 0600  . atorvastatin (LIPITOR) 10 MG tablet TAKE 1 TABLET BY MOUTH ONCE DAILY (Patient taking differently: Take 10 mg by mouth daily at 6 PM. ) 90 tablet 0 01/14/2019 at 1800  . cyclobenzaprine (FLEXERIL) 10 MG tablet TAKE 1 TABLET BY MOUTH AT BEDTIME (Patient taking differently: Take 10 mg by mouth at bedtime. ) 30 tablet 5 01/14/2019 at 2100  . Multiple Vitamins-Minerals (ONE-A-DAY MENS 50+ ADVANTAGE PO) Take 1 tablet by mouth daily.   01/14/2019 at 0600  . telmisartan (MICARDIS) 80 MG tablet TAKE 1 TABLET BY MOUTH ONCE DAILY (Patient taking differently: Take 80 mg by mouth daily. ) 30 tablet 0 01/14/2019 at 0600  . methocarbamol (ROBAXIN) 750 MG tablet Take 1 tablet (750 mg total) by mouth every 6 (six) hours as needed. (Patient not  taking: Reported on 01/15/2019) 30 tablet 1 Completed Course at Unknown time   Scheduled: .  stroke: mapping our early stages of recovery book   Does not apply Once  . amLODipine  10 mg Oral Daily  . atorvastatin  10 mg Oral q1800  . cyclobenzaprine  10 mg Oral QHS  . enoxaparin (LOVENOX) injection  40 mg Subcutaneous Q24H  . irbesartan  300 mg Oral Daily  . potassium chloride  20 mEq Oral BID    ROS: History obtained from the patient  General ROS: negative for - chills, fatigue, fever, night sweats, weight gain or weight loss Psychological ROS: negative for - behavioral disorder, hallucinations, memory difficulties, mood swings or suicidal ideation Ophthalmic ROS: negative  for - blurry vision, double vision, eye pain or loss of vision ENT ROS: negative for - epistaxis, nasal discharge, oral lesions, sore throat, tinnitus or vertigo Allergy and Immunology ROS: negative for - hives or itchy/watery eyes Hematological and Lymphatic ROS: negative for - bleeding problems, bruising or swollen lymph nodes Endocrine ROS: negative for - galactorrhea, hair pattern changes, polydipsia/polyuria or temperature intolerance Respiratory ROS: negative for - cough, hemoptysis, shortness of breath or wheezing Cardiovascular ROS: negative for - chest pain, dyspnea on exertion, edema or irregular heartbeat Gastrointestinal ROS: negative for - abdominal pain, diarrhea, hematemesis, nausea/vomiting or stool incontinence Genito-Urinary ROS: negative for - dysuria, hematuria, incontinence or urinary frequency/urgency Musculoskeletal ROS: negative for - joint swelling or muscular weakness Neurological ROS: as noted in HPI Dermatological ROS: negative for rash and skin lesion changes  Physical Examination: Blood pressure (!) 159/73, pulse (!) 52, temperature (!) 97.5 F (36.4 C), resp. rate 18, height 6' (1.829 m), weight (!) 146.7 kg, SpO2 99 %.  HEENT-  Normocephalic, no lesions, without obvious abnormality.  Normal external eye and conjunctiva.  Normal TM's bilaterally.  Normal auditory canals and external ears. Normal external nose, mucus membranes and septum.  Normal pharynx. Cardiovascular- S1, S2 normal, pulses palpable throughout   Lungs- chest clear, no wheezing, rales, normal symmetric air entry Abdomen- soft, non-tender; bowel sounds normal; no masses,  no organomegaly Extremities- no edema Lymph-no adenopathy palpable Musculoskeletal-no joint tenderness, deformity or swelling Skin-warm and dry, no hyperpigmentation, vitiligo, or suspicious lesions  Neurological Examination   Mental Status: Alert, oriented, thought content appropriate.  Speech fluent without evidence of  aphasia.  Able to follow 3 step commands without difficulty. Cranial Nerves: II: Discs flat bilaterally; Visual fields grossly normal, pupils equal, round, reactive to light and accommodation III,IV, VI: ptosis not present, extra-ocular motions intact bilaterally V,VII: smile symmetric, facial light touch sensation normal bilaterally VIII: hearing normal bilaterally IX,X: gag reflex present XI: bilateral shoulder shrug XII: midline tongue extension Motor: Right : Upper extremity   5/5    Left:     Upper extremity   5/5  Lower extremity   5/5     Lower extremity   5/5 Tone and bulk:normal tone throughout; no atrophy noted Sensory: Pinprick and light touch intact throughout, bilaterally Deep Tendon Reflexes: Symmetric throughout Plantars: Right: downgoing   Left: downgoing Cerebellar: Normal finger-to-nose and normal heel-to-shin testing bilaterally Gait: not tested due to safety concerns   Laboratory Studies:  Basic Metabolic Panel: Recent Labs  Lab 01/15/19 0754  NA 140  K 3.3*  CL 104  CO2 25  GLUCOSE 152*  BUN 16  CREATININE 0.89  CALCIUM 8.7*    Liver Function Tests: Recent Labs  Lab 01/15/19 0754  AST 39  ALT 54*  ALKPHOS 71  BILITOT 0.7  PROT 7.3  ALBUMIN 3.9   No results for input(s): LIPASE, AMYLASE in the last 168 hours. No results for input(s): AMMONIA in the last 168 hours.  CBC: Recent Labs  Lab 01/15/19 0754  WBC 4.5  NEUTROABS 2.6  HGB 13.8  HCT 41.4  MCV 85.9  PLT 189    Cardiac Enzymes: No results for input(s): CKTOTAL, CKMB, CKMBINDEX, TROPONINI in the last 168 hours.  BNP: Invalid input(s): POCBNP  CBG: Recent Labs  Lab 01/15/19 0732  GLUCAP 142*    Microbiology: Results for orders placed or performed during the hospital encounter of 01/15/19  SARS Coronavirus 2 [facility] order, Performed in [facility] hospital lab) Nasopharyngeal Nasopharyngeal Swab     Status: None   Collection Time: 01/15/19  7:54 AM   Specimen:  Nasopharyngeal Swab  Result Value Ref Range Status   SARS Coronavirus 2 NEGATIVE NEGATIVE Final    Comment: (NOTE) If result is NEGATIVE SARS-CoV-2 target nucleic acids are NOT DETECTED. The SARS-CoV-2 RNA is generally detectable in upper and lower  respiratory specimens during the acute phase of infection. The lowest  concentration of SARS-CoV-2 viral copies this assay can detect is 250  copies / mL. A negative result does not preclude SARS-CoV-2 infection  and should not be used as the sole basis for treatment or other  patient management decisions.  A negative result may occur with  improper specimen collection / handling, submission of specimen other  than nasopharyngeal swab, presence of viral mutation(s) within the  areas targeted by this assay, and inadequate number of viral copies  (<250 copies / mL). A negative result must be combined with clinical  observations, patient history, and epidemiological information. If result is POSITIVE SARS-CoV-2 target nucleic acids are DETECTED. The SARS-CoV-2 RNA is generally detectable in upper and lower  respiratory specimens dur ing the acute phase of infection.  Positive  results are indicative of active infection with SARS-CoV-2.  Clinical  correlation with patient history and other diagnostic information is  necessary to determine patient infection status.  Positive results do  not rule out bacterial infection or co-infection with other viruses. If result is PRESUMPTIVE POSTIVE SARS-CoV-2 nucleic acids MAY BE PRESENT.   A presumptive positive result was obtained on the submitted specimen  and confirmed on repeat testing.  While 2019 novel coronavirus  (SARS-CoV-2) nucleic acids may be present in the submitted sample  additional confirmatory testing may be necessary for epidemiological  and / or clinical management purposes  to differentiate between  SARS-CoV-2 and other Sarbecovirus currently known to infect humans.  If clinically  indicated additional testing with an alternate test  methodology [phone]) is advised. The SARS-CoV-2 RNA is generally  detectable in upper and lower respiratory sp ecimens during the acute  phase of infection. The expected result is Negative. Fact Sheet for Patients:  StrictlyIdeas.no Fact Sheet for Healthcare Providers: BankingDealers.co.za This test is not yet approved or cleared by the [place] FDA and has been authorized for detection and/or diagnosis of SARS-CoV-2 by FDA under an Emergency Use Authorization (EUA).  This EUA will remain in effect (meaning this test can be used) for the duration of the COVID-19 declaration under Section 564(b)(1) of the Act, 21 U.S.C. section 360bbb-3(b)(1), unless the authorization is terminated or revoked sooner. Performed at [facility], [address], [place], [place] [address]     Coagulation Studies: Recent Labs    01/15/19 0754  LABPROT 12.8  INR  1.0    Urinalysis: No results for input(s): COLORURINE, LABSPEC, PHURINE, GLUCOSEU, HGBUR, BILIRUBINUR, KETONESUR, PROTEINUR, UROBILINOGEN, NITRITE, LEUKOCYTESUR in the last 168 hours.  Invalid input(s): APPERANCEUR  Lipid Panel:    Component Value Date/Time   CHOL 147 12/31/2018 0828   CHOL 92 03/03/2015 0900   TRIG 192 (H) 12/31/2018 0828   TRIG 96 03/03/2015 0900   HDL 39 (L) 12/31/2018 0828   VLDL 19 03/03/2015 0900   LDLCALC 70 12/31/2018 0828    HgbA1C:  Lab Results  Component Value Date   HGBA1C 5.9 12/31/2018    Urine Drug Screen:  No results found for: LABOPIA, COCAINSCRNUR, LABBENZ, AMPHETMU, THCU, LABBARB  Alcohol Level:  Recent Labs  Lab 01/15/19 [place] <10    Other results: EKG: sinus rhythm at 56 bpm.  Imaging: Ct Angio Head W Or Wo Contrast  Result Date: 01/15/2019 CLINICAL DATA:  Slurred speech with right arm numbness EXAM: CT ANGIOGRAPHY HEAD AND NECK TECHNIQUE: Multidetector CT  imaging of the head and neck was performed using the standard protocol during bolus administration of intravenous contrast. Multiplanar CT image reconstructions and MIPs were obtained to evaluate the vascular anatomy. Carotid stenosis measurements (when applicable) are obtained utilizing NASCET criteria, using the distal internal carotid diameter as the denominator. CONTRAST:  51mL OMNIPAQUE IOHEXOL 350 MG/ML SOLN COMPARISON:  Noncontrast head CT earlier today FINDINGS: CTA NECK FINDINGS Aortic arch: Mild calcified atherosclerosis. Three vessel branching. Right carotid system: Moderate mainly calcified plaque at the bifurcation without ulceration or flow limiting stenosis. Left carotid system: Moderate calcified plaque at the bifurcation without stenosis or ulceration. Vertebral arteries: No proximal subclavian stenosis. Both vertebral arteries are smooth and widely patent to the dura. 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 Stable mass below the left thyroid gland measuring 25 mm in maximum on axial slices. Upper chest: Negative Review of the MIP images confirms the above findings CTA HEAD FINDINGS Anterior circulation: Calcified plaque on the carotid siphons. No branch occlusion or flow limiting stenosis. Negative for aneurysm or beading. Posterior circulation: Vessels are smooth and diffusely patent. Negative for aneurysm Venous sinuses: Patent Anatomic variants: None significant Review of the MIP images confirms the above findings IMPRESSION: 1. No emergent finding. 2. Carotid atherosclerosis without flow limiting stenosis or ulceration. 3. 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 No detected growth when compared to 2019 neck CT. Electronically Signed   By: Monte Fantasia M.D.   On: 01/15/2019 08:52   Ct Angio Neck W And/or Wo Contrast  Result Date: 01/15/2019 CLINICAL DATA:  Slurred speech with right arm  numbness EXAM: CT ANGIOGRAPHY HEAD AND NECK TECHNIQUE: Multidetector CT imaging of the head and neck was performed using the standard protocol during bolus administration of intravenous contrast. Multiplanar CT image reconstructions and MIPs were obtained to evaluate the vascular anatomy. Carotid stenosis measurements (when applicable) are obtained utilizing NASCET criteria, using the distal internal carotid diameter as the denominator. CONTRAST:  22mL OMNIPAQUE IOHEXOL 350 MG/ML SOLN COMPARISON:  Noncontrast head CT earlier today FINDINGS: CTA NECK FINDINGS Aortic arch: Mild calcified atherosclerosis. Three vessel branching. Right carotid system: Moderate mainly calcified plaque at the bifurcation without ulceration or flow limiting stenosis. Left carotid system: Moderate calcified plaque at the bifurcation without stenosis or ulceration. Vertebral arteries: No proximal subclavian stenosis. Both vertebral arteries are smooth and widely patent to the dura. Skeleton: Negative Other neck: Known mass in the  right submandibular gland measuring 2 cm, stable from neck CT April 2019. Stable mass below the left thyroid gland measuring 25 mm in maximum on axial slices. Upper chest: Negative Review of the MIP images confirms the above findings CTA HEAD FINDINGS Anterior circulation: Calcified plaque on the carotid siphons. No branch occlusion or flow limiting stenosis. Negative for aneurysm or beading. Posterior circulation: Vessels are smooth and diffusely patent. Negative for aneurysm Venous sinuses: Patent Anatomic variants: None significant Review of the MIP images confirms the above findings IMPRESSION: 1. No emergent finding. 2. Carotid atherosclerosis without flow limiting stenosis or ulceration. 3. 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 No detected growth when compared to 2019 neck CT. Electronically Signed   By: Monte Fantasia M.D.   On: 01/15/2019 08:52   Ct Head Code Stroke Wo  Contrast  Result Date: 01/15/2019 CLINICAL DATA:  Code stroke.  Slurred speech and right arm numbness EXAM: CT HEAD WITHOUT CONTRAST TECHNIQUE: Contiguous axial images were obtained from the base of the skull through the vertex without intravenous contrast. COMPARISON:  None. FINDINGS: Brain: Chronic appearing (well-defined and low-density) lacunar infarcts in the left caudate head, bilateral thalamus, and right corpus callosum. Two small remote left cerebellar infarcts. Mild patchy low-density in the cerebral white matter. No evidence of acute gray matter infarct. No hemorrhage, hydrocephalus, or collection. Vascular: Atherosclerotic calcification.  No hyperdense vessel. Skull: Negative Sinuses/Orbits: Negative These results were called by telephone at the time of interpretation on 01/15/2019 at 7:45 am to Dr. Merlyn Lot , who verbally acknowledged these results. ASPECTS [facility] Stroke Program Early CT Score) - Ganglionic level infarction (caudate, lentiform nuclei, internal capsule, insula, M1-M3 cortex): 7 - Supraganglionic infarction (M4-M6 cortex): 3 Total score (0-10 with 10 being normal): 10 IMPRESSION: 1. No acute finding. 2. 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 Electronically Signed   By: Monte Fantasia M.D.   On: 01/15/2019 07:49    Assessment: 58 y.o. male presenting with difficulty with speech and right sided weakness that has now resolved.  Patient reports no previous similar events.  Head CT reviewed and shows no acute changes but there are some chronic lacunes noted.  CTA unremarkable.  Patient on no antiplatelet or anticoagulation therapy prior to admission.    Stroke Risk Factors - hyperlipidemia, hypertension and sleep apnea  Plan: 1. HgbA1c, fasting lipid panel 2. MRI  of the brain without contrast 3. PT consult, OT consult, Speech consult 4. Echocardiogram with bubble study 5. Prophylactic therapy-Dual antiplatelet therapy with ASA  81mg  and Plavix 75mg  for three weeks with change to ASA 81mg  daily alone as monotherapy after that time. 6. NPO until RN stroke swallow screen 7. Telemetry monitoring 8. Frequent neuro checks 9. EEG   Alexis Goodell, MD Neurology [phone] 01/15/2019, 11:06 AM

## 2019-01-15 NOTE — Progress Notes (Signed)
CODE STROKE- PHARMACY COMMUNICATION   Time CODE STROKE called/page received:[id number]  Time response to CODE STROKE was made (in person or via phone): [place]  Time Stroke Kit retrieved from [place] (only if needed): NA  Name of Provider/Nurse contacted:Valerie.   Past Medical History:  Diagnosis Date  . Hyperlipidemia   . Hypertension   . Obesity   . Sleep apnea    Prior to Admission medications   Medication Sig Start Date End Date Taking? Authorizing Provider  amLODipine (NORVASC) 10 MG tablet TAKE 1 TABLET BY MOUTH ONCE DAILY Patient taking differently: Take 10 mg by mouth daily.  10/20/18  Yes Cannady, Jolene T, NP  atorvastatin (LIPITOR) 10 MG tablet TAKE 1 TABLET BY MOUTH ONCE DAILY Patient taking differently: Take 10 mg by mouth daily at 6 PM.  10/20/18  Yes Cannady, Jolene T, NP  cyclobenzaprine (FLEXERIL) 10 MG tablet TAKE 1 TABLET BY MOUTH AT BEDTIME Patient taking differently: Take 10 mg by mouth at bedtime.  08/30/18  Yes Cannady, Jolene T, NP  Multiple Vitamins-Minerals (ONE-A-DAY MENS 50+ ADVANTAGE PO) Take 1 tablet by mouth daily.   Yes [provider]  telmisartan (MICARDIS) 80 MG tablet TAKE 1 TABLET BY MOUTH ONCE DAILY Patient taking differently: Take 80 mg by mouth daily.  12/27/18  Yes Cannady, Jolene T, NP  methocarbamol (ROBAXIN) 750 MG tablet Take 1 tablet (750 mg total) by mouth every 6 (six) hours as needed. Patient not taking: Reported on 01/15/2019 10/13/15   Kathrine Haddock, NP    Emanuel Campos L , RPh 01/15/2019  7:48 AM

## 2019-01-15 NOTE — ED Notes (Signed)
ED TO INPATIENT HANDOFF REPORT  ED Nurse Name and Phone #: Daivd Council Name/Age/Gender Paul Cannon 58 y.o. male Room/Bed: ED05A/ED05A  Code Status   Code Status: Full Code  Home/SNF/Other Home Patient oriented to: self, place, time and situation Is this baseline? Yes   Triage Complete: Triage complete  Chief Complaint slurred speech;rt sided weakness  Triage Note 0733-cleared for ct 0734-to ct per tech    Pt woke at 0500 by phone and felt normal at 0640 woke and felt funny on right side and had difficulty speaking.  Pt feels speech has improved but not normal.   Allergies Allergies  Allergen Reactions  . Percocet [Oxycodone-Acetaminophen] Diarrhea and Nausea And Vomiting  . Vicodin [Hydrocodone-Acetaminophen] Diarrhea and Nausea And Vomiting    Level of Care/Admitting Diagnosis ED Disposition    ED Disposition Condition [facility] Area: [place] [[id number]]  Level of Care: Med-Surg [16]  Covid Evaluation: Confirmed COVID Negative  Diagnosis: Stroke (cerebrum) [facility]) [[id number]]  Admitting Physician: Eula Flax  Attending Physician: Rufina Falco ACHIENG [phone]  Estimated length of stay: past midnight tomorrow  Certification:: I certify this patient will need inpatient services for at least 2 midnights  PT Class (Do Not Modify): Inpatient [101]  PT Acc Code (Do Not Modify): Private [1]       B Medical/Surgery History Past Medical History:  Diagnosis Date  . Hyperlipidemia   . Hypertension   . Obesity   . Sleep apnea    Past Surgical History:  Procedure Laterality Date  . COLONOSCOPY WITH PROPOFOL N/A 11/17/2015   Procedure: COLONOSCOPY WITH PROPOFOL;  Surgeon: [name], MD;  Location: [facility] ENDOSCOPY;  Service: Endoscopy;  Laterality: N/A;  . SHOULDER SURGERY       A IV Location/Drains/Wounds Patient Lines/Drains/Airways Status   Active Line/Drains/Airways    Name:    Placement date:   Placement time:   Site:   Days:   Peripheral IV 01/15/19 Right Hand   01/15/19    0755    Hand   less than 1   Peripheral IV 01/15/19 Right Forearm   01/15/19    0745    Forearm   less than 1   Peripheral IV 01/15/19 Right Antecubital   01/15/19    0810    Antecubital   less than 1          Intake/Output Last 24 hours No intake or output data in the 24 hours ending 01/15/19 3716  Labs/Imaging Results for orders placed or performed during the hospital encounter of 01/15/19 (from the past 48 hour(s))  Glucose, capillary     Status: Abnormal   Collection Time: 01/15/19  7:32 AM  Result Value Ref Range   Glucose-Capillary 142 (H) 70 - 99 mg/dL  Ethanol     Status: None   Collection Time: 01/15/19  7:54 AM  Result Value Ref Range   Alcohol, Ethyl (B) <10 <10 mg/dL    Comment: (NOTE) Lowest detectable limit for serum alcohol is 10 mg/dL. For medical purposes only. Performed at [facility], [address] [place], [place] [address]   Protime-INR     Status: None   Collection Time: 01/15/19  7:54 AM  Result Value Ref Range   Prothrombin Time 12.8 11.4 - 15.2 seconds   INR 1.0 0.8 - 1.2    Comment: (NOTE) INR goal varies based on device and disease states. Performed at [facility], [address] [place], [place] [address]   APTT     Status: None   Collection Time: 01/15/19  7:54 AM  Result Value Ref Range   aPTT 30 24 - 36 seconds    Comment: Performed at [facility], [address] [place], [place] [address]  CBC     Status: None   Collection Time: 01/15/19  7:54 AM  Result Value Ref Range   WBC 4.5 4.0 - 10.5 K/uL   RBC 4.82 4.22 - 5.81 MIL/uL   Hemoglobin 13.8 13.0 - 17.0 g/dL   HCT 41.4 39.0 - 52.0 %   MCV 85.9 80.0 - 100.0 fL   MCH 28.6 26.0 - 34.0 pg   MCHC 33.3 30.0 - 36.0 g/dL   RDW 13.9 11.5 - 15.5 %   Platelets 189 150 - 400 K/uL   nRBC 0.0 0.0 - 0.2 %    Comment: Performed at [facility], [address] [place], [place] [address]  Differential     Status: None   Collection Time: 01/15/19  7:54 AM  Result Value Ref Range   Neutrophils Relative % 59 %   Neutro Abs 2.6 1.7 - 7.7 K/uL   Lymphocytes Relative 29 %   Lymphs Abs 1.3 0.7 - 4.0 K/uL   Monocytes Relative 9 %   Monocytes Absolute 0.4 0.1 - 1.0 K/uL   Eosinophils Relative 3 %   Eosinophils Absolute 0.1 0.0 - 0.5 K/uL   Basophils Relative 0 %   Basophils Absolute 0.0 0.0 - 0.1 K/uL   Immature Granulocytes 0 %   Abs Immature Granulocytes 0.01 0.00 - 0.07 K/uL    Comment: Performed at [facility], [address] [place], [place] [address]  Comprehensive metabolic panel     Status: Abnormal   Collection Time: 01/15/19  7:54 AM  Result Value Ref Range   Sodium 140 135 - 145 mmol/L   Potassium 3.3 (L) 3.5 - 5.1 mmol/L   Chloride 104 98 - 111 mmol/L   CO2 25 22 - 32 mmol/L   Glucose, Bld 152 (H) 70 - 99 mg/dL   BUN 16 6 - 20 mg/dL   Creatinine, Ser 0.89 0.61 - 1.24 mg/dL   Calcium 8.7 (L) 8.9 - 10.3 mg/dL   Total Protein 7.3 6.5 - 8.1 g/dL   Albumin 3.9 3.5 - 5.0 g/dL   AST 39 15 - 41 U/L   ALT 54 (H) 0 - 44 U/L   Alkaline Phosphatase 71 38 - 126 U/L   Total Bilirubin 0.7 0.3 - 1.2 mg/dL   GFR calc non Af Amer >60 >60 mL/min   GFR calc Af Amer >60 >60 mL/min   Anion gap 11 5 - 15    Comment: Performed at [facility], [address], [place], [place] [address]  SARS Coronavirus 2 [facility] order, Performed in [facility] hospital lab) Nasopharyngeal Nasopharyngeal Swab     Status: None   Collection Time: 01/15/19  7:54 AM   Specimen: Nasopharyngeal Swab  Result Value Ref Range   SARS Coronavirus 2 NEGATIVE NEGATIVE    Comment: (NOTE) If result is NEGATIVE SARS-CoV-2 target nucleic acids are NOT DETECTED. The SARS-CoV-2 RNA is generally detectable in upper and lower  respiratory specimens during the acute phase of infection. The lowest  concentration of SARS-CoV-2 viral copies this assay  can detect is 250  copies / mL. A negative result does not preclude SARS-CoV-2 infection  and should not be used as the  sole basis for treatment or other  patient management decisions.  A negative result may occur with  improper specimen collection / handling, submission of specimen other  than nasopharyngeal swab, presence of viral mutation(s) within the  areas targeted by this assay, and inadequate number of viral copies  (<250 copies / mL). A negative result must be combined with clinical  observations, patient history, and epidemiological information. If result is POSITIVE SARS-CoV-2 target nucleic acids are DETECTED. The SARS-CoV-2 RNA is generally detectable in upper and lower  respiratory specimens dur ing the acute phase of infection.  Positive  results are indicative of active infection with SARS-CoV-2.  Clinical  correlation with patient history and other diagnostic information is  necessary to determine patient infection status.  Positive results do  not rule out bacterial infection or co-infection with other viruses. If result is PRESUMPTIVE POSTIVE SARS-CoV-2 nucleic acids MAY BE PRESENT.   A presumptive positive result was obtained on the submitted specimen  and confirmed on repeat testing.  While 2019 novel coronavirus  (SARS-CoV-2) nucleic acids may be present in the submitted sample  additional confirmatory testing may be necessary for epidemiological  and / or clinical management purposes  to differentiate between  SARS-CoV-2 and other Sarbecovirus currently known to infect humans.  If clinically indicated additional testing with an alternate test  methodology [phone]) is advised. The SARS-CoV-2 RNA is generally  detectable in upper and lower respiratory sp ecimens during the acute  phase of infection. The expected result is Negative. Fact Sheet for Patients:  StrictlyIdeas.no Fact Sheet for Healthcare  Providers: BankingDealers.co.za This test is not yet approved or cleared by the [place] FDA and has been authorized for detection and/or diagnosis of SARS-CoV-2 by FDA under an Emergency Use Authorization (EUA).  This EUA will remain in effect (meaning this test can be used) for the duration of the COVID-19 declaration under Section 564(b)(1) of the Act, 21 U.S.C. section 360bbb-3(b)(1), unless the authorization is terminated or revoked sooner. Performed at [facility], [place], [place] [address]    Ct Angio Head W Or Wo Contrast  Result Date: 01/15/2019 CLINICAL DATA:  Slurred speech with right arm numbness EXAM: CT ANGIOGRAPHY HEAD AND NECK TECHNIQUE: Multidetector CT imaging of the head and neck was performed using the standard protocol during bolus administration of intravenous contrast. Multiplanar CT image reconstructions and MIPs were obtained to evaluate the vascular anatomy. Carotid stenosis measurements (when applicable) are obtained utilizing NASCET criteria, using the distal internal carotid diameter as the denominator. CONTRAST:  62mL OMNIPAQUE IOHEXOL 350 MG/ML SOLN COMPARISON:  Noncontrast head CT earlier today FINDINGS: CTA NECK FINDINGS Aortic arch: Mild calcified atherosclerosis. Three vessel branching. Right carotid system: Moderate mainly calcified plaque at the bifurcation without ulceration or flow limiting stenosis. Left carotid system: Moderate calcified plaque at the bifurcation without stenosis or ulceration. Vertebral arteries: No proximal subclavian stenosis. Both vertebral arteries are smooth and widely patent to the dura. 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 Stable mass below the left thyroid gland measuring 25 mm in maximum on axial slices. Upper chest: Negative Review of the MIP images confirms the above findings CTA HEAD FINDINGS  Anterior circulation: Calcified plaque on the carotid siphons. No branch occlusion or flow limiting stenosis. Negative for aneurysm or beading. Posterior circulation: Vessels are smooth and diffusely patent. Negative for aneurysm Venous sinuses: Patent Anatomic variants: None significant Review of  the MIP images confirms the above findings IMPRESSION: 1. No emergent finding. 2. Carotid atherosclerosis without flow limiting stenosis or ulceration. 3. 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 No detected growth when compared to 2019 neck CT. Electronically Signed   By: Monte Fantasia M.D.   On: 01/15/2019 08:52   Ct Angio Neck W And/or Wo Contrast  Result Date: 01/15/2019 CLINICAL DATA:  Slurred speech with right arm numbness EXAM: CT ANGIOGRAPHY HEAD AND NECK TECHNIQUE: Multidetector CT imaging of the head and neck was performed using the standard protocol during bolus administration of intravenous contrast. Multiplanar CT image reconstructions and MIPs were obtained to evaluate the vascular anatomy. Carotid stenosis measurements (when applicable) are obtained utilizing NASCET criteria, using the distal internal carotid diameter as the denominator. CONTRAST:  62mL OMNIPAQUE IOHEXOL 350 MG/ML SOLN COMPARISON:  Noncontrast head CT earlier today FINDINGS: CTA NECK FINDINGS Aortic arch: Mild calcified atherosclerosis. Three vessel branching. Right carotid system: Moderate mainly calcified plaque at the bifurcation without ulceration or flow limiting stenosis. Left carotid system: Moderate calcified plaque at the bifurcation without stenosis or ulceration. Vertebral arteries: No proximal subclavian stenosis. Both vertebral arteries are smooth and widely patent to the dura. 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 Stable mass below the left thyroid gland measuring 25 mm in maximum on axial slices. Upper chest:  Negative Review of the MIP images confirms the above findings CTA HEAD FINDINGS Anterior circulation: Calcified plaque on the carotid siphons. No branch occlusion or flow limiting stenosis. Negative for aneurysm or beading. Posterior circulation: Vessels are smooth and diffusely patent. Negative for aneurysm Venous sinuses: Patent Anatomic variants: None significant Review of the MIP images confirms the above findings IMPRESSION: 1. No emergent finding. 2. Carotid atherosclerosis without flow limiting stenosis or ulceration. 3. 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 No detected growth when compared to 2019 neck CT. Electronically Signed   By: Monte Fantasia M.D.   On: 01/15/2019 08:52   Ct Head Code Stroke Wo Contrast  Result Date: 01/15/2019 CLINICAL DATA:  Code stroke.  Slurred speech and right arm numbness EXAM: CT HEAD WITHOUT CONTRAST TECHNIQUE: Contiguous axial images were obtained from the base of the skull through the vertex without intravenous contrast. COMPARISON:  None. FINDINGS: Brain: Chronic appearing (well-defined and low-density) lacunar infarcts in the left caudate head, bilateral thalamus, and right corpus callosum. Two small remote left cerebellar infarcts. Mild patchy low-density in the cerebral white matter. No evidence of acute gray matter infarct. No hemorrhage, hydrocephalus, or collection. Vascular: Atherosclerotic calcification.  No hyperdense vessel. Skull: Negative Sinuses/Orbits: Negative These results were called by telephone at the time of interpretation on 01/15/2019 at 7:45 am to Dr. Merlyn Lot , who verbally acknowledged these results. ASPECTS [facility] Stroke Program Early CT Score) - Ganglionic level infarction (caudate, lentiform nuclei, internal capsule, insula, M1-M3 cortex): 7 - Supraganglionic infarction (M4-M6 cortex): 3 Total score (0-10 with 10 being normal): 10 IMPRESSION: 1. No acute finding. 2. 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 Electronically Signed   By: Monte Fantasia M.D.   On: 01/15/2019 07:49    Pending Labs Unresulted Labs (From admission, onward)    Start     Ordered   01/15/19 0911  Hemoglobin A1c  Add-on,   AD     01/15/19 0912   01/15/19 0911  Lipid panel  Add-on,   AD  Comments: Fasting    01/15/19 0912   01/15/19 0910  HIV antibody (Routine Testing)  Once,   STAT     01/15/19 0912   01/15/19 0734  Urine Drug Screen, Qualitative  Once,   STAT     01/15/19 0733   01/15/19 0734  Urinalysis, Routine w reflex microscopic  ONCE - STAT,   STAT     01/15/19 0733          Vitals/Pain Today's Vitals   01/15/19 0845 01/15/19 0900 01/15/19 0915 01/15/19 0923  BP: (!) 151/71 (!) 141/74 140/67   Pulse: (!) 49 (!) 54 (!) 51   Resp: 13 15 14    Temp:    97.9 F (36.6 C)  TempSrc:    Oral  SpO2: 97% 99% 98%   Weight:      Height:      PainSc:        Isolation Precautions No active isolations  Medications Medications  alteplase (ACTIVASE) 1 mg/mL injection (has no administration in time range)  amLODipine (NORVASC) tablet 10 mg (has no administration in time range)  atorvastatin (LIPITOR) tablet 10 mg (has no administration in time range)  irbesartan (AVAPRO) tablet 300 mg (has no administration in time range)  cyclobenzaprine (FLEXERIL) tablet 10 mg (has no administration in time range)   stroke: mapping our early stages of recovery book (has no administration in time range)  0.9 %  sodium chloride infusion (has no administration in time range)  senna-docusate (Senokot-S) tablet 1 tablet (has no administration in time range)  enoxaparin (LOVENOX) injection 40 mg (has no administration in time range)  potassium chloride SA (K-DUR) CR tablet 20 mEq (has no administration in time range)  aspirin chewable tablet 324 mg (324 mg Oral Given 01/15/19 0819)  sodium chloride 0.9 % bolus 500 mL (500 mLs Intravenous New Bag/Given 01/15/19 0843)  iohexol (OMNIPAQUE)  350 MG/ML injection 75 mL (75 mLs Intravenous Contrast Given 01/15/19 0826)    Mobility walks Low fall risk   Focused Assessments Neuro Assessment Handoff:  Swallow screen pass? Yes  Cardiac Rhythm: Sinus bradycardia NIH Stroke Scale ( + Modified Stroke Scale Criteria)  Interval: Initial Level of Consciousness (1a.)   : Alert, keenly responsive LOC Questions (1b. )   +: Answers both questions correctly LOC Commands (1c. )   + : Performs both tasks correctly Best Gaze (2. )  +: Normal Visual (3. )  +: No visual loss Facial Palsy (4. )    : Minor paralysis Motor Arm, Left (5a. )   +: No drift Motor Arm, Right (5b. )   +: No drift Motor Leg, Left (6a. )   +: No drift Motor Leg, Right (6b. )   +: No drift Limb Ataxia (7. ): Absent Sensory (8. )   +: Normal, no sensory loss Best Language (9. )   +: No aphasia Dysarthria (10. ): Normal Extinction/Inattention (11.)   +: No Abnormality Modified SS Total  +: 0 Complete NIHSS TOTAL: 1 Last date known well: 01/15/19 Last time known well: 0500 Neuro Assessment: Exceptions to [facility] Neuro Checks:   Initial (01/15/19 0754)  Last Documented NIHSS Modified Score: 0 (01/15/19 0913) Has TPA been given? No If patient is a Neuro Trauma and patient is going to OR before floor call report to [place] nurse: [phone] or [phone]     R Recommendations: See Admitting Provider Note  Report given to:   Additional Notes:

## 2019-01-15 NOTE — Procedures (Signed)
ELECTROENCEPHALOGRAM REPORT   Patient: Paul Cannon       Room #: 120A-AA EEG No. ID: [id number] Age: 58 y.o.        Sex: male Referring Physician: Stark Klein Report Date:  01/15/2019        Interpreting Physician: Alexis Goodell  History: [name] is an 58 y.o. male with episode of speech difficulty and right sided weakness  Medications:  Norvasc, Plavix, Lipitor, Flexeril, Avapro  Conditions of Recording:  This is a 21 channel routine scalp EEG performed with bipolar and monopolar montages arranged in accordance to the international 10/20 system of electrode placement. One channel was dedicated to EKG recording.  The patient is in the awake and drowsy states.  Description:  The waking background activity consists of a low voltage, symmetrical, fairly well organized, 10 Hz alpha activity, seen from the parieto-occipital and posterior temporal regions.  Low voltage fast activity, poorly organized, is seen anteriorly and is at times superimposed on more posterior regions.  A mixture of theta and alpha rhythms are seen from the central and temporal regions. The patient drowses with slowing to irregular, low voltage theta and beta activity.   Stage II sleep is not obtained. No epileptiform activity is noted.   Hyperventilation was not performed.  Intermittent photic stimulation was performed but failed to illicit any change in the tracing.    IMPRESSION: Normal electroencephalogram, awake, drowsy and with activation procedures. There are no focal lateralizing or epileptiform features.   Alexis Goodell, MD Neurology [phone] 01/15/2019, 3:27 PM

## 2019-01-15 NOTE — Progress Notes (Signed)
*  PRELIMINARY RESULTS* Echocardiogram 2D Echocardiogram has been performed.  Paul Cannon 01/15/2019, 3:42 PM

## 2019-01-15 NOTE — ED Notes (Signed)
Tele RN on screen

## 2019-01-15 NOTE — ED Triage Notes (Signed)
0733-cleared for ct 0734-to ct per tech

## 2019-01-15 NOTE — ED Notes (Signed)
Pt flat at this time to increase CPP until cleared for code stroke.

## 2019-01-15 NOTE — ED Notes (Signed)
Patient transported to CT with RN 

## 2019-01-15 NOTE — ED Notes (Signed)
First RN Note: Pt presents to ED via POV with his wife. Per pt with R sided facial droop noted upon arrival to ED and slurred speech. Equal grip and leg strength noted at this time. History provided by patient and wife. Pt's wife reports alarm company called at 0500 this morning and woke patient up, pt and wife report patient was "normal" and at baseline. Pt reports going back to sleep and waking up at 0630 "not feeling right" and having difficulty speaking. CBG checked by this RN, CT notified of code stroke, pt transported to [place] by Olivia Mackie, EDT.

## 2019-01-15 NOTE — Progress Notes (Signed)
   01/15/19 0700  Clinical Encounter Type  Visited With Family  Visit Type Initial  Referral From Nurse  Consult/Referral To Chaplain  Spiritual Encounters  Spiritual Needs Emotional  Chaplain received page for code stroke. Chaplain arrived at and wife was in room and patient was being pushed in wheel chair into room. Chaplain offered support to patient's wife and told her to have Chaplain page if needed.

## 2019-01-15 NOTE — Progress Notes (Signed)
eeg completed ° °

## 2019-01-15 NOTE — ED Notes (Addendum)
MD with tele med on screen

## 2019-01-15 NOTE — H&P (Addendum)
[facility] [place] NAME: Paul Cannon    MR#:  [id number]  DATE OF BIRTH:  03-May-1961  DATE OF ADMISSION:  01/15/2019  PRIMARY CARE PHYSICIAN: Venita Lick, NP   REQUESTING/REFERRING PHYSICIAN: Merlyn Lot, MD  CHIEF COMPLAINT:   Chief Complaint  Patient presents with   Code Stroke    HISTORY OF PRESENT ILLNESS:  58 year old male with past medical history of hypertension, hyperlipidemia, morbid obesity, and sleep apnea on CPAP presenting to the ED with right facial droop and slurred speech.  Patient and patient's wife report that alarm company called at around 0500 this morning and woke him up, per wife patient was " normal" and at baseline.  He went back to bed and woke up at around 6:30am " not feeling right".  Patient states he went to the bathroom and noticed that his right hand was shaking and difficulty with coordination.  He tried to use the toothbrush but noticed that he was having some difficulty.  Patient states he proceeded to take a bath and while showering he was making " unusual gibberish sound".  Patient states when he got out of the shower he felt a warm rash all over his body associated with nausea without vomiting.  He went to tell his wife that he was leaving but  he noticed that he was having difficulty getting his words out.  Patient's wife also noticed that he was slurring his speech with right facial droop they therefore decided to come to the ED for further evaluation.  On arrival to the ED, he was afebrile with blood pressure 150/74 mm Hg and pulse rate 53 beats/min. He was noted to have right facial droop otherwise no other focal neurological deficit; he was alert and oriented x4, and he did not demonstrate any memory deficits.  Initial NIH stroke scale 1.  Code stroke was initiated and patient evaluated by a tele-neurologist.  Noncontrast CT head was obtained and showed no acute intracranial  abnormality.  Follow-up CTA head and neck showed no large vessel occlusion.  Per tele-neurologist clinical presentation was not suggestive of large vessel occlusive disease therefore patient was not a candidate for thrombectomy.  He was also not deemed candidate for tPA thrombolytics because of resolved symptoms with no residual disabling symptoms. Patient will be admitted under hospitalist service for further stroke work up.   Past Medical History:  Diagnosis Date   Hyperlipidemia    Hypertension    Obesity    Sleep apnea     PAST SURGICAL HISTORY:   Past Surgical History:  Procedure Laterality Date   COLONOSCOPY WITH PROPOFOL N/A 11/17/2015   Procedure: COLONOSCOPY WITH PROPOFOL;  Surgeon: [name], MD;  Location: [facility] ENDOSCOPY;  Service: Endoscopy;  Laterality: N/A;   SHOULDER SURGERY      SOCIAL HISTORY:   Social History   Tobacco Use   Smoking status: Former Smoker    Quit date: 09/30/2009    Years since quitting: 9.2   Smokeless tobacco: Former Systems developer  Substance Use Topics   Alcohol use: Yes    Comment: pt states occasional or very rarely    FAMILY HISTORY:   Family History  Problem Relation Age of Onset   Arthritis Mother    Hyperlipidemia Mother    Hypertension Mother    Migraines Mother    Heart disease Father        MI   Stroke Father    Lung disease Father  Cancer Sister        breast   Cancer Brother        melanoma   Stroke Sister     DRUG ALLERGIES:   Allergies  Allergen Reactions   Percocet [Oxycodone-Acetaminophen] Diarrhea and Nausea And Vomiting   Vicodin [Hydrocodone-Acetaminophen] Diarrhea and Nausea And Vomiting    REVIEW OF SYSTEMS:   Review of Systems  Constitutional: Negative for chills, fever, malaise/fatigue and weight loss.  HENT: Negative for congestion, hearing loss and sore throat.   Eyes: Negative for blurred vision and double vision.  Respiratory: Negative for cough, shortness of breath and  wheezing.   Cardiovascular: Negative for chest pain, palpitations, orthopnea and leg swelling.  Gastrointestinal: Negative for abdominal pain, diarrhea, nausea and vomiting.  Genitourinary: Negative for dysuria and urgency.  Musculoskeletal: Negative for myalgias.  Skin: Negative for rash.  Neurological: Positive for sensory change, speech change and weakness. Negative for dizziness, focal weakness and headaches.       Numb sensation at the back of throat  Psychiatric/Behavioral: Negative for depression.    MEDICATIONS AT HOME:   Prior to Admission medications   Medication Sig Start Date End Date Taking? Authorizing Provider  amLODipine (NORVASC) 10 MG tablet TAKE 1 TABLET BY MOUTH ONCE DAILY Patient taking differently: Take 10 mg by mouth daily.  10/20/18  Yes Cannady, Jolene T, NP  atorvastatin (LIPITOR) 10 MG tablet TAKE 1 TABLET BY MOUTH ONCE DAILY Patient taking differently: Take 10 mg by mouth daily at 6 PM.  10/20/18  Yes Cannady, Jolene T, NP  cyclobenzaprine (FLEXERIL) 10 MG tablet TAKE 1 TABLET BY MOUTH AT BEDTIME Patient taking differently: Take 10 mg by mouth at bedtime.  08/30/18  Yes Cannady, Jolene T, NP  Multiple Vitamins-Minerals (ONE-A-DAY MENS 50+ ADVANTAGE PO) Take 1 tablet by mouth daily.   Yes [provider]  telmisartan (MICARDIS) 80 MG tablet TAKE 1 TABLET BY MOUTH ONCE DAILY Patient taking differently: Take 80 mg by mouth daily.  12/27/18  Yes Cannady, Jolene T, NP  methocarbamol (ROBAXIN) 750 MG tablet Take 1 tablet (750 mg total) by mouth every 6 (six) hours as needed. Patient not taking: Reported on 01/15/2019 10/13/15   Kathrine Haddock, NP      VITAL SIGNS:  Blood pressure (!) 151/71, pulse (!) 49, resp. rate 13, height 6' (1.829 m), weight (!) 146.7 kg, SpO2 97 %.  PHYSICAL EXAMINATION:   Physical Exam  GENERAL:  58 y.o.-year-old patient lying in the bed with no acute distress.  EYES: Pupils equal, round, reactive to light and accommodation. No  scleral icterus. Extraocular muscles intact.  HEENT: Head atraumatic, normocephalic. Oropharynx and nasopharynx clear.  NECK:  Supple, no jugular venous distention. No thyroid enlargement, no tenderness.  LUNGS: Normal breath sounds bilaterally, no wheezing, rales,rhonchi or crepitation. No use of accessory muscles of respiration.  CARDIOVASCULAR: S1, S2 normal. No murmurs, rubs, or gallops.  ABDOMEN: Soft, nontender, nondistended. Bowel sounds present. No organomegaly or mass.  EXTREMITIES: No pedal edema, cyanosis, or clubbing. No rash or lesions. + pedal pulses MUSCULOSKELETAL: Normal bulk, and power was 5+ grip and elbow, knee, and ankle flexion and extension bilaterally.  NEUROLOGIC:Alert and oriented x 3. CN 2-12 intact except mild right facial droop.  Sensation to light touch and cold stimuli intact bilaterally. Finger to nose nl. Babinski is downgoing. DTR's (biceps, patellar, and achilles) 2+ and symmetric throughout. Gait not tested due to safety concern. PSYCHIATRIC: The patient is alert and oriented x 3.  SKIN:  No obvious rash, lesion, or ulcer.   DATA REVIEWED:  LABORATORY PANEL:   CBC Recent Labs  Lab 01/15/19 0754  WBC 4.5  HGB 13.8  HCT 41.4  PLT 189   ------------------------------------------------------------------------------------------------------------------  Chemistries  Recent Labs  Lab 01/15/19 0754  NA 140  K 3.3*  CL 104  CO2 25  GLUCOSE 152*  BUN 16  CREATININE 0.89  CALCIUM 8.7*  AST 39  ALT 54*  ALKPHOS 71  BILITOT 0.7   ------------------------------------------------------------------------------------------------------------------  Cardiac Enzymes No results for input(s): TROPONINI in the last 168 hours. ------------------------------------------------------------------------------------------------------------------  RADIOLOGY:  Ct Angio Head W Or Wo Contrast  Result Date: 01/15/2019 CLINICAL DATA:  Slurred speech with right arm  numbness EXAM: CT ANGIOGRAPHY HEAD AND NECK TECHNIQUE: Multidetector CT imaging of the head and neck was performed using the standard protocol during bolus administration of intravenous contrast. Multiplanar CT image reconstructions and MIPs were obtained to evaluate the vascular anatomy. Carotid stenosis measurements (when applicable) are obtained utilizing NASCET criteria, using the distal internal carotid diameter as the denominator. CONTRAST:  32mL OMNIPAQUE IOHEXOL 350 MG/ML SOLN COMPARISON:  Noncontrast head CT earlier today FINDINGS: CTA NECK FINDINGS Aortic arch: Mild calcified atherosclerosis. Three vessel branching. Right carotid system: Moderate mainly calcified plaque at the bifurcation without ulceration or flow limiting stenosis. Left carotid system: Moderate calcified plaque at the bifurcation without stenosis or ulceration. Vertebral arteries: No proximal subclavian stenosis. Both vertebral arteries are smooth and widely patent to the dura. 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 Stable mass below the left thyroid gland measuring 25 mm in maximum on axial slices. Upper chest: Negative Review of the MIP images confirms the above findings CTA HEAD FINDINGS Anterior circulation: Calcified plaque on the carotid siphons. No branch occlusion or flow limiting stenosis. Negative for aneurysm or beading. Posterior circulation: Vessels are smooth and diffusely patent. Negative for aneurysm Venous sinuses: Patent Anatomic variants: None significant Review of the MIP images confirms the above findings IMPRESSION: 1. No emergent finding. 2. Carotid atherosclerosis without flow limiting stenosis or ulceration. 3. 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 No detected growth when compared to 2019 neck CT. Electronically Signed   By: Monte Fantasia M.D.   On: 01/15/2019 08:52   Ct Angio Neck W And/or Wo  Contrast  Result Date: 01/15/2019 CLINICAL DATA:  Slurred speech with right arm numbness EXAM: CT ANGIOGRAPHY HEAD AND NECK TECHNIQUE: Multidetector CT imaging of the head and neck was performed using the standard protocol during bolus administration of intravenous contrast. Multiplanar CT image reconstructions and MIPs were obtained to evaluate the vascular anatomy. Carotid stenosis measurements (when applicable) are obtained utilizing NASCET criteria, using the distal internal carotid diameter as the denominator. CONTRAST:  1mL OMNIPAQUE IOHEXOL 350 MG/ML SOLN COMPARISON:  Noncontrast head CT earlier today FINDINGS: CTA NECK FINDINGS Aortic arch: Mild calcified atherosclerosis. Three vessel branching. Right carotid system: Moderate mainly calcified plaque at the bifurcation without ulceration or flow limiting stenosis. Left carotid system: Moderate calcified plaque at the bifurcation without stenosis or ulceration. Vertebral arteries: No proximal subclavian stenosis. Both vertebral arteries are smooth and widely patent to the dura. 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 Stable mass below the left thyroid gland measuring 25 mm in maximum on axial slices. Upper chest: Negative Review of the MIP images confirms the above findings  CTA HEAD FINDINGS Anterior circulation: Calcified plaque on the carotid siphons. No branch occlusion or flow limiting stenosis. Negative for aneurysm or beading. Posterior circulation: Vessels are smooth and diffusely patent. Negative for aneurysm Venous sinuses: Patent Anatomic variants: None significant Review of the MIP images confirms the above findings IMPRESSION: 1. No emergent finding. 2. Carotid atherosclerosis without flow limiting stenosis or ulceration. 3. 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 No detected growth when compared to 2019 neck CT. Electronically Signed   By:  Monte Fantasia M.D.   On: 01/15/2019 08:52   Ct Head Code Stroke Wo Contrast  Result Date: 01/15/2019 CLINICAL DATA:  Code stroke.  Slurred speech and right arm numbness EXAM: CT HEAD WITHOUT CONTRAST TECHNIQUE: Contiguous axial images were obtained from the base of the skull through the vertex without intravenous contrast. COMPARISON:  None. FINDINGS: Brain: Chronic appearing (well-defined and low-density) lacunar infarcts in the left caudate head, bilateral thalamus, and right corpus callosum. Two small remote left cerebellar infarcts. Mild patchy low-density in the cerebral white matter. No evidence of acute gray matter infarct. No hemorrhage, hydrocephalus, or collection. Vascular: Atherosclerotic calcification.  No hyperdense vessel. Skull: Negative Sinuses/Orbits: Negative These results were called by telephone at the time of interpretation on 01/15/2019 at 7:45 am to Dr. Merlyn Lot , who verbally acknowledged these results. ASPECTS [facility] Stroke Program Early CT Score) - Ganglionic level infarction (caudate, lentiform nuclei, internal capsule, insula, M1-M3 cortex): 7 - Supraganglionic infarction (M4-M6 cortex): 3 Total score (0-10 with 10 being normal): 10 IMPRESSION: 1. No acute finding. 2. 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 Electronically Signed   By: Monte Fantasia M.D.   On: 01/15/2019 07:49    EKG:  EKG: normal EKG, normal sinus rhythm, unchanged from previous tracings. Vent. rate 56 BPM PR interval * ms QRS duration 109 ms QT/QTc 475/459 ms P-R-T axes 18 -21 51 IMPRESSION AND PLAN:   58 y.o. male with past medical history of hypertension, hyperlipidemia, morbid obesity, and sleep apnea on CPAP presenting to the ED with right facial droop and slurred speech.  1.  Slurred speech and right facial droop - Concerns for ischemic event/TIA. Patient report he was on any antiplatelet or anti-coagulation prior to this event - Admit to  medsurg unit - CT head reviewed and shows no acute intracranial abnormality - CTA head and neck with no LVO - We will obtain MRI brain without contrast - Check HgbA1c, fasting lipid panel - PT consult, OT consult, Speech consult - Echocardiogram - Prophylactic therapy-Dual antiplatelet therapy with ASA 81mg  and Plavix 75mg  for three weeks with change to ASA 81mg  daily alone as monotherapy after that time. - NPO until RN stroke swallow screen -Telemetry monitoring - Frequent neuro checks - EEG per Neurology - Neurology input appreciated  2. HTN  + Long term goal Goal BP <140/90 -Continue amlodipine + Irbesartan  3. HLD  + Goal LDL<70 - Check Lipid panel as above - Atorvastatin 10mg  PO qhs  4. Hypokalemia - Replete - Recheck in the am  5. OSA - on CPAP  6. DVT prophylaxis - Enoxaparin  SubQ    All the records are reviewed and case discussed with ED provider. Management plans discussed with the patient, family and they are in agreement.  CODE STATUS: FULL  TOTAL TIME TAKING CARE OF THIS PATIENT: 50 minutes.    on 01/15/2019 at 9:13 AM   Rufina Falco, DNP, FNP-BC [facility] Nurse Practitioner Between 7am  to 6pm - Pager - [phone]  After 6pm go to [email or web address] - password [email or web address] [facility]  Office  [phone]  CC: Primary care physician; Venita Lick, NP

## 2019-01-15 NOTE — Consult Note (Addendum)
[facility] Tele[facility]urology Consult Services   Date of Service:   01/15/2019 07:50:34  Impression:     .  Rule Out Acute Ischemic Stroke     .  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 Based on this, discussing indications, risks and benefits of tPA vs aspirin therapy, we recommend aspirin only as he is aspirin naive and his symptoms are mild and improving.   Full dose ASA x1 now, then 81 mg daily thereafter for secondary prevention In light of the iniitial presentation with aphasia and subjective complaints, would recommend advanced imaging to rule out a distal MCA occlusion or high grade stenosis that could make him at higher risk of recurrent symptoms.   Please call back if he does become symptomatic in the interim as he is still a tPA candidate.  Will follow up advanced imaging and advise -- NO LVO on CTA  Mechanism of Stroke: Possible Cardioembolic Small Vessel Disease  Metrics: Last Known Well: 01/15/2019 05:00:00 [facility] Notification Time: 01/15/2019 07:50:34 Arrival Time: 01/15/2019 07:26:00 Stamp Time: 01/15/2019 07:50:34 Time First Login Attempt: 01/15/2019 07:55:08 Video Start Time: 01/15/2019 07:57:01  Symptoms: transient aphasia, right sided weakness NIHSS Start Assessment Time: 01/15/2019 07:59:41 Patient is not a candidate for tPA. Patient was not deemed candidate for tPA thrombolytics because of Resolved symptoms (no residual disabling symptoms). Video End Time: 01/15/2019 08:13:32  CT head showed no acute hemorrhage or acute core infarct.  Lower Likelihood of Large Vessel Occlusion but Following Stat Studies are Recommended  CTA Head and Neck.   ED Physician notified of diagnostic impression and management plan on 01/15/2019 08:13:33  Our recommendations are outlined below.  Recommendations:  .  Activate Stroke Protocol Admission/Order Set     .  Stroke/Telemetry Floor     .  Neuro Checks     .  Bedside Swallow Eval     .  DVT Prophylaxis     .  IV Fluids, Normal Saline     .  Head of Bed 30 Degrees     .  Euglycemia and Avoid Hyperthermia (PRN Acetaminophen)     .  Antiplatelet Therapy Recommended     .  Initiate Aspirin 81 MG Daily  Routine Consultation with [facility] for Follow up Care  Sign Out:     .  Discussed with Emergency Department Provider    ------------------------------------------------------------------------------  History of Present Illness: Patient is a 58 year old Male.  Patient was brought by private transportation with symptoms of transient aphasia, right sided weakness  hx HTN, HLD, OSA on CPAP, who woke up at 5a and felt normal at that time. At [place] he felt funny with right sided deficits and difficulty speaking. He had word finding difficulties that has resolved. He still has mild right facial asymmetry only when smiling. Wife says his speech still sound a bit slurred and he does trip up on a few words when speaking  CT head is negative with ASPECTS of 10, chronic small vessel disease  Last seen normal was within 4.5 hours. There is no history of hemorrhagic complications or intracranial hemorrhage. There is no history of Recent Anticoagulants. There is no history of recent major surgery. There is no history of recent stroke.  Examination: BP(156/71), Blood Glucose(142) 1A: Level of Consciousness - Alert; keenly responsive + 0 1B: Ask Month and Age -  Both Questions Right + 0 1C: Blink Eyes & Squeeze Hands - Performs Both Tasks + 0 2: Test Horizontal Extraocular Movements - Normal + 0 3: Test Visual Fields - No Visual Loss + 0 4: Test Facial Palsy (Use Grimace if Obtunded) - Minor paralysis (flat nasolabial fold, smile asymmetry) + 1 5A: Test Left Arm Motor Drift - No Drift for 10 Seconds + 0 5B: Test Right Arm Motor Drift - No  Drift for 10 Seconds + 0 6A: Test Left Leg Motor Drift - No Drift for 5 Seconds + 0 6B: Test Right Leg Motor Drift - No Drift for 5 Seconds + 0 7: Test Limb Ataxia (FNF/Heel-Shin) - No Ataxia + 0 8: Test Sensation - Normal; No sensory loss + 0 9: Test Language/Aphasia - Normal; No aphasia + 0 10: Test Dysarthria - Normal + 0 11: Test Extinction/Inattention - No abnormality + 0  NIHSS Score: 1  Patient/Family was informed the Neurology Consult would happen via TeleHealth consult by way of interactive audio and video telecommunications and consented to receiving care in this manner.  Due to the immediate potential for life-threatening deterioration due to underlying acute neurologic illness, I spent 35 minutes providing critical care. This time includes time for face to face visit via telemedicine, review of medical records, imaging studies and discussion of findings with providers, the patient and/or family.   Dr Leda Quail   [facility] [phone]   Case [id number]

## 2019-01-15 NOTE — ED Triage Notes (Signed)
Pt woke at 0500 by phone and felt normal at 0640 woke and felt funny on right side and had difficulty speaking.  Pt feels speech has improved but not normal.

## 2019-01-16 MED ORDER — ASPIRIN 81 MG PO TBEC: 81.0000 mg | DELAYED_RELEASE_TABLET | Freq: Every day | ORAL | 0 refills | Status: AC

## 2019-01-16 MED ORDER — CLOPIDOGREL BISULFATE 75 MG PO TABS: 75.0000 mg | ORAL_TABLET | Freq: Every day | ORAL | 0 refills | Status: AC

## 2019-01-16 MED ORDER — ASPIRIN EC 81 MG PO TBEC
81.0000 mg | DELAYED_RELEASE_TABLET | Freq: Every day | ORAL | Status: DC
  Administered 2019-01-16: 11:00:00 81 mg via ORAL
  Filled 2019-01-16: qty 1

## 2019-01-16 NOTE — Plan of Care (Signed)
  Problem: Education: Goal: Knowledge of disease or condition will improve Outcome: Adequate for Discharge Goal: Knowledge of secondary prevention will improve Outcome: Adequate for Discharge Goal: Knowledge of patient specific risk factors addressed and post discharge goals established will improve Outcome: Adequate for Discharge   Problem: Coping: Goal: Will verbalize positive feelings about self Outcome: Adequate for Discharge Goal: Will identify appropriate support needs Outcome: Adequate for Discharge   Problem: Health Behavior/Discharge Planning: Goal: Ability to manage health-related needs will improve Outcome: Adequate for Discharge   Problem: Self-Care: Goal: Ability to participate in self-care as condition permits will improve Outcome: Adequate for Discharge Goal: Verbalization of feelings and concerns over difficulty with self-care will improve Outcome: Adequate for Discharge Goal: Ability to communicate needs accurately will improve Outcome: Adequate for Discharge   Problem: Nutrition: Goal: Risk of aspiration will decrease Outcome: Adequate for Discharge   Problem: Ischemic Stroke/TIA Tissue Perfusion: Goal: Complications of ischemic stroke/TIA will be minimized Outcome: Adequate for Discharge

## 2019-01-16 NOTE — Plan of Care (Signed)
  Problem: Education: Goal: Knowledge of disease or condition will improve 01/16/2019 1508 by Jenene Slicker, RN Outcome: Adequate for Discharge 01/16/2019 1508 by Jenene Slicker, RN Outcome: Adequate for Discharge Goal: Knowledge of secondary prevention will improve 01/16/2019 1508 by Jenene Slicker, RN Outcome: Adequate for Discharge 01/16/2019 1508 by Jenene Slicker, RN Outcome: Adequate for Discharge Goal: Knowledge of patient specific risk factors addressed and post discharge goals established will improve 01/16/2019 1508 by Jenene Slicker, RN Outcome: Adequate for Discharge 01/16/2019 1508 by Jenene Slicker, RN Outcome: Adequate for Discharge   Problem: Coping: Goal: Will verbalize positive feelings about self 01/16/2019 1508 by Jenene Slicker, RN Outcome: Adequate for Discharge 01/16/2019 1508 by Jenene Slicker, RN Outcome: Adequate for Discharge Goal: Will identify appropriate support needs 01/16/2019 1508 by Jenene Slicker, RN Outcome: Adequate for Discharge 01/16/2019 1508 by Jenene Slicker, RN Outcome: Adequate for Discharge   Problem: Health Behavior/Discharge Planning: Goal: Ability to manage health-related needs will improve 01/16/2019 1508 by Jenene Slicker, RN Outcome: Adequate for Discharge 01/16/2019 1508 by Jenene Slicker, RN Outcome: Adequate for Discharge   Problem: Self-Care: Goal: Ability to participate in self-care as condition permits will improve 01/16/2019 1508 by Jenene Slicker, RN Outcome: Adequate for Discharge 01/16/2019 1508 by Jenene Slicker, RN Outcome: Adequate for Discharge Goal: Verbalization of feelings and concerns over difficulty with self-care will improve 01/16/2019 1508 by Jenene Slicker, RN Outcome: Adequate for Discharge 01/16/2019 1508 by Jenene Slicker, RN Outcome: Adequate for Discharge Goal: Ability to communicate needs accurately will improve 01/16/2019 1508 by Jenene Slicker, RN Outcome: Adequate for  Discharge 01/16/2019 1508 by Jenene Slicker, RN Outcome: Adequate for Discharge   Problem: Nutrition: Goal: Risk of aspiration will decrease 01/16/2019 1508 by Jenene Slicker, RN Outcome: Adequate for Discharge 01/16/2019 1508 by Jenene Slicker, RN Outcome: Adequate for Discharge   Problem: Ischemic Stroke/TIA Tissue Perfusion: Goal: Complications of ischemic stroke/TIA will be minimized 01/16/2019 1508 by Jenene Slicker, RN Outcome: Adequate for Discharge 01/16/2019 1508 by Jenene Slicker, RN Outcome: Adequate for Discharge

## 2019-01-16 NOTE — Discharge Summary (Signed)
[facility] NAME: Paul Cannon    MR#:  [id number]  DATE OF BIRTH:  Jul 21, 1960  DATE OF ADMISSION:  01/15/2019 ADMITTING PHYSICIAN: Lang Snow, NP  DATE OF DISCHARGE: 01/16/2019  PRIMARY CARE PHYSICIAN: Venita Lick, NP   ADMISSION DIAGNOSIS:  TIA (transient ischemic attack) [G45.9]  DISCHARGE DIAGNOSIS:  Transient ischemic attack Hypertension Hyperlipidemia Hypokalemia Obstructive sleep apnea  SECONDARY DIAGNOSIS:   Past Medical History:  Diagnosis Date  . Hyperlipidemia   . Hypertension   . Obesity   . Sleep apnea      ADMITTING HISTORY 58 year old male with past medical history of hypertension, hyperlipidemia, morbid obesity, and sleep apnea on CPAP presenting to the ED with right facial droop and slurred speech. Patient and patient's wife report that alarm company called at around 0500 this morning and woke him up, per wife patient was " normal" and at baseline.  He went back to bed and woke up at around 6:30am " not feeling right".  Patient states he went to the bathroom and noticed that his right hand was shaking and difficulty with coordination.  He tried to use the toothbrush but noticed that he was having some difficulty.  Patient states he proceeded to take a bath and while showering he was making " unusual gibberish sound".  Patient states when he got out of the shower he felt a warm rash all over his body associated with nausea without vomiting.  He went to tell his wife that he was leaving but  he noticed that he was having difficulty getting his words out.  Patient's wife also noticed that he was slurring his speech with right facial droop they therefore decided to come to the ED for further evaluation. On arrival to the ED, he was afebrile with blood pressure 150/74 mm Hg and pulse rate 53 beats/min. He was noted to have right facial droop otherwise no other focal neurological deficit; he was  alert and oriented x4, and he did not demonstrate any memory deficits.  Initial NIH stroke scale 1.  Code stroke was initiated and patient evaluated by a tele-neurologist.  Noncontrast CT head was obtained and showed no acute intracranial abnormality.  Follow-up CTA head and neck showed no large vessel occlusion.  Per tele-neurologist clinical presentation was not suggestive of large vessel occlusive disease therefore patient was not a candidate for thrombectomy. He was also not deemed candidate for tPA thrombolytics because of resolved symptoms with no residual disabling symptoms. Patient will be admitted under hospitalist service for further stroke work up.  HOSPITAL COURSE:  Patient admitted to medical floor.. Patient was worked up with CT head, MRI brain and CT angiogram of the neck.  MRI brain revealed multiple old lacunar infarcts, no acute abnormalities.  No acute findings found on the CTA head and neck. patient was worked up with echocardiogram showed EF of 50 to 55% and no shunt noted on bubble study.  Patient was seen by neurology.  Recommended aspirin and Plavix orally.  Plavix to continue for 3 weeks and aspirin indefinitely.  Patient will continue medications for hyperlipidemia and there is statin and blood pressure medications.  He will follow-up with neurology in the clinic.  Patient also had bradycardia with first-degree AV block.  EKGs were done and case was discussed with [facility] clinic cardiology Dr Nehemiah Massed ,who recommended outpatient follow-up and no acute intervention.  CONSULTS OBTAINED:  Treatment Team:  Alexis Goodell, MD Catarina Hartshorn, MD  DRUG ALLERGIES:   Allergies  Allergen Reactions  . Percocet [Oxycodone-Acetaminophen] Diarrhea and Nausea And Vomiting  . Vicodin [Hydrocodone-Acetaminophen] Diarrhea and Nausea And Vomiting    DISCHARGE MEDICATIONS:   Allergies as of 01/16/2019      Reactions   Percocet [oxycodone-acetaminophen] Diarrhea, Nausea And Vomiting    Vicodin [hydrocodone-acetaminophen] Diarrhea, Nausea And Vomiting      Medication List    STOP taking these medications   methocarbamol 750 MG tablet Commonly known as: ROBAXIN     TAKE these medications   amLODipine 10 MG tablet Commonly known as: NORVASC TAKE 1 TABLET BY MOUTH ONCE DAILY   aspirin 81 MG EC tablet Take 1 tablet (81 mg total) by mouth daily.   atorvastatin 10 MG tablet Commonly known as: LIPITOR TAKE 1 TABLET BY MOUTH ONCE DAILY What changed: when to take this   clopidogrel 75 MG tablet Commonly known as: PLAVIX Take 1 tablet (75 mg total) by mouth daily for 21 days.   cyclobenzaprine 10 MG tablet Commonly known as: FLEXERIL TAKE 1 TABLET BY MOUTH AT BEDTIME   ONE-A-DAY MENS 50+ ADVANTAGE PO Take 1 tablet by mouth daily.   telmisartan 80 MG tablet Commonly known as: MICARDIS TAKE 1 TABLET BY MOUTH ONCE DAILY       Today  Patient seen today No tingling or numbness No slurred speech No weakness in any part of the body Hemodynamically stable VITAL SIGNS:  Blood pressure 140/68, pulse (!) 55, temperature 98.1 F (36.7 C), temperature source Oral, resp. rate 18, height 6' (1.829 m), weight (!) 146.7 kg, SpO2 96 %.  I/O:    Intake/Output Summary (Last 24 hours) at 01/16/2019 1415 Last data filed at 01/16/2019 1300 Gross per 24 hour  Intake 720 ml  Output -  Net 720 ml    PHYSICAL EXAMINATION:  Physical Exam  GENERAL:  58 y.o.-year-old patient lying in the bed with no acute distress.  LUNGS: Normal breath sounds bilaterally, no wheezing, rales,rhonchi or crepitation. No use of accessory muscles of respiration.  CARDIOVASCULAR: S1, S2 normal. No murmurs, rubs, or gallops.  ABDOMEN: Soft, non-tender, non-distended. Bowel sounds present. No organomegaly or mass.  NEUROLOGIC: Moves all 4 extremities. PSYCHIATRIC: The patient is alert and oriented x 3.  SKIN: No obvious rash, lesion, or ulcer.   DATA REVIEW:   CBC Recent Labs  Lab  01/15/19 0754  WBC 4.5  HGB 13.8  HCT 41.4  PLT 189    Chemistries  Recent Labs  Lab 01/15/19 0754  NA 140  K 3.3*  CL 104  CO2 25  GLUCOSE 152*  BUN 16  CREATININE 0.89  CALCIUM 8.7*  AST 39  ALT 54*  ALKPHOS 71  BILITOT 0.7    Cardiac Enzymes No results for input(s): TROPONINI in the last 168 hours.  Microbiology Results  Results for orders placed or performed during the hospital encounter of 01/15/19  SARS Coronavirus 2 [facility] order, Performed in [facility] hospital lab) Nasopharyngeal Nasopharyngeal Swab     Status: None   Collection Time: 01/15/19  7:54 AM   Specimen: Nasopharyngeal Swab  Result Value Ref Range Status   SARS Coronavirus 2 NEGATIVE NEGATIVE Final    Comment: (NOTE) If result is NEGATIVE SARS-CoV-2 target nucleic acids are NOT DETECTED. The SARS-CoV-2 RNA is generally detectable in upper and lower  respiratory specimens during the acute phase of infection. The lowest  concentration of SARS-CoV-2 viral copies this assay can detect is 250  copies / mL. A  negative result does not preclude SARS-CoV-2 infection  and should not be used as the sole basis for treatment or other  patient management decisions.  A negative result may occur with  improper specimen collection / handling, submission of specimen other  than nasopharyngeal swab, presence of viral mutation(s) within the  areas targeted by this assay, and inadequate number of viral copies  (<250 copies / mL). A negative result must be combined with clinical  observations, patient history, and epidemiological information. If result is POSITIVE SARS-CoV-2 target nucleic acids are DETECTED. The SARS-CoV-2 RNA is generally detectable in upper and lower  respiratory specimens dur ing the acute phase of infection.  Positive  results are indicative of active infection with SARS-CoV-2.  Clinical  correlation with patient history and other diagnostic information is  necessary to determine patient  infection status.  Positive results do  not rule out bacterial infection or co-infection with other viruses. If result is PRESUMPTIVE POSTIVE SARS-CoV-2 nucleic acids MAY BE PRESENT.   A presumptive positive result was obtained on the submitted specimen  and confirmed on repeat testing.  While 2019 novel coronavirus  (SARS-CoV-2) nucleic acids may be present in the submitted sample  additional confirmatory testing may be necessary for epidemiological  and / or clinical management purposes  to differentiate between  SARS-CoV-2 and other Sarbecovirus currently known to infect humans.  If clinically indicated additional testing with an alternate test  methodology [phone]) is advised. The SARS-CoV-2 RNA is generally  detectable in upper and lower respiratory sp ecimens during the acute  phase of infection. The expected result is Negative. Fact Sheet for Patients:  StrictlyIdeas.no Fact Sheet for Healthcare Providers: BankingDealers.co.za This test is not yet approved or cleared by the [place] FDA and has been authorized for detection and/or diagnosis of SARS-CoV-2 by FDA under an Emergency Use Authorization (EUA).  This EUA will remain in effect (meaning this test can be used) for the duration of the COVID-19 declaration under Section 564(b)(1) of the Act, 21 U.S.C. section 360bbb-3(b)(1), unless the authorization is terminated or revoked sooner. Performed at [facility], [address] [place], [place] [address]     RADIOLOGY:  Ct Angio Head W Or Wo Contrast  Result Date: 01/15/2019 CLINICAL DATA:  Slurred speech with right arm numbness EXAM: CT ANGIOGRAPHY HEAD AND NECK TECHNIQUE: Multidetector CT imaging of the head and neck was performed using the standard protocol during bolus administration of intravenous contrast. Multiplanar CT image reconstructions and MIPs were obtained to evaluate the vascular anatomy. Carotid  stenosis measurements (when applicable) are obtained utilizing NASCET criteria, using the distal internal carotid diameter as the denominator. CONTRAST:  26mL OMNIPAQUE IOHEXOL 350 MG/ML SOLN COMPARISON:  Noncontrast head CT earlier today FINDINGS: CTA NECK FINDINGS Aortic arch: Mild calcified atherosclerosis. Three vessel branching. Right carotid system: Moderate mainly calcified plaque at the bifurcation without ulceration or flow limiting stenosis. Left carotid system: Moderate calcified plaque at the bifurcation without stenosis or ulceration. Vertebral arteries: No proximal subclavian stenosis. Both vertebral arteries are smooth and widely patent to the dura. 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 Stable mass below the left thyroid gland measuring 25 mm in maximum on axial slices. Upper chest: Negative Review of the MIP images confirms the above findings CTA HEAD FINDINGS Anterior circulation: Calcified plaque on the carotid siphons. No branch occlusion or flow limiting stenosis. Negative for aneurysm or beading. Posterior circulation:  Vessels are smooth and diffusely patent. Negative for aneurysm Venous sinuses: Patent Anatomic variants: None significant Review of the MIP images confirms the above findings IMPRESSION: 1. No emergent finding. 2. Carotid atherosclerosis without flow limiting stenosis or ulceration. 3. 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 No detected growth when compared to 2019 neck CT. Electronically Signed   By: Monte Fantasia M.D.   On: 01/15/2019 08:52   Ct Angio Neck W And/or Wo Contrast  Result Date: 01/15/2019 CLINICAL DATA:  Slurred speech with right arm numbness EXAM: CT ANGIOGRAPHY HEAD AND NECK TECHNIQUE: Multidetector CT imaging of the head and neck was performed using the standard protocol during bolus administration of intravenous contrast. Multiplanar CT image  reconstructions and MIPs were obtained to evaluate the vascular anatomy. Carotid stenosis measurements (when applicable) are obtained utilizing NASCET criteria, using the distal internal carotid diameter as the denominator. CONTRAST:  67mL OMNIPAQUE IOHEXOL 350 MG/ML SOLN COMPARISON:  Noncontrast head CT earlier today FINDINGS: CTA NECK FINDINGS Aortic arch: Mild calcified atherosclerosis. Three vessel branching. Right carotid system: Moderate mainly calcified plaque at the bifurcation without ulceration or flow limiting stenosis. Left carotid system: Moderate calcified plaque at the bifurcation without stenosis or ulceration. Vertebral arteries: No proximal subclavian stenosis. Both vertebral arteries are smooth and widely patent to the dura. 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 Stable mass below the left thyroid gland measuring 25 mm in maximum on axial slices. Upper chest: Negative Review of the MIP images confirms the above findings CTA HEAD FINDINGS Anterior circulation: Calcified plaque on the carotid siphons. No branch occlusion or flow limiting stenosis. Negative for aneurysm or beading. Posterior circulation: Vessels are smooth and diffusely patent. Negative for aneurysm Venous sinuses: Patent Anatomic variants: None significant Review of the MIP images confirms the above findings IMPRESSION: 1. No emergent finding. 2. Carotid atherosclerosis without flow limiting stenosis or ulceration. 3. 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 No detected growth when compared to 2019 neck CT. Electronically Signed   By: Monte Fantasia M.D.   On: 01/15/2019 08:52   Mr Brain Wo Contrast  Result Date: 01/15/2019 CLINICAL DATA:  Stroke, follow-up EXAM: MRI HEAD WITHOUT CONTRAST TECHNIQUE: Multiplanar, multiecho pulse sequences of the brain and surrounding structures were obtained without intravenous contrast.  COMPARISON:  Noncontrast head CT and CT angiogram head/neck 01/15/2019, FINDINGS: Brain: No restricted diffusion is demonstrated to suggest acute or recent subacute infarction. Small chronic lacunar infarcts within the right frontal lobe white matter, right corpus callosum, left caudate nucleus, bilateral thalami, and bilateral cerebellar hemispheres. Additional moderate scattered and confluent T2/FLAIR hyperintensity within the cerebral white matter is nonspecific, but consistent with chronic small vessel ischemic disease. No evidence of intracranial mass. Punctate focus of SWI signal loss within the right temporal occipit al lobe consistent with chronic microhemorrhage. No midline shift or extra-axial collection. Cerebral volume is normal. Vascular: Flow voids maintained within the proximal large vessels. Skull and upper cervical spine: Normal marrow signal. Sinuses/Orbits: The imaged globes and orbits demonstrate no acute abnormality. No significant paranasal sinus disease or mastoid effusion. IMPRESSION: No evidence of acute intracranial abnormality. Specifically, no evidence of acute or recent subacute infarction. Moderate chronic small vessel ischemic disease. 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 Electronically Signed   By: Kellie Simmering   On: 01/15/2019 11:48   Ct Head Code Stroke Wo Contrast  Result Date: 01/15/2019 CLINICAL  DATA:  Code stroke.  Slurred speech and right arm numbness EXAM: CT HEAD WITHOUT CONTRAST TECHNIQUE: Contiguous axial images were obtained from the base of the skull through the vertex without intravenous contrast. COMPARISON:  None. FINDINGS: Brain: Chronic appearing (well-defined and low-density) lacunar infarcts in the left caudate head, bilateral thalamus, and right corpus callosum. Two small remote left cerebellar infarcts. Mild patchy low-density in the cerebral white matter. No evidence of acute gray matter infarct. No  hemorrhage, hydrocephalus, or collection. Vascular: Atherosclerotic calcification.  No hyperdense vessel. Skull: Negative Sinuses/Orbits: Negative These results were called by telephone at the time of interpretation on 01/15/2019 at 7:45 am to Dr. Merlyn Lot , who verbally acknowledged these results. ASPECTS [facility] Stroke Program Early CT Score) - Ganglionic level infarction (caudate, lentiform nuclei, internal capsule, insula, M1-M3 cortex): 7 - Supraganglionic infarction (M4-M6 cortex): 3 Total score (0-10 with 10 being normal): 10 IMPRESSION: 1. No acute finding. 2. 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 Electronically Signed   By: Monte Fantasia M.D.   On: 01/15/2019 07:49    Follow up with PCP in 1 week.  Management plans discussed with the patient, family and they are in agreement.  CODE STATUS: Full code    Code Status Orders  (From admission, onward)         Start     Ordered   01/15/19 0911  Full code  Continuous     01/15/19 0912        Code Status History    This patient has a current code status but no historical code status.   Advance Care Planning Activity      TOTAL TIME TAKING CARE OF THIS PATIENT ON DAY OF DISCHARGE: more than 35 minutes.   Saundra Shelling M.D on 01/16/2019 at 2:15 PM  Between 7am to 6pm - Pager - [phone]  After 6pm go to [email or web address] - password [email or web address] [facility]  Office  [phone]  CC: Primary care physician; Venita Lick, NP  Note: This dictation was prepared with Dragon dictation along with smaller phrase technology. Any transcriptional errors that result from this process are unintentional.

## 2019-01-16 NOTE — Consult Note (Addendum)
[facility]  CARDIOLOGY CONSULT NOTE  Patient ID: Paul Cannon MRN: [id number] DOB/AGE: March 24, 1961 58 y.o.  Admit date: 01/15/2019 Referring Physician Saundra Shelling, MD Primary Physician Marnee Guarneri, NP Primary Cardiologist None Reason for Consultation Evaluation for Heart Block  HPI:  Paul Cannon is a 58 y.o. with a past medical history of sleep apnea on nightly CPAP, obesity, pre-diabetes, HTN, and HLD, who presented to the ED yesterday for word finding difficulty and right sided weakness. Non-contract CT head without acute abnormalities. Symptoms begun resolving shortly after ED presentation, so his symptoms were thought to be secondary to a TIA. He is now admitted for observation and neurologic evaluation. Last night, he had an episode of bradycardia where heart rate dropped to 39 BPM. Cardiology was consulted to evaluate for heart block. Review of telemetry strip from last night's event shows she was sin sinus rhythm with first degree AV block. There was no evidence of complete or high grade heart block. The patient was sleeping when this event occurred, so he was not aware of it. He feels well currently and has no lightheadedness, fatigue, syncope, chest pain, or shortness of breath.  Review of systems complete and found to be negative unless listed above   Past Medical History:  Diagnosis Date  . Hyperlipidemia   . Hypertension   . Obesity   . Sleep apnea     Past Surgical History:  Procedure Laterality Date  . COLONOSCOPY WITH PROPOFOL N/A 11/17/2015   Procedure: COLONOSCOPY WITH PROPOFOL;  Surgeon: [name], MD;  Location: [facility] ENDOSCOPY;  Service: Endoscopy;  Laterality: N/A;  . SHOULDER SURGERY      Medications Prior to Admission  Medication Sig Dispense Refill Last Dose  . amLODipine (NORVASC) 10 MG tablet TAKE 1 TABLET BY MOUTH ONCE DAILY (Patient taking differently: Take 10 mg by mouth daily. ) 90 tablet 0 01/14/2019 at 0600  . atorvastatin (LIPITOR)  10 MG tablet TAKE 1 TABLET BY MOUTH ONCE DAILY (Patient taking differently: Take 10 mg by mouth daily at 6 PM. ) 90 tablet 0 01/14/2019 at 1800  . cyclobenzaprine (FLEXERIL) 10 MG tablet TAKE 1 TABLET BY MOUTH AT BEDTIME (Patient taking differently: Take 10 mg by mouth at bedtime. ) 30 tablet 5 01/14/2019 at 2100  . Multiple Vitamins-Minerals (ONE-A-DAY MENS 50+ ADVANTAGE PO) Take 1 tablet by mouth daily.   01/14/2019 at 0600  . telmisartan (MICARDIS) 80 MG tablet TAKE 1 TABLET BY MOUTH ONCE DAILY (Patient taking differently: Take 80 mg by mouth daily. ) 30 tablet 0 01/14/2019 at 0600  . methocarbamol (ROBAXIN) 750 MG tablet Take 1 tablet (750 mg total) by mouth every 6 (six) hours as needed. (Patient not taking: Reported on 01/15/2019) 30 tablet 1 Completed Course at Unknown time   Social History   Socioeconomic History  . Marital status: Married    Spouse name: Not on file  . Number of children: Not on file  . Years of education: Not on file  . Highest education level: Not on file  Occupational History  . Not on file  Social Needs  . Financial resource strain: Not on file  . Food insecurity    Worry: Not on file    Inability: Not on file  . Transportation needs    Medical: Not on file    Non-medical: Not on file  Tobacco Use  . Smoking status: Former Smoker    Quit date: 09/30/2009    Years since quitting: 9.3  . Smokeless tobacco: Former  User  Substance and Sexual Activity  . Alcohol use: Yes    Comment: pt states occasional or very rarely  . Drug use: No  . Sexual activity: Yes  Lifestyle  . Physical activity    Days per week: Not on file    Minutes per session: Not on file  . Stress: Not on file  Relationships  . Social Herbalist on phone: Not on file    Gets together: Not on file    Attends religious service: Not on file    Active member of club or organization: Not on file    Attends meetings of clubs or organizations: Not on file    Relationship status: Not  on file  . Intimate partner violence    Fear of current or ex partner: Not on file    Emotionally abused: Not on file    Physically abused: Not on file    Forced sexual activity: Not on file  Other Topics Concern  . Not on file  Social History Narrative  . Not on file    Family History  Problem Relation Age of Onset  . Arthritis Mother   . Hyperlipidemia Mother   . Hypertension Mother   . Migraines Mother   . Heart disease Father        MI  . Stroke Father   . Lung disease Father   . Cancer Sister        breast  . Cancer Brother        melanoma  . Stroke Sister      Review of systems complete and found to be negative unless listed above     PHYSICAL EXAM  General: Obese. Well developed, well nourished, in no acute distress HEENT:  Normocephalic and atramatic Neck:  No JVD.  Lungs: Clear bilaterally to auscultation and percussion. Heart: HRRR . Normal S1 and S2 without gallops or murmurs.  Extremities: No clubbing, cyanosis or edema.   Neuro: Alert and oriented X 3. Psych:  Good affect, responds appropriately  Labs:   Lab Results  Component Value Date   WBC 4.5 01/15/2019   HGB 13.8 01/15/2019   HCT 41.4 01/15/2019   MCV 85.9 01/15/2019   PLT 189 01/15/2019    Recent Labs  Lab 01/15/19 0754  NA 140  K 3.3*  CL 104  CO2 25  BUN 16  CREATININE 0.89  CALCIUM 8.7*  PROT 7.3  BILITOT 0.7  ALKPHOS 71  ALT 54*  AST 39  GLUCOSE 152*   No results found for: CKTOTAL, CKMB, CKMBINDEX, TROPONINI  Lab Results  Component Value Date   CHOL 145 01/15/2019   CHOL 147 12/31/2018   CHOL 134 07/24/2017   Lab Results  Component Value Date   HDL 38 (L) 01/15/2019   HDL 39 (L) 12/31/2018   HDL 39 (L) 07/24/2017   Lab Results  Component Value Date   LDLCALC 81 01/15/2019   LDLCALC 70 12/31/2018   LDLCALC 68 07/24/2017   Lab Results  Component Value Date   TRIG 132 01/15/2019   TRIG 192 (H) 12/31/2018   TRIG 135 07/24/2017   Lab Results  Component  Value Date   CHOLHDL 3.8 01/15/2019   No results found for: LDLDIRECT    Radiology: Ct Angio Head W Or Wo Contrast  Result Date: 01/15/2019 CLINICAL DATA:  Slurred speech with right arm numbness EXAM: CT ANGIOGRAPHY HEAD AND NECK TECHNIQUE: Multidetector CT imaging of the head and neck  was performed using the standard protocol during bolus administration of intravenous contrast. Multiplanar CT image reconstructions and MIPs were obtained to evaluate the vascular anatomy. Carotid stenosis measurements (when applicable) are obtained utilizing NASCET criteria, using the distal internal carotid diameter as the denominator. CONTRAST:  67mL OMNIPAQUE IOHEXOL 350 MG/ML SOLN COMPARISON:  Noncontrast head CT earlier today FINDINGS: CTA NECK FINDINGS Aortic arch: Mild calcified atherosclerosis. Three vessel branching. Right carotid system: Moderate mainly calcified plaque at the bifurcation without ulceration or flow limiting stenosis. Left carotid system: Moderate calcified plaque at the bifurcation without stenosis or ulceration. Vertebral arteries: No proximal subclavian stenosis. Both vertebral arteries are smooth and widely patent to the dura. 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 Stable mass below the left thyroid gland measuring 25 mm in maximum on axial slices. Upper chest: Negative Review of the MIP images confirms the above findings CTA HEAD FINDINGS Anterior circulation: Calcified plaque on the carotid siphons. No branch occlusion or flow limiting stenosis. Negative for aneurysm or beading. Posterior circulation: Vessels are smooth and diffusely patent. Negative for aneurysm Venous sinuses: Patent Anatomic variants: None significant Review of the MIP images confirms the above findings IMPRESSION: 1. No emergent finding. 2. Carotid atherosclerosis without flow limiting stenosis or ulceration. 3. 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 No detected growth when compared to 2019 neck CT. Electronically Signed   By: Monte Fantasia M.D.   On: 01/15/2019 08:52   Ct Angio Neck W And/or Wo Contrast  Result Date: 01/15/2019 CLINICAL DATA:  Slurred speech with right arm numbness EXAM: CT ANGIOGRAPHY HEAD AND NECK TECHNIQUE: Multidetector CT imaging of the head and neck was performed using the standard protocol during bolus administration of intravenous contrast. Multiplanar CT image reconstructions and MIPs were obtained to evaluate the vascular anatomy. Carotid stenosis measurements (when applicable) are obtained utilizing NASCET criteria, using the distal internal carotid diameter as the denominator. CONTRAST:  29mL OMNIPAQUE IOHEXOL 350 MG/ML SOLN COMPARISON:  Noncontrast head CT earlier today FINDINGS: CTA NECK FINDINGS Aortic arch: Mild calcified atherosclerosis. Three vessel branching. Right carotid system: Moderate mainly calcified plaque at the bifurcation without ulceration or flow limiting stenosis. Left carotid system: Moderate calcified plaque at the bifurcation without stenosis or ulceration. Vertebral arteries: No proximal subclavian stenosis. Both vertebral arteries are smooth and widely patent to the dura. 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 Stable mass below the left thyroid gland measuring 25 mm in maximum on axial slices. Upper chest: Negative Review of the MIP images confirms the above findings CTA HEAD FINDINGS Anterior circulation: Calcified plaque on the carotid siphons. No branch occlusion or flow limiting stenosis. Negative for aneurysm or beading. Posterior circulation: Vessels are smooth and diffusely patent. Negative for aneurysm Venous sinuses: Patent Anatomic variants: None significant Review of the MIP images confirms the above findings IMPRESSION: 1. No emergent finding. 2. Carotid atherosclerosis  without flow limiting stenosis or ulceration. 3. 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 No detected growth when compared to 2019 neck CT. Electronically Signed   By: Monte Fantasia M.D.   On: 01/15/2019 08:52   Mr Brain Wo Contrast  Result Date: 01/15/2019 CLINICAL DATA:  Stroke, follow-up EXAM: MRI HEAD WITHOUT CONTRAST TECHNIQUE: Multiplanar, multiecho pulse sequences of the brain and surrounding structures were obtained without intravenous contrast. COMPARISON:  Noncontrast head CT  and CT angiogram head/neck 01/15/2019, FINDINGS: Brain: No restricted diffusion is demonstrated to suggest acute or recent subacute infarction. Small chronic lacunar infarcts within the right frontal lobe white matter, right corpus callosum, left caudate nucleus, bilateral thalami, and bilateral cerebellar hemispheres. Additional moderate scattered and confluent T2/FLAIR hyperintensity within the cerebral white matter is nonspecific, but consistent with chronic small vessel ischemic disease. No evidence of intracranial mass. Punctate focus of SWI signal loss within the right temporal occipit al lobe consistent with chronic microhemorrhage. No midline shift or extra-axial collection. Cerebral volume is normal. Vascular: Flow voids maintained within the proximal large vessels. Skull and upper cervical spine: Normal marrow signal. Sinuses/Orbits: The imaged globes and orbits demonstrate no acute abnormality. No significant paranasal sinus disease or mastoid effusion. IMPRESSION: No evidence of acute intracranial abnormality. Specifically, no evidence of acute or recent subacute infarction. Moderate chronic small vessel ischemic disease. 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 Electronically Signed   By: Kellie Simmering   On: 01/15/2019 11:48   Ct Head Code Stroke Wo Contrast  Result Date: 01/15/2019 CLINICAL DATA:  Code stroke.  Slurred speech and right arm  numbness EXAM: CT HEAD WITHOUT CONTRAST TECHNIQUE: Contiguous axial images were obtained from the base of the skull through the vertex without intravenous contrast. COMPARISON:  None. FINDINGS: Brain: Chronic appearing (well-defined and low-density) lacunar infarcts in the left caudate head, bilateral thalamus, and right corpus callosum. Two small remote left cerebellar infarcts. Mild patchy low-density in the cerebral white matter. No evidence of acute gray matter infarct. No hemorrhage, hydrocephalus, or collection. Vascular: Atherosclerotic calcification.  No hyperdense vessel. Skull: Negative Sinuses/Orbits: Negative These results were called by telephone at the time of interpretation on 01/15/2019 at 7:45 am to Dr. Merlyn Lot , who verbally acknowledged these results. ASPECTS [facility] Stroke Program Early CT Score) - Ganglionic level infarction (caudate, lentiform nuclei, internal capsule, insula, M1-M3 cortex): 7 - Supraganglionic infarction (M4-M6 cortex): 3 Total score (0-10 with 10 being normal): 10 IMPRESSION: 1. No acute finding. 2. 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 Electronically Signed   By: Monte Fantasia M.D.   On: 01/15/2019 07:49    EKG: Sinus rhythm, rate of 58 BPM, normal axis, first degree AV block with PR interval of 238 ms, no significant ST-T wave changes  ASSESSMENT AND PLAN:  Patient is a 58 year old male with known history of sleep apnea, on nightly CPAP, HTN, HLD, and pre-diabetes but no known cardiac history, who presented to the ED for stroke symptoms, thought to have a TIA. Overnight yesterday, during admission, telemetry showed a bradycardic event with heart rate of 39 BPM. Review of telemetry strips did not show any evidence of high grade or complete heart block. He is in sinus rhythm with first degree heart failure on the monitor currently without bradycardia.   1. No evidence of heart block on telemetry strips on the monitor  currently. Will obtain EKG for further evaluation but suspect bradycardia last night was triggered by not having his CPAP machine during his hospitalization. No further cardiac interventions or diagnostics indicated at this time.  2. May follow up with cardiology as outpatient as needed.  3. Please call with any other questions or concerns.   The patient's history and exam findings were discussed with Dr. Nehemiah Massed. The plan was made in conjunction with Dr. Nehemiah Massed.  Signed: Hilbert Odor PA-C 01/16/2019, 12:18 PM The patient has been interviewed and examined. I agree with  assessment and plan above. Serafina Royals MD [facility]

## 2019-01-16 NOTE — Progress Notes (Signed)
Telemetry called and stated that patients HR had got down to 30-39 and had a 1.9 second pause. Made MD aware of same.

## 2019-01-16 NOTE — Progress Notes (Signed)
SLP Cancellation Note  Patient Details Name: Paul Cannon MRN: [id number] DOB: June 21, 1960   Cancelled treatment:       Reason Eval/Treat Not Completed: SLP screened, no needs identified, will sign off(chart reviewed; consulted w/ pt then NSG). Pt denied any difficulty swallowing and is currently on a regular diet; tolerates swallowing pills w/ water per NSG. Pt conversed at conversational level w/out deficits noted; pt and NSG denied any speech-language deficits.  No further skilled ST services indicated as pt appears at her baseline. Pt agreed. NSG to reconsult if any change in status while admitted.      Orinda Kenner, [place], CCC-SLP Watson,Katherine 01/16/2019, 9:31 AM

## 2019-01-16 NOTE — Progress Notes (Signed)
Advanced care plan. Purpose of the Encounter: CODE STATUS Parties in Attendance:Patient Patient's Decision Capacity:Good Subjective/Patient's story: 58 year old male with past medical history of hypertension, hyperlipidemia, morbid obesity, and sleep apnea on CPAP presenting to the ED with right facial droop and slurred speech. Patient and patient's wife report that alarm company called at around 0500 this morning and woke him up, per wife patient was " normal" and at baseline.  He went back to bed and woke up at around 6:30am " not feeling right".  Patient states he went to the bathroom and noticed that his right hand was shaking and difficulty with coordination.  He tried to use the toothbrush but noticed that he was having some difficulty.  Patient states he proceeded to take a bath and while showering he was making " unusual gibberish sound".  Patient states when he got out of the shower he felt a warm rash all over his body associated with nausea without vomiting.  He went to tell his wife that he was leaving but  he noticed that he was having difficulty getting his words out.  Patient's wife also noticed that he was slurring his speech with right facial droop they therefore decided to come to the ED for further evaluation. On arrival to the ED, he was afebrile with blood pressure 150/74 mm Hg and pulse rate 53 beats/min. He was noted to have right facial droop otherwise no other focal neurological deficit; he was alert and oriented x4, and he did not demonstrate any memory deficits.  Initial NIH stroke scale 1.  Code stroke was initiated and patient evaluated by a tele-neurologist.  Noncontrast CT head was obtained and showed no acute intracranial abnormality.  Follow-up CTA head and neck showed no large vessel occlusion.  Per tele-neurologist clinical presentation was not suggestive of large vessel occlusive disease therefore patient was not a candidate for thrombectomy. He was also not deemed  candidate for tPA thrombolytics because of resolved symptoms with no residual disabling symptoms. Patient will be admitted under hospitalist service for further stroke work up. Objective/Medical story Patient needs work-up with CT head, CTA head and neck and MRI brain Needs formal neurology evaluation Needs management for TIA physical therapy and Occupational Therapy and speech therapy consult. Goals of care determination:  Advance care directives goals of care and treatment plan discussed For now patient is full resuscitation CODE STATUS: Full code Time spent discussing advanced care planning: 16 minutes

## 2019-01-16 NOTE — Progress Notes (Signed)
Subjective: No new neurological complaints.  Patient was stable overnight.    Objective: Current vital signs: BP 140/68 (BP Location: Left Arm)   Pulse (!) 55   Temp 98.1 F (36.7 C) (Oral)   Resp 18   Ht 6' (1.829 m)   Wt (!) 146.7 kg   SpO2 96%   BMI 43.86 kg/m  Vital signs in last 24 hours: Temp:  [97.5 F (36.4 C)-98.5 F (36.9 C)] 98.1 F (36.7 C) (08/12 0440) Pulse Rate:  [52-60] 55 (08/12 0440) Resp:  [18] 18 (08/11 1829) BP: (140-159)/(64-88) 140/68 (08/12 0440) SpO2:  [94 %-99 %] 96 % (08/12 0440)  Intake/Output from previous day: 08/11 0701 - 08/12 0700 In: 240 [P.O.:240] Out: -  Intake/Output this shift: No intake/output data recorded. Nutritional status:  Diet Order            Diet Heart Room service appropriate? Yes; Fluid consistency: Thin  Diet effective now              Neurologic Exam: Mental Status: Alert, oriented, thought content appropriate.  Speech fluent without evidence of aphasia.  Able to follow 3 step commands without difficulty. Cranial Nerves: II: Discs flat bilaterally; Visual fields grossly normal, pupils equal, round, reactive to light and accommodation III,IV, VI: ptosis not present, extra-ocular motions intact bilaterally V,VII: smile symmetric, facial light touch sensation normal bilaterally VIII: hearing normal bilaterally IX,X: gag reflex present XI: bilateral shoulder shrug XII: midline tongue extension Motor: 5/5 throughout; no atrophy noted Sensory: Pinprick and light touch intact throughout, bilaterally   Lab Results: Basic Metabolic Panel: Recent Labs  Lab 01/15/19 0754  NA 140  K 3.3*  CL 104  CO2 25  GLUCOSE 152*  BUN 16  CREATININE 0.89  CALCIUM 8.7*    Liver Function Tests: Recent Labs  Lab 01/15/19 0754  AST 39  ALT 54*  ALKPHOS 71  BILITOT 0.7  PROT 7.3  ALBUMIN 3.9   No results for input(s): LIPASE, AMYLASE in the last 168 hours. No results for input(s): AMMONIA in the last 168  hours.  CBC: Recent Labs  Lab 01/15/19 0754  WBC 4.5  NEUTROABS 2.6  HGB 13.8  HCT 41.4  MCV 85.9  PLT 189    Cardiac Enzymes: No results for input(s): CKTOTAL, CKMB, CKMBINDEX, TROPONINI in the last 168 hours.  Lipid Panel: Recent Labs  Lab 01/15/19 0754  CHOL 145  TRIG 132  HDL 38*  CHOLHDL 3.8  VLDL 26  LDLCALC 81    CBG: Recent Labs  Lab 01/15/19 0732  GLUCAP 142*    Microbiology: Results for orders placed or performed during the hospital encounter of 01/15/19  SARS Coronavirus 2 [facility] order, Performed in [facility] hospital lab) Nasopharyngeal Nasopharyngeal Swab     Status: None   Collection Time: 01/15/19  7:54 AM   Specimen: Nasopharyngeal Swab  Result Value Ref Range Status   SARS Coronavirus 2 NEGATIVE NEGATIVE Final    Comment: (NOTE) If result is NEGATIVE SARS-CoV-2 target nucleic acids are NOT DETECTED. The SARS-CoV-2 RNA is generally detectable in upper and lower  respiratory specimens during the acute phase of infection. The lowest  concentration of SARS-CoV-2 viral copies this assay can detect is 250  copies / mL. A negative result does not preclude SARS-CoV-2 infection  and should not be used as the sole basis for treatment or other  patient management decisions.  A negative result may occur with  improper specimen collection / handling, submission of specimen  other  than nasopharyngeal swab, presence of viral mutation(s) within the  areas targeted by this assay, and inadequate number of viral copies  (<250 copies / mL). A negative result must be combined with clinical  observations, patient history, and epidemiological information. If result is POSITIVE SARS-CoV-2 target nucleic acids are DETECTED. The SARS-CoV-2 RNA is generally detectable in upper and lower  respiratory specimens dur ing the acute phase of infection.  Positive  results are indicative of active infection with SARS-CoV-2.  Clinical  correlation with patient history  and other diagnostic information is  necessary to determine patient infection status.  Positive results do  not rule out bacterial infection or co-infection with other viruses. If result is PRESUMPTIVE POSTIVE SARS-CoV-2 nucleic acids MAY BE PRESENT.   A presumptive positive result was obtained on the submitted specimen  and confirmed on repeat testing.  While 2019 novel coronavirus  (SARS-CoV-2) nucleic acids may be present in the submitted sample  additional confirmatory testing may be necessary for epidemiological  and / or clinical management purposes  to differentiate between  SARS-CoV-2 and other Sarbecovirus currently known to infect humans.  If clinically indicated additional testing with an alternate test  methodology [phone]) is advised. The SARS-CoV-2 RNA is generally  detectable in upper and lower respiratory sp ecimens during the acute  phase of infection. The expected result is Negative. Fact Sheet for Patients:  StrictlyIdeas.no Fact Sheet for Healthcare Providers: BankingDealers.co.za This test is not yet approved or cleared by the [place] FDA and has been authorized for detection and/or diagnosis of SARS-CoV-2 by FDA under an Emergency Use Authorization (EUA).  This EUA will remain in effect (meaning this test can be used) for the duration of the COVID-19 declaration under Section 564(b)(1) of the Act, 21 U.S.C. section 360bbb-3(b)(1), unless the authorization is terminated or revoked sooner. Performed at [facility], [address] [place], [place] [address]     Coagulation Studies: Recent Labs    01/15/19 0754  LABPROT 12.8  INR 1.0    Imaging: Ct Angio Head W Or Wo Contrast  Result Date: 01/15/2019 CLINICAL DATA:  Slurred speech with right arm numbness EXAM: CT ANGIOGRAPHY HEAD AND NECK TECHNIQUE: Multidetector CT imaging of the head and neck was performed using the standard protocol during  bolus administration of intravenous contrast. Multiplanar CT image reconstructions and MIPs were obtained to evaluate the vascular anatomy. Carotid stenosis measurements (when applicable) are obtained utilizing NASCET criteria, using the distal internal carotid diameter as the denominator. CONTRAST:  81mL OMNIPAQUE IOHEXOL 350 MG/ML SOLN COMPARISON:  Noncontrast head CT earlier today FINDINGS: CTA NECK FINDINGS Aortic arch: Mild calcified atherosclerosis. Three vessel branching. Right carotid system: Moderate mainly calcified plaque at the bifurcation without ulceration or flow limiting stenosis. Left carotid system: Moderate calcified plaque at the bifurcation without stenosis or ulceration. Vertebral arteries: No proximal subclavian stenosis. Both vertebral arteries are smooth and widely patent to the dura. 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 Stable mass below the left thyroid gland measuring 25 mm in maximum on axial slices. Upper chest: Negative Review of the MIP images confirms the above findings CTA HEAD FINDINGS Anterior circulation: Calcified plaque on the carotid siphons. No branch occlusion or flow limiting stenosis. Negative for aneurysm or beading. Posterior circulation: Vessels are smooth and diffusely patent. Negative for aneurysm Venous sinuses: Patent Anatomic variants: None significant Review of the MIP images confirms the above  findings IMPRESSION: 1. No emergent finding. 2. Carotid atherosclerosis without flow limiting stenosis or ulceration. 3. 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 No detected growth when compared to 2019 neck CT. Electronically Signed   By: Monte Fantasia M.D.   On: 01/15/2019 08:52   Ct Angio Neck W And/or Wo Contrast  Result Date: 01/15/2019 CLINICAL DATA:  Slurred speech with right arm numbness EXAM: CT ANGIOGRAPHY HEAD AND NECK TECHNIQUE: Multidetector CT  imaging of the head and neck was performed using the standard protocol during bolus administration of intravenous contrast. Multiplanar CT image reconstructions and MIPs were obtained to evaluate the vascular anatomy. Carotid stenosis measurements (when applicable) are obtained utilizing NASCET criteria, using the distal internal carotid diameter as the denominator. CONTRAST:  18mL OMNIPAQUE IOHEXOL 350 MG/ML SOLN COMPARISON:  Noncontrast head CT earlier today FINDINGS: CTA NECK FINDINGS Aortic arch: Mild calcified atherosclerosis. Three vessel branching. Right carotid system: Moderate mainly calcified plaque at the bifurcation without ulceration or flow limiting stenosis. Left carotid system: Moderate calcified plaque at the bifurcation without stenosis or ulceration. Vertebral arteries: No proximal subclavian stenosis. Both vertebral arteries are smooth and widely patent to the dura. 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 Stable mass below the left thyroid gland measuring 25 mm in maximum on axial slices. Upper chest: Negative Review of the MIP images confirms the above findings CTA HEAD FINDINGS Anterior circulation: Calcified plaque on the carotid siphons. No branch occlusion or flow limiting stenosis. Negative for aneurysm or beading. Posterior circulation: Vessels are smooth and diffusely patent. Negative for aneurysm Venous sinuses: Patent Anatomic variants: None significant Review of the MIP images confirms the above findings IMPRESSION: 1. No emergent finding. 2. Carotid atherosclerosis without flow limiting stenosis or ulceration. 3. 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 No detected growth when compared to 2019 neck CT. Electronically Signed   By: Monte Fantasia M.D.   On: 01/15/2019 08:52   Mr Brain Wo Contrast  Result Date: 01/15/2019 CLINICAL DATA:  Stroke, follow-up EXAM: MRI HEAD WITHOUT  CONTRAST TECHNIQUE: Multiplanar, multiecho pulse sequences of the brain and surrounding structures were obtained without intravenous contrast. COMPARISON:  Noncontrast head CT and CT angiogram head/neck 01/15/2019, FINDINGS: Brain: No restricted diffusion is demonstrated to suggest acute or recent subacute infarction. Small chronic lacunar infarcts within the right frontal lobe white matter, right corpus callosum, left caudate nucleus, bilateral thalami, and bilateral cerebellar hemispheres. Additional moderate scattered and confluent T2/FLAIR hyperintensity within the cerebral white matter is nonspecific, but consistent with chronic small vessel ischemic disease. No evidence of intracranial mass. Punctate focus of SWI signal loss within the right temporal occipit al lobe consistent with chronic microhemorrhage. No midline shift or extra-axial collection. Cerebral volume is normal. Vascular: Flow voids maintained within the proximal large vessels. Skull and upper cervical spine: Normal marrow signal. Sinuses/Orbits: The imaged globes and orbits demonstrate no acute abnormality. No significant paranasal sinus disease or mastoid effusion. IMPRESSION: No evidence of acute intracranial abnormality. Specifically, no evidence of acute or recent subacute infarction. Moderate chronic small vessel ischemic disease. 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 Electronically Signed   By: Kellie Simmering   On: 01/15/2019 11:48   Ct Head Code Stroke Wo Contrast  Result Date: 01/15/2019 CLINICAL DATA:  Code stroke.  Slurred speech and right arm numbness EXAM: CT HEAD WITHOUT CONTRAST TECHNIQUE: Contiguous axial images were obtained from the  base of the skull through the vertex without intravenous contrast. COMPARISON:  None. FINDINGS: Brain: Chronic appearing (well-defined and low-density) lacunar infarcts in the left caudate head, bilateral thalamus, and right corpus callosum. Two  small remote left cerebellar infarcts. Mild patchy low-density in the cerebral white matter. No evidence of acute gray matter infarct. No hemorrhage, hydrocephalus, or collection. Vascular: Atherosclerotic calcification.  No hyperdense vessel. Skull: Negative Sinuses/Orbits: Negative These results were called by telephone at the time of interpretation on 01/15/2019 at 7:45 am to Dr. Merlyn Lot , who verbally acknowledged these results. ASPECTS [facility] Stroke Program Early CT Score) - Ganglionic level infarction (caudate, lentiform nuclei, internal capsule, insula, M1-M3 cortex): 7 - Supraganglionic infarction (M4-M6 cortex): 3 Total score (0-10 with 10 being normal): 10 IMPRESSION: 1. No acute finding. 2. 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 Electronically Signed   By: Monte Fantasia M.D.   On: 01/15/2019 07:49    Medications:  I have reviewed the patient's current medications. Scheduled: . amLODipine  10 mg Oral Daily  . aspirin EC  81 mg Oral Daily  . atorvastatin  10 mg Oral q1800  . clopidogrel  75 mg Oral Daily  . cyclobenzaprine  10 mg Oral QHS  . enoxaparin (LOVENOX) injection  40 mg Subcutaneous Q24H  . irbesartan  300 mg Oral Daily    Assessment/Plan: No further neurological complaints.  MRI of the brain reviewed and shows no acute changes.  Some chronic lacunes noted.  A1c 5.8, LDL 81.  EEG is normal.  Echocardiogram is pending.    Recommendations: 1. If echocardiogram shows no cardiac source of emboli patient to start dual antiplatelet therapy with ASA 81mg  and Plavix 75mg  for three weeks with change to ASA 81mg  daily alone as monotherapy after that time.   Otherwise cardiology input recommended.    2. Statin for lipid management with target LDL<70. 3. Patient to follow up with neurology on an outpatient basis    LOS: 1 day   Alexis Goodell, MD Neurology [phone] 01/16/2019  9:56 AM

## 2019-01-17 ENCOUNTER — Telehealth: Payer: Self-pay

## 2019-01-17 LAB — HIV ANTIBODY (ROUTINE TESTING W REFLEX): HIV Screen 4th Generation wRfx: NONREACTIVE

## 2019-01-17 LAB — THYROID PANEL WITH TSH
Free Thyroxine Index: 2 (ref 1.2–4.9)
T3 Uptake Ratio: 31 % (ref 24–39)
T4, Total: 6.3 ug/dL (ref 4.5–12.0)
TSH: 1.84 u[IU]/mL (ref 0.450–4.500)

## 2019-01-17 NOTE — Telephone Encounter (Signed)
Transition Care Management Follow-up Telephone Call  Date of discharge and from where: 01/16/2019 [facility]  How have you been since you were released from the hospital? "doing okay just trying to catch up on rest"  Any questions or concerns? No   Items Reviewed:  Did the pt receive and understand the discharge instructions provided? Yes   Medications obtained and verified? Yes   Any new allergies since your discharge? No   Dietary orders reviewed? Yes  Do you have support at home? Yes   Functional Questionnaire: (I = Independent and D = Dependent) ADLs: i  Bathing/Dressing- i  Meal Prep- i  Eating- i  Maintaining continence- i  Transferring/Ambulation- i  Managing Meds- i  Follow up appointments reviewed:   PCP Hospital f/u appt confirmed? Yes  Scheduled to see Paul Cannon on 01/29/2019 @ 3pm in office.  [facility] f/u appt confirmed? No    Are transportation arrangements needed? No   If their condition worsens, is the pt aware to call PCP or go to the Emergency Dept.? Yes  Was the patient provided with contact information for the PCP's office or ED? Yes  Was to pt encouraged to call back with questions or concerns? Yes

## 2019-01-24 ENCOUNTER — Other Ambulatory Visit: Payer: Self-pay | Admitting: Nurse Practitioner

## 2019-01-24 DIAGNOSIS — I10 Essential (primary) hypertension: Secondary | ICD-10-CM

## 2019-01-24 DIAGNOSIS — E783 Hyperchylomicronemia: Secondary | ICD-10-CM

## 2019-01-28 ENCOUNTER — Inpatient Hospital Stay: Payer: Commercial Managed Care - PPO | Admitting: Family Medicine

## 2019-01-29 ENCOUNTER — Other Ambulatory Visit: Payer: Self-pay

## 2019-01-29 ENCOUNTER — Encounter: Payer: Self-pay | Admitting: Nurse Practitioner

## 2019-01-29 ENCOUNTER — Ambulatory Visit (INDEPENDENT_AMBULATORY_CARE_PROVIDER_SITE_OTHER): Payer: Commercial Managed Care - PPO | Admitting: Nurse Practitioner

## 2019-01-29 VITALS — BP 146/72 | HR 55 | Temp 98.2°F

## 2019-01-29 DIAGNOSIS — I10 Essential (primary) hypertension: Secondary | ICD-10-CM

## 2019-01-29 DIAGNOSIS — E783 Hyperchylomicronemia: Secondary | ICD-10-CM | POA: Diagnosis not present

## 2019-01-29 DIAGNOSIS — I639 Cerebral infarction, unspecified: Secondary | ICD-10-CM

## 2019-01-29 MED ORDER — ATORVASTATIN CALCIUM 20 MG PO TABS: 20.0000 mg | ORAL_TABLET | Freq: Every day | ORAL | 3 refills | Status: DC

## 2019-01-29 NOTE — Assessment & Plan Note (Signed)
Recommend continued focus on health diet choices and regular physical activity (30 minutes 5 days a week). 

## 2019-01-29 NOTE — Progress Notes (Signed)
BP (!) 146/72   Pulse (!) 55   Temp 98.2 F (36.8 C) (Oral)   SpO2 96%    Subjective:    Patient ID: Paul Cannon, male    DOB: 1960-09-02, 58 y.o.   MRN: [id number]  HPI: Paul Cannon is a 58 y.o. male  Chief Complaint  Patient presents with  . Hospitalization Follow-up   Transition of Care Hospital Follow up.   Hospital/Facility: [facility] D/C Physician: Dr. Estanislado Pandy D/C Date: 01/15/2019  Records Requested: 01/29/2019 Records Received: 01/29/2019 Records Reviewed: 01/29/2019  Diagnoses on Discharge: TIA, HTN, HLD, Hypokalemia, OSA  Date of interactive Contact within 48 hours of discharge:  Contact was through: phone  Date of 7 day or 14 day face-to-face visit:    within 14 days  Outpatient Encounter Medications as of 01/29/2019  Medication Sig  . amLODipine (NORVASC) 10 MG tablet TAKE 1 TABLET BY MOUTH ONCE DAILY  . aspirin EC 81 MG EC tablet Take 1 tablet (81 mg total) by mouth daily.  . clopidogrel (PLAVIX) 75 MG tablet Take 1 tablet (75 mg total) by mouth daily for 21 days.  . cyclobenzaprine (FLEXERIL) 10 MG tablet TAKE 1 TABLET BY MOUTH AT BEDTIME (Patient taking differently: Take 10 mg by mouth at bedtime. )  . Multiple Vitamins-Minerals (ONE-A-DAY MENS 50+ ADVANTAGE PO) Take 1 tablet by mouth daily.  Marland Kitchen telmisartan (MICARDIS) 80 MG tablet TAKE 1 TABLET BY MOUTH ONCE DAILY  . [DISCONTINUED] atorvastatin (LIPITOR) 10 MG tablet TAKE 1 TABLET BY MOUTH ONCE DAILY  . atorvastatin (LIPITOR) 20 MG tablet Take 1 tablet (20 mg total) by mouth daily.   No facility-administered encounter medications on file as of 01/29/2019.     Diagnostic Tests Reviewed/Disposition:   CBC Last Labs      Recent Labs  Lab 01/15/19 0754  WBC 4.5  HGB 13.8  HCT 41.4  PLT 189      Chemistries  Last Labs      Recent Labs  Lab 01/15/19 0754  NA 140  K 3.3*  CL 104  CO2 25  GLUCOSE 152*  BUN 16  CREATININE 0.89  CALCIUM 8.7*  AST 39  ALT 54*  ALKPHOS 71   BILITOT 0.7      Cardiac Enzymes Last Labs [] Expand by Default  No results for input(s): TROPONINI in the last 168 hours.    Microbiology Results         Results for orders placed or performed during the hospital encounter of 01/15/19  SARS Coronavirus 2 [facility] order, Performed in [facility] hospital lab) Nasopharyngeal Nasopharyngeal Swab     Status: None   Collection Time: 01/15/19  7:54 AM   Specimen: Nasopharyngeal Swab  Result Value Ref Range Status   SARS Coronavirus 2 NEGATIVE NEGATIVE Final    Comment:      CT HEAD WITHOUT CONTRAST  TECHNIQUE: Contiguous axial images were obtained from the base of the skull through the vertex without intravenous contrast.  COMPARISON:  None.  FINDINGS: Brain: Chronic appearing (well-defined and low-density) lacunar infarcts in the left caudate head, bilateral thalamus, and right corpus callosum. Two small remote left cerebellar infarcts. Mild patchy low-density in the cerebral white matter. No evidence of acute gray matter infarct. No hemorrhage, hydrocephalus, or collection.  Vascular: Atherosclerotic calcification.  No hyperdense vessel.  Skull: Negative  Sinuses/Orbits: Negative  These results were called by telephone at the time of interpretation on 01/15/2019 at 7:45 am to Dr. Merlyn Lot , who verbally acknowledged  these results.  ASPECTS [facility] Stroke Program Early CT Score)  - Ganglionic level infarction (caudate, lentiform nuclei, internal capsule, insula, M1-M3 cortex): 7  - Supraganglionic infarction (M4-M6 cortex): 3  Total score (0-10 with 10 being normal): 10  IMPRESSION: 1. No acute finding. 2. 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   Electronically Signed   By: Monte Fantasia M.D.   On: 01/15/2019 07:49  CT ANGIOGRAPHY HEAD AND NECK  TECHNIQUE: Multidetector CT imaging of the head and neck was performed using the  standard protocol during bolus administration of intravenous contrast. Multiplanar CT image reconstructions and MIPs were obtained to evaluate the vascular anatomy. Carotid stenosis measurements (when applicable) are obtained utilizing NASCET criteria, using the distal internal carotid diameter as the denominator.  CONTRAST:  59mL OMNIPAQUE IOHEXOL 350 MG/ML SOLN  COMPARISON:  Noncontrast head CT earlier today  FINDINGS: CTA NECK FINDINGS  Aortic arch: Mild calcified atherosclerosis. Three vessel branching.  Right carotid system: Moderate mainly calcified plaque at the bifurcation without ulceration or flow limiting stenosis.  Left carotid system: Moderate calcified plaque at the bifurcation without stenosis or ulceration.  Vertebral arteries: No proximal subclavian stenosis. Both vertebral arteries are smooth and widely patent to the dura.  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 Stable mass below the left thyroid gland measuring 25 mm in maximum on axial slices.  Upper chest: Negative  Review of the MIP images confirms the above findings  CTA HEAD FINDINGS  Anterior circulation: Calcified plaque on the carotid siphons. No branch occlusion or flow limiting stenosis. Negative for aneurysm or beading.  Posterior circulation: Vessels are smooth and diffusely patent. Negative for aneurysm  Venous sinuses: Patent  Anatomic variants: None significant  Review of the MIP images confirms the above findings  IMPRESSION: 1. No emergent finding. 2. Carotid atherosclerosis without flow limiting stenosis or ulceration. 3. 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 No detected growth when compared to 2019 neck CT.   Electronically Signed   By: Monte Fantasia M.D.   On: 01/15/2019 08:52  MRI HEAD WITHOUT CONTRAST  TECHNIQUE: Multiplanar,  multiecho pulse sequences of the brain and surrounding structures were obtained without intravenous contrast.  COMPARISON:  Noncontrast head CT and CT angiogram head/neck 01/15/2019,  FINDINGS: Brain: No restricted diffusion is demonstrated to suggest acute or recent subacute infarction. Small chronic lacunar infarcts within the right frontal lobe white matter, right corpus callosum, left caudate nucleus, bilateral thalami, and bilateral cerebellar hemispheres. Additional moderate scattered and confluent T2/FLAIR hyperintensity within the cerebral white matter is nonspecific, but consistent with chronic small vessel ischemic disease. No evidence of intracranial mass. Punctate focus of SWI signal loss within the right temporal occipit al lobe consistent with chronic microhemorrhage. No midline shift or extra-axial collection. Cerebral volume is normal.  Vascular: Flow voids maintained within the proximal large vessels.  Skull and upper cervical spine: Normal marrow signal.  Sinuses/Orbits: The imaged globes and orbits demonstrate no acute abnormality. No significant paranasal sinus disease or mastoid effusion.  IMPRESSION: No evidence of acute intracranial abnormality. Specifically, no evidence of acute or recent subacute infarction.  Moderate chronic small vessel ischemic disease. 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   Electronically Signed   By: Kellie Simmering   On: 01/15/2019 11:48  Consults: Cardiology and Hospitalist team  Discharge Instructions: Follow- up with PCP, cardiology, and neurology  Disease/illness Education: Educated at visit today  Home Health/Community Services Discussions/Referrals: none  Establishment or re-establishment of referral orders for community resources: none  Discussion with other health care providers: reviewed notes  Assessment and Support of treatment regimen adherence:  reviewed medications and goals of care  Appointments Coordinated with: patient  Education for self-management, independent living, and ADLs: reviewed  ADMITTING HISTORY 58 year old male with past medical history of hypertension, hyperlipidemia, morbid obesity, and sleep apnea on CPAP presenting to the ED with right facial droop and slurred speech. Patient and patient's wife report that alarm company called at around0500this morning and woke him up, per wife patient was "normal"and at baseline. He went back to bed and woke up at around 6:30am "not feeling right". Patient states he went to the bathroom and noticed that his right hand was shaking and difficulty with coordination. He tried to use the toothbrush but noticed that he was having some difficulty. Patient states he proceeded to take a bath and while showering he was making "unusual gibberish sound". Patient states when he got out ofthe shower he felt a warm rash all over his body associated with nausea without vomiting. He went to tell his wife that he was leaving but he noticed that he was having difficulty getting his words out. Patient's wife also noticed that he was slurring his speech with right facial droop they therefore decided to come to the ED for further evaluation. On arrival to the ED, he was afebrile with blood pressure150/62mm Hg and pulse rate 53beats/min. He was noted to have right facial droop otherwise no other focal neurological deficit; he was alert and oriented x4, and he did not demonstrate any memory deficits.Initial NIH stroke scale 1. Code stroke was initiated and patient evaluated by a tele-neurologist. Noncontrast CT head was obtained and showed no acute intracranial abnormality. Follow-up CTA head and neck showed no large vessel occlusion. Per tele-neurologist clinical presentation was not suggestive of large vessel occlusive disease therefore patient was not a candidate for thrombectomy. He was  also not deemed candidate for tPA thrombolytics because of resolved symptoms with no residual disabling symptoms.Patient will be admitted under hospitalist service for further stroke work up.  HOSPITAL COURSE:  Patient admitted to medical floor.. Patient was worked up with CT head, MRI brain and CT angiogram of the neck.  MRI brain revealed multiple old lacunar infarcts, no acute abnormalities.  No acute findings found on the CTA head and neck. patient was worked up with echocardiogram showed EF of 50 to 55% and no shunt noted on bubble study.  Patient was seen by neurology.  Recommended aspirin and Plavix orally.  Plavix to continue for 3 weeks and aspirin indefinitely.  Patient will continue medications for hyperlipidemia and there is statin and blood pressure medications.  He will follow-up with neurology in the clinic.  Patient also had bradycardia with first-degree AV block.  EKGs were done and case was discussed with [facility] clinic cardiology Dr Nehemiah Massed ,who recommended outpatient follow-up and no acute intervention.  HYPERTENSION / HYPERLIPIDEMIA Currently taking Telmisartan and Amlodipine for HTN + Atorvastatin 10 MG for HLD. States he is seeing Dr. Nehemiah Massed tomorrow with cardiology to review medications and BP.  On visit today he denies any further episodes, such as one that recently brought in into hospital.  Reports he has gone back to focusing on diet and regular exercise at home, with the support of his wife.  Has lost 5 pounds. Satisfied with current treatment? yes Duration of  hypertension: chronic BP monitoring frequency: daily BP range: 135/69 to 153/77 (appears to trending down since recent discharge) BP medication side effects: no Duration of hyperlipidemia: chronic Cholesterol medication side effects: no Cholesterol supplements: none Medication compliance: good compliance Aspirin: yes & Clopidogrel (was told to stay on Plavix for 3 weeks) Recent stressors: no Recurrent  headaches: no Visual changes: no Palpitations: no Dyspnea: no Chest pain: no Lower extremity edema: no Dizzy/lightheaded: no   Relevant past medical, surgical, family and social history reviewed and updated as indicated. Interim medical history since our last visit reviewed. Allergies and medications reviewed and updated.  Review of Systems  Constitutional: Negative for activity change, diaphoresis, fatigue and fever.  Respiratory: Negative for cough, chest tightness, shortness of breath and wheezing.   Cardiovascular: Negative for chest pain, palpitations and leg swelling.  Gastrointestinal: Negative for abdominal distention, abdominal pain, constipation, diarrhea, nausea and vomiting.  Endocrine: Negative for cold intolerance, heat intolerance, polydipsia, polyphagia and polyuria.  Neurological: Negative for dizziness, syncope, weakness, light-headedness, numbness and headaches.  Psychiatric/Behavioral: Negative.     Per HPI unless specifically indicated above     Objective:    BP (!) 146/72   Pulse (!) 55   Temp 98.2 F (36.8 C) (Oral)   SpO2 96%   Wt Readings from Last 3 Encounters:  01/15/19 (!) 323 lb 6.6 oz (146.7 kg)  06/29/18 (!) 328 lb 3.2 oz (148.9 kg)  02/19/18 (!) 320 lb (145.2 kg)    Physical Exam Vitals signs and nursing note reviewed.  Constitutional:      General: He is awake. He is not in acute distress.    Appearance: He is well-developed. He is not ill-appearing.  HENT:     Head: Normocephalic and atraumatic.     Right Ear: Hearing normal. No drainage.     Left Ear: Hearing normal. No drainage.  Eyes:     General: Lids are normal.        Right eye: No discharge.        Left eye: No discharge.     Extraocular Movements: Extraocular movements intact.     Conjunctiva/sclera: Conjunctivae normal.     Pupils: Pupils are equal, round, and reactive to light.     Visual Fields: Right eye visual fields normal and left eye visual fields normal.  Neck:      Musculoskeletal: Normal range of motion and neck supple.     Thyroid: No thyromegaly.     Vascular: No carotid bruit.  Cardiovascular:     Rate and Rhythm: Normal rate and regular rhythm.     Heart sounds: Normal heart sounds, S1 normal and S2 normal. No murmur. No gallop.   Pulmonary:     Effort: Pulmonary effort is normal. No accessory muscle usage or respiratory distress.     Breath sounds: Normal breath sounds.  Abdominal:     General: Bowel sounds are normal.     Palpations: Abdomen is soft.  Musculoskeletal: Normal range of motion.     Right lower leg: Edema (trace) present.     Left lower leg: Edema (trace) present.  Skin:    General: Skin is warm and dry.  Neurological:     Mental Status: He is alert and oriented to person, place, and time.     Cranial Nerves: Cranial nerves are intact.     Gait: Gait is intact.     Deep Tendon Reflexes:     Reflex Scores:      Brachioradialis reflexes  are 1+ on the right side and 1+ on the left side.      Patellar reflexes are 1+ on the right side and 1+ on the left side. Psychiatric:        Attention and Perception: Attention normal.        Mood and Affect: Mood normal.        Speech: Speech normal.        Behavior: Behavior normal. Behavior is cooperative.        Thought Content: Thought content normal.        Judgment: Judgment normal.     Results for orders placed or performed during the hospital encounter of 01/15/19  SARS Coronavirus 2 [facility] order, Performed in [facility] hospital lab) Nasopharyngeal Nasopharyngeal Swab   Specimen: Nasopharyngeal Swab  Result Value Ref Range   SARS Coronavirus 2 NEGATIVE NEGATIVE  Glucose, capillary  Result Value Ref Range   Glucose-Capillary 142 (H) 70 - 99 mg/dL  Ethanol  Result Value Ref Range   Alcohol, Ethyl (B) <10 <10 mg/dL  Protime-INR  Result Value Ref Range   Prothrombin Time 12.8 11.4 - 15.2 seconds   INR 1.0 0.8 - 1.2  APTT  Result Value Ref Range   aPTT 30 24 -  36 seconds  CBC  Result Value Ref Range   WBC 4.5 4.0 - 10.5 K/uL   RBC 4.82 4.22 - 5.81 MIL/uL   Hemoglobin 13.8 13.0 - 17.0 g/dL   HCT 41.4 39.0 - 52.0 %   MCV 85.9 80.0 - 100.0 fL   MCH 28.6 26.0 - 34.0 pg   MCHC 33.3 30.0 - 36.0 g/dL   RDW 13.9 11.5 - 15.5 %   Platelets 189 150 - 400 K/uL   nRBC 0.0 0.0 - 0.2 %  Differential  Result Value Ref Range   Neutrophils Relative % 59 %   Neutro Abs 2.6 1.7 - 7.7 K/uL   Lymphocytes Relative 29 %   Lymphs Abs 1.3 0.7 - 4.0 K/uL   Monocytes Relative 9 %   Monocytes Absolute 0.4 0.1 - 1.0 K/uL   Eosinophils Relative 3 %   Eosinophils Absolute 0.1 0.0 - 0.5 K/uL   Basophils Relative 0 %   Basophils Absolute 0.0 0.0 - 0.1 K/uL   Immature Granulocytes 0 %   Abs Immature Granulocytes 0.01 0.00 - 0.07 K/uL  Comprehensive metabolic panel  Result Value Ref Range   Sodium 140 135 - 145 mmol/L   Potassium 3.3 (L) 3.5 - 5.1 mmol/L   Chloride 104 98 - 111 mmol/L   CO2 25 22 - 32 mmol/L   Glucose, Bld 152 (H) 70 - 99 mg/dL   BUN 16 6 - 20 mg/dL   Creatinine, Ser 0.89 0.61 - 1.24 mg/dL   Calcium 8.7 (L) 8.9 - 10.3 mg/dL   Total Protein 7.3 6.5 - 8.1 g/dL   Albumin 3.9 3.5 - 5.0 g/dL   AST 39 15 - 41 U/L   ALT 54 (H) 0 - 44 U/L   Alkaline Phosphatase 71 38 - 126 U/L   Total Bilirubin 0.7 0.3 - 1.2 mg/dL   GFR calc non Af Amer >60 >60 mL/min   GFR calc Af Amer >60 >60 mL/min   Anion gap 11 5 - 15  Urine Drug Screen, Qualitative  Result Value Ref Range   Tricyclic, Ur Screen NONE DETECTED NONE DETECTED   Amphetamines, Ur Screen NONE DETECTED NONE DETECTED   MDMA (Ecstasy)Ur Screen NONE DETECTED NONE DETECTED  Cocaine Metabolite,Ur [place] NONE DETECTED NONE DETECTED   Opiate, Ur Screen NONE DETECTED NONE DETECTED   Phencyclidine (PCP) Ur S NONE DETECTED NONE DETECTED   Cannabinoid 50 Ng, Ur  NONE DETECTED NONE DETECTED   Barbiturates, Ur Screen NONE DETECTED NONE DETECTED   Benzodiazepine, Ur Scrn NONE DETECTED NONE DETECTED    Methadone Scn, Ur NONE DETECTED NONE DETECTED  Urinalysis, Routine w reflex microscopic  Result Value Ref Range   Color, Urine STRAW (A) YELLOW   APPearance CLEAR (A) CLEAR   Specific Gravity, Urine 1.019 1.005 - 1.030   pH 7.0 5.0 - 8.0   Glucose, UA NEGATIVE NEGATIVE mg/dL   Hgb urine dipstick NEGATIVE NEGATIVE   Bilirubin Urine NEGATIVE NEGATIVE   Ketones, ur NEGATIVE NEGATIVE mg/dL   Protein, ur NEGATIVE NEGATIVE mg/dL   Nitrite NEGATIVE NEGATIVE   Leukocytes,Ua NEGATIVE NEGATIVE  Hemoglobin A1c  Result Value Ref Range   Hgb A1c MFr Bld 5.8 (H) 4.8 - 5.6 %   Mean Plasma Glucose 119.76 mg/dL  Lipid panel  Result Value Ref Range   Cholesterol 145 0 - 200 mg/dL   Triglycerides 132 <150 mg/dL   HDL 38 (L) >40 mg/dL   Total CHOL/HDL Ratio 3.8 RATIO   VLDL 26 0 - 40 mg/dL   LDL Cholesterol 81 0 - 99 mg/dL  HIV Antibody (routine testing w rflx)  Result Value Ref Range   HIV Screen 4th Generation wRfx Non Reactive Non Reactive  Thyroid Panel With TSH  Result Value Ref Range   TSH 1.840 0.450 - 4.500 uIU/mL   T4, Total 6.3 4.5 - 12.0 ug/dL   T3 Uptake Ratio 31 24 - 39 %   Free Thyroxine Index 2.0 1.2 - 4.9      Assessment & Plan:   Problem List Items Addressed This Visit      Cardiovascular and Mediastinum   Hypertension    Chronic, ongoing with BP slightly above goal for stroke prevention.  Sees cardiology tomorrow, will defer changes until visit with them for further review.  He has poorly tolerate HCTZ and BB in past.  Continue current medication regimen and obtain CMP today.  Return in 4 weeks      Relevant Medications   atorvastatin (LIPITOR) 20 MG tablet   Other Relevant Orders   Comprehensive metabolic panel   Stroke (cerebrum) (HCC) - Primary    New diagnosis, no further TIA episodes.  Continue current medication regimen and collaboration with cardiology and neurology.  CMP today.      Relevant Medications   atorvastatin (LIPITOR) 20 MG tablet     Other    Hyperlipidemia    Chronic, ongoing.  Will increase Atorvastatin to 20 MG QHS for tighter control and stroke prevention.  Return in 4 weeks for labs and visit.      Relevant Medications   atorvastatin (LIPITOR) 20 MG tablet   Morbid obesity ([place])    Recommend continued focus on health diet choices and regular physical activity (30 minutes 5 days a week).          Follow up plan: Return in about 4 weeks (around 02/26/2019) for HLD/HTN.

## 2019-01-29 NOTE — Patient Instructions (Signed)

## 2019-01-29 NOTE — Assessment & Plan Note (Signed)
New diagnosis, no further TIA episodes.  Continue current medication regimen and collaboration with cardiology and neurology.  CMP today.

## 2019-01-29 NOTE — Assessment & Plan Note (Signed)
Chronic, ongoing.  Will increase Atorvastatin to 20 MG QHS for tighter control and stroke prevention.  Return in 4 weeks for labs and visit.

## 2019-01-29 NOTE — Assessment & Plan Note (Addendum)
Chronic, ongoing with BP slightly above goal for stroke prevention.  Sees cardiology tomorrow, will defer changes until visit with them for further review.  He has poorly tolerate HCTZ and BB in past.  Continue current medication regimen and obtain CMP today.  Return in 4 weeks

## 2019-01-30 LAB — COMPREHENSIVE METABOLIC PANEL
ALT: 49 IU/L — ABNORMAL HIGH (ref 0–44)
AST: 33 IU/L (ref 0–40)
Albumin/Globulin Ratio: 1.7 (ref 1.2–2.2)
Albumin: 4.6 g/dL (ref 3.8–4.9)
Alkaline Phosphatase: 84 IU/L (ref 39–117)
BUN/Creatinine Ratio: 19 (ref 9–20)
BUN: 18 mg/dL (ref 6–24)
Bilirubin Total: 0.4 mg/dL (ref 0.0–1.2)
CO2: 24 mmol/L (ref 20–29)
Calcium: 9.6 mg/dL (ref 8.7–10.2)
Chloride: 102 mmol/L (ref 96–106)
Creatinine, Ser: 0.96 mg/dL (ref 0.76–1.27)
GFR calc Af Amer: 100 mL/min/{1.73_m2} (ref >59)
GFR calc non Af Amer: 87 mL/min/{1.73_m2} (ref >59)
Globulin, Total: 2.7 g/dL (ref 1.5–4.5)
Glucose: 96 mg/dL (ref 65–99)
Potassium: 4.1 mmol/L (ref 3.5–5.2)
Sodium: 140 mmol/L (ref 134–144)
Total Protein: 7.3 g/dL (ref 6.0–8.5)

## 2019-01-31 ENCOUNTER — Other Ambulatory Visit: Payer: Self-pay | Admitting: Internal Medicine

## 2019-01-31 DIAGNOSIS — E782 Mixed hyperlipidemia: Secondary | ICD-10-CM

## 2019-01-31 DIAGNOSIS — R001 Bradycardia, unspecified: Secondary | ICD-10-CM

## 2019-01-31 DIAGNOSIS — I6381 Other cerebral infarction due to occlusion or stenosis of small artery: Secondary | ICD-10-CM

## 2019-01-31 DIAGNOSIS — I10 Essential (primary) hypertension: Secondary | ICD-10-CM

## 2019-02-06 ENCOUNTER — Ambulatory Visit: Payer: Commercial Managed Care - PPO

## 2019-02-07 ENCOUNTER — Ambulatory Visit: Payer: Commercial Managed Care - PPO

## 2019-02-13 ENCOUNTER — Other Ambulatory Visit: Payer: Self-pay

## 2019-02-13 ENCOUNTER — Ambulatory Visit
Admission: RE | Admit: 2019-02-13 | Discharge: 2019-02-13 | Disposition: A | Discharge status: Home / Self Care | Payer: Commercial Managed Care - PPO | Source: Ambulatory Visit | Attending: Internal Medicine | Admitting: Internal Medicine

## 2019-02-13 DIAGNOSIS — I6381 Other cerebral infarction due to occlusion or stenosis of small artery: Secondary | ICD-10-CM | POA: Diagnosis present

## 2019-02-13 DIAGNOSIS — R001 Bradycardia, unspecified: Secondary | ICD-10-CM | POA: Insufficient documentation

## 2019-02-13 DIAGNOSIS — I10 Essential (primary) hypertension: Secondary | ICD-10-CM | POA: Insufficient documentation

## 2019-02-13 DIAGNOSIS — E782 Mixed hyperlipidemia: Secondary | ICD-10-CM | POA: Insufficient documentation

## 2019-02-13 LAB — NM MYOCAR MULTI W/SPECT W/WALL MOTION / EF
Estimated workload: 1 METS
Exercise duration (min): 1 min
Exercise duration (sec): 5 s
LV dias vol: 187 mL (ref 62–150)
LV sys vol: 64 mL
Peak HR: 62 {beats}/min
Rest HR: 51 {beats}/min
SDS: 3
SRS: 0
SSS: 0
TID: 0.98

## 2019-02-13 MED ORDER — REGADENOSON 0.4 MG/5ML IV SOLN
0.4000 mg | Freq: Once | INTRAVENOUS | Status: AC
  Administered 2019-02-13: 0.4 mg via INTRAVENOUS

## 2019-02-13 MED ORDER — TECHNETIUM TC 99M TETROFOSMIN IV KIT
10.9500 | PACK | Freq: Once | INTRAVENOUS | Status: AC | PRN
  Administered 2019-02-13: 10.95 via INTRAVENOUS

## 2019-02-13 MED ORDER — TECHNETIUM TC 99M TETROFOSMIN IV KIT
30.0000 | PACK | Freq: Once | INTRAVENOUS | Status: AC | PRN
  Administered 2019-02-13: 30.168 via INTRAVENOUS

## 2019-02-22 ENCOUNTER — Other Ambulatory Visit: Payer: Self-pay

## 2019-02-22 ENCOUNTER — Ambulatory Visit (INDEPENDENT_AMBULATORY_CARE_PROVIDER_SITE_OTHER): Payer: Commercial Managed Care - PPO | Admitting: Nurse Practitioner

## 2019-02-22 ENCOUNTER — Encounter: Payer: Self-pay | Admitting: Nurse Practitioner

## 2019-02-22 DIAGNOSIS — E783 Hyperchylomicronemia: Secondary | ICD-10-CM | POA: Diagnosis not present

## 2019-02-22 DIAGNOSIS — R7303 Prediabetes: Secondary | ICD-10-CM | POA: Diagnosis not present

## 2019-02-22 DIAGNOSIS — I10 Essential (primary) hypertension: Secondary | ICD-10-CM

## 2019-02-22 NOTE — Assessment & Plan Note (Signed)
Chronic, ongoing with improvement on home and office BP noted.  Continue current medication regimen and recommend DASH diet at home, lowering sodium in diet to avoid fluid retention.  Continue to collaborate with cardiology.  Return to office in 3 months.

## 2019-02-22 NOTE — Assessment & Plan Note (Signed)
Recommend continued focus on health diet choices and regular physical activity (30 minutes 5 days a week).  Praised for weight loss.

## 2019-02-22 NOTE — Assessment & Plan Note (Signed)
Continue focus on diet, has lost 18 pounds.  Praised for loss.  A1C in 3 months.

## 2019-02-22 NOTE — Assessment & Plan Note (Signed)
Chronic, ongoing.  Continue current medication regimen.  Will plan on repeat lipid panel and CMP in 3 months.

## 2019-02-22 NOTE — Patient Instructions (Signed)

## 2019-02-22 NOTE — Progress Notes (Signed)
BP 131/75   Pulse (!) 55   Temp 98.2 F (36.8 C) (Oral)   Wt (!) 310 lb 12.8 oz (141 kg)   SpO2 97%   BMI 42.15 kg/m    Subjective:    Patient ID: Paul Cannon, male    DOB: January 04, 1961, 58 y.o.   MRN: [id number]  HPI: [name] is a 58 y.o. male  Chief Complaint  Patient presents with  . Hyperlipidemia  . Hypertension   HYPERTENSION / HYPERLIPIDEMIA Followed by cardiology and neurology with last visits 01/30/19 and 02/05/19.  History of CVA, lacunar infarcts.  Had recent MYO screen, normal with EF 55-65%. Continues on Micardis, Amlodipine, ASA, Lipitor.  Has underlying sleep apnea, he continues to use CPAP nightly.  Goes to [employer] for supplies and downloads. Satisfied with current treatment? yes Duration of hypertension: chronic BP monitoring frequency: daily BP range: 124/67 to 154/67 --- average 130/70 range on review of his documentation, checking every morning with HR 50-60's BP medication side effects: no Duration of hyperlipidemia: chronic Cholesterol medication side effects: no Cholesterol supplements: none Medication compliance: good compliance Aspirin: yes Recent stressors: no Recurrent headaches: no Visual changes: no Palpitations: no Dyspnea: no Chest pain: no Lower extremity edema: no Dizzy/lightheaded: no   PREDIABETES: A1C recently 5.8% in August.  States he is cutting out a lot of sugars and carbs.  Has lost 18 pounds since the beginning of year. Polydipsia/polyuria: no Visual disturbance: no Chest pain: no Paresthesias: no  Relevant past medical, surgical, family and social history reviewed and updated as indicated. Interim medical history since our last visit reviewed. Allergies and medications reviewed and updated.  Review of Systems  Constitutional: Negative for activity change, diaphoresis, fatigue and fever.  Respiratory: Negative for cough, chest tightness, shortness of breath and wheezing.   Cardiovascular: Negative  for chest pain, palpitations and leg swelling.  Gastrointestinal: Negative for abdominal distention, abdominal pain, constipation, diarrhea, nausea and vomiting.  Endocrine: Negative for cold intolerance, heat intolerance, polydipsia, polyphagia and polyuria.  Neurological: Negative for dizziness, syncope, weakness, light-headedness, numbness and headaches.  Psychiatric/Behavioral: Negative.     Per HPI unless specifically indicated above     Objective:    BP 131/75   Pulse (!) 55   Temp 98.2 F (36.8 C) (Oral)   Wt (!) 310 lb 12.8 oz (141 kg)   SpO2 97%   BMI 42.15 kg/m   Wt Readings from Last 3 Encounters:  02/22/19 (!) 310 lb 12.8 oz (141 kg)  01/15/19 (!) 323 lb 6.6 oz (146.7 kg)  06/29/18 (!) 328 lb 3.2 oz (148.9 kg)    Physical Exam Vitals signs and nursing note reviewed.  Constitutional:      General: He is awake. He is not in acute distress.    Appearance: He is well-developed. He is obese. He is not ill-appearing.  HENT:     Head: Normocephalic and atraumatic.     Right Ear: Hearing normal. No drainage.     Left Ear: Hearing normal. No drainage.  Eyes:     General: Lids are normal.        Right eye: No discharge.        Left eye: No discharge.     Conjunctiva/sclera: Conjunctivae normal.     Pupils: Pupils are equal, round, and reactive to light.  Neck:     Musculoskeletal: Normal range of motion and neck supple.     Thyroid: No thyromegaly.     Vascular: No carotid bruit.  Cardiovascular:     Rate and Rhythm: Normal rate and regular rhythm.     Heart sounds: Normal heart sounds, S1 normal and S2 normal. No murmur. No gallop.   Pulmonary:     Effort: Pulmonary effort is normal. No accessory muscle usage or respiratory distress.     Breath sounds: Normal breath sounds.  Abdominal:     General: Bowel sounds are normal.     Palpations: Abdomen is soft.  Musculoskeletal: Normal range of motion.     Right lower leg: 1+ Edema present.     Left lower leg: 1+  Edema present.  Lymphadenopathy:     Cervical: No cervical adenopathy.  Skin:    General: Skin is warm and dry.  Neurological:     Mental Status: He is alert and oriented to person, place, and time.  Psychiatric:        Mood and Affect: Mood normal.        Behavior: Behavior normal. Behavior is cooperative.        Thought Content: Thought content normal.        Judgment: Judgment normal.     Results for orders placed or performed during the hospital encounter of 02/13/19  NM Myocar Multi W/Spect W/Wall Motion / EF  Result Value Ref Range   Rest HR 51 bpm   Rest BP 151/73 mmHg   Exercise duration (sec) 5 sec   Exercise duration (min) 1 min   Estimated workload 1.0 METS   Peak HR 62 bpm   Peak BP 128/64 mmHg   SSS 0    SRS 0    SDS 3    TID 0.98    LV sys vol 64 mL   LV dias vol 187 62 - 150 mL      Assessment & Plan:   Problem List Items Addressed This Visit      Cardiovascular and Mediastinum   Hypertension    Chronic, ongoing with improvement on home and office BP noted.  Continue current medication regimen and recommend DASH diet at home, lowering sodium in diet to avoid fluid retention.  Continue to collaborate with cardiology.  Return to office in 3 months.      Relevant Medications   aspirin (ASPIRIN 81) 81 MG chewable tablet     Other   Hyperlipidemia    Chronic, ongoing.  Continue current medication regimen.  Will plan on repeat lipid panel and CMP in 3 months.      Relevant Medications   aspirin (ASPIRIN 81) 81 MG chewable tablet   Morbid obesity (HCC) - Primary    Recommend continued focus on health diet choices and regular physical activity (30 minutes 5 days a week).  Praised for weight loss.      Prediabetes    Continue focus on diet, has lost 18 pounds.  Praised for loss.  A1C in 3 months.          Follow up plan: Return in about 3 months (around 05/24/2019) for Prediabetes, HTN/HLD.

## 2019-02-26 ENCOUNTER — Ambulatory Visit: Payer: Commercial Managed Care - PPO | Admitting: Nurse Practitioner

## 2019-03-18 ENCOUNTER — Other Ambulatory Visit: Payer: Self-pay | Admitting: Nurse Practitioner

## 2019-03-18 MED ORDER — ROSUVASTATIN CALCIUM 20 MG PO TABS: 20.0000 mg | ORAL_TABLET | Freq: Every day | ORAL | 3 refills | Status: DC

## 2019-03-18 NOTE — Progress Notes (Signed)
Change from Lipitor to Crestor due to side effects, hip pain.

## 2019-04-16 ENCOUNTER — Other Ambulatory Visit: Payer: Self-pay | Admitting: Nurse Practitioner

## 2019-04-16 DIAGNOSIS — I10 Essential (primary) hypertension: Secondary | ICD-10-CM

## 2019-07-03 ENCOUNTER — Ambulatory Visit (INDEPENDENT_AMBULATORY_CARE_PROVIDER_SITE_OTHER): Payer: Commercial Managed Care - PPO | Admitting: Nurse Practitioner

## 2019-07-03 ENCOUNTER — Encounter: Payer: Self-pay | Admitting: Nurse Practitioner

## 2019-07-03 DIAGNOSIS — E783 Hyperchylomicronemia: Secondary | ICD-10-CM | POA: Diagnosis not present

## 2019-07-03 DIAGNOSIS — R7303 Prediabetes: Secondary | ICD-10-CM

## 2019-07-03 DIAGNOSIS — Z6839 Body mass index (BMI) 39.0-39.9, adult: Secondary | ICD-10-CM

## 2019-07-03 DIAGNOSIS — I10 Essential (primary) hypertension: Secondary | ICD-10-CM

## 2019-07-03 NOTE — Assessment & Plan Note (Addendum)
Chronic, ongoing.  Continue current medication regimen and adjust as needed.  Will plan on repeat lipid panel and CMP outpatient.  Have discussed with him if ongoing muscle aches could consider Crestor 3 times a week only.  Educated him on stroke prevention goals: BP <130/90 LDL <70 A1C <6.5

## 2019-07-03 NOTE — Patient Instructions (Signed)
Carbohydrate Counting for Diabetes Mellitus, Adult  Carbohydrate counting is a method of keeping track of how many carbohydrates you eat. Eating carbohydrates naturally increases the amount of sugar (glucose) in the blood. Counting how many carbohydrates you eat helps keep your blood glucose within normal limits, which helps you manage your diabetes (diabetes mellitus). It is important to know how many carbohydrates you can safely have in each meal. This is different for every person. A diet and nutrition specialist (registered dietitian) can help you make a meal plan and calculate how many carbohydrates you should have at each meal and snack. Carbohydrates are found in the following foods:  Grains, such as breads and cereals.  Dried beans and soy products.  Starchy vegetables, such as potatoes, peas, and corn.  Fruit and fruit juices.  Milk and yogurt.  Sweets and snack foods, such as cake, cookies, candy, chips, and soft drinks. How do I count carbohydrates? There are two ways to count carbohydrates in food. You can use either of the methods or a combination of both. Reading "Nutrition Facts" on packaged food The "Nutrition Facts" list is included on the labels of almost all packaged foods and beverages in the [place] It includes:  The serving size.  Information about nutrients in each serving, including the grams (g) of carbohydrate per serving. To use the "Nutrition Facts":  Decide how many servings you will have.  Multiply the number of servings by the number of carbohydrates per serving.  The resulting number is the total amount of carbohydrates that you will be having. Learning standard serving sizes of other foods When you eat carbohydrate foods that are not packaged or do not include "Nutrition Facts" on the label, you need to measure the servings in order to count the amount of carbohydrates:  Measure the foods that you will eat with a food scale or measuring cup, if  needed.  Decide how many standard-size servings you will eat.  Multiply the number of servings by 15. Most carbohydrate-rich foods have about 15 g of carbohydrates per serving. ? For example, if you eat 8 oz (170 g) of strawberries, you will have eaten 2 servings and 30 g of carbohydrates (2 servings x 15 g = 30 g).  For foods that have more than one food mixed, such as soups and casseroles, you must count the carbohydrates in each food that is included. The following list contains standard serving sizes of common carbohydrate-rich foods. Each of these servings has about 15 g of carbohydrates:   hamburger bun or  English muffin.   oz (15 mL) syrup.   oz (14 g) jelly.  1 slice of bread.  1 six-inch tortilla.  3 oz (85 g) cooked rice or pasta.  4 oz (113 g) cooked dried beans.  4 oz (113 g) starchy vegetable, such as peas, corn, or potatoes.  4 oz (113 g) hot cereal.  4 oz (113 g) mashed potatoes or  of a large baked potato.  4 oz (113 g) canned or frozen fruit.  4 oz (120 mL) fruit juice.  4-6 crackers.  6 chicken nuggets.  6 oz (170 g) unsweetened dry cereal.  6 oz (170 g) plain fat-free yogurt or yogurt sweetened with artificial sweeteners.  8 oz (240 mL) milk.  8 oz (170 g) fresh fruit or one small piece of fruit.  24 oz (680 g) popped popcorn. Example of carbohydrate counting Sample meal  3 oz (85 g) chicken breast.  6 oz (170 g)   brown rice.  4 oz (113 g) corn.  8 oz (240 mL) milk.  8 oz (170 g) strawberries with sugar-free whipped topping. Carbohydrate calculation 1. Identify the foods that contain carbohydrates: ? Rice. ? Corn. ? Milk. ? Strawberries. 2. Calculate how many servings you have of each food: ? 2 servings rice. ? 1 serving corn. ? 1 serving milk. ? 1 serving strawberries. 3. Multiply each number of servings by 15 g: ? 2 servings rice x 15 g = 30 g. ? 1 serving corn x 15 g = 15 g. ? 1 serving milk x 15 g = 15 g. ? 1  serving strawberries x 15 g = 15 g. 4. Add together all of the amounts to find the total grams of carbohydrates eaten: ? 30 g + 15 g + 15 g + 15 g = 75 g of carbohydrates total. Summary  Carbohydrate counting is a method of keeping track of how many carbohydrates you eat.  Eating carbohydrates naturally increases the amount of sugar (glucose) in the blood.  Counting how many carbohydrates you eat helps keep your blood glucose within normal limits, which helps you manage your diabetes.  A diet and nutrition specialist (registered dietitian) can help you make a meal plan and calculate how many carbohydrates you should have at each meal and snack. This information is not intended to replace advice given to you by your health care provider. Make sure you discuss any questions you have with your health care provider. Document Revised: 12/15/2016 Document Reviewed: 11/04/2015 Elsevier Patient Education  2020 Elsevier Inc.  

## 2019-07-03 NOTE — Progress Notes (Signed)
BP 133/72   Pulse (!) 57   Wt 290 lb (131.5 kg)   BMI 39.33 kg/m    Subjective:    Patient ID: Paul Cannon, male    DOB: August 25, 1960, 59 y.o.   MRN: [id number]  HPI: Paul Cannon is a 59 y.o. male  Chief Complaint  Patient presents with  . Hyperlipidemia  . Hypertension    . This visit was completed via FaceTime due to the restrictions of the COVID-19 pandemic. All issues as above were discussed and addressed. Physical exam was done as above through visual confirmation on Facetime. If it was felt that the patient should be evaluated in the office, they were directed there. The patient verbally consented to this visit. . Location of the patient: home . Location of the provider: home . Those involved with this call:  . Provider: Marnee Guarneri, DNP . CMA: Yvonna Alanis, CMA . Front Desk/Registration: Don Perking  . Time spent on call: 15 minutes on the phone discussing health concerns. 10 minutes total spent in review of patient's record and preparation of their chart.  . I verified patient identity using two factors (patient name and date of birth). Patient consents verbally to being seen via telemedicine visit today.    HYPERTENSION / HYPERLIPIDEMIA Followed by cardiology and neurology with last visit with cardiology 06/09/2019. History of CVA, lacunar infarcts.  Had recent MYO screen, normal with EF 55-65%. Continues on Chlorthalidone, Micardis, Amlodipine, ASA, Lipitor.  Has underlying sleep apnea, he continues to use CPAP nightly.  Goes to [employer] for supplies and downloads.  Does endorse some muscle aches with statin, but does not wish to change anything at this time. Satisfied with current treatment? yes Duration of hypertension: chronic BP monitoring frequency: daily BP range: 107/68 to 140/80 --- average 130/70 range on review of his documentation, checking every morning with HR 50-60's BP medication side effects: no Duration of hyperlipidemia:  chronic Cholesterol medication side effects: no Cholesterol supplements: none Medication compliance: good compliance Aspirin: yes Recent stressors: no Recurrent headaches: no Visual changes: no Palpitations: no Dyspnea: no Chest pain: no Lower extremity edema: no Dizzy/lightheaded: no   PREDIABETES: A1C recently 5.8% in August.  States he is cutting out a lot of sugars and carbs. Has lost 18 pounds since the beginning of year.  Has cut back on [employer] cakes.   Polydipsia/polyuria: no Visual disturbance: no Chest pain: no Paresthesias: no  Relevant past medical, surgical, family and social history reviewed and updated as indicated. Interim medical history since our last visit reviewed. Allergies and medications reviewed and updated.  Review of Systems  Constitutional: Negative for activity change, diaphoresis, fatigue and fever.  Respiratory: Negative for cough, chest tightness, shortness of breath and wheezing.   Cardiovascular: Negative for chest pain, palpitations and leg swelling.  Gastrointestinal: Negative.   Endocrine: Negative for cold intolerance, heat intolerance, polydipsia, polyphagia and polyuria.  Neurological: Negative.   Psychiatric/Behavioral: Negative.     Per HPI unless specifically indicated above     Objective:    BP 133/72   Pulse (!) 57   Wt 290 lb (131.5 kg)   BMI 39.33 kg/m   Wt Readings from Last 3 Encounters:  07/03/19 290 lb (131.5 kg)  02/22/19 (!) 310 lb 12.8 oz (141 kg)  01/15/19 (!) 323 lb 6.6 oz (146.7 kg)    Physical Exam Vitals and nursing note reviewed.  Constitutional:      General: He is awake. He is not in acute  distress.    Appearance: He is well-developed. He is not ill-appearing.  HENT:     Head: Normocephalic.     Right Ear: Hearing normal. No drainage.     Left Ear: Hearing normal. No drainage.  Eyes:     General: Lids are normal.        Right eye: No discharge.        Left eye: No discharge.      Conjunctiva/sclera: Conjunctivae normal.  Pulmonary:     Effort: Pulmonary effort is normal. No accessory muscle usage or respiratory distress.  Musculoskeletal:     Cervical back: Normal range of motion.  Neurological:     Mental Status: He is alert and oriented to person, place, and time.  Psychiatric:        Mood and Affect: Mood normal.        Behavior: Behavior normal. Behavior is cooperative.        Thought Content: Thought content normal.        Judgment: Judgment normal.     Results for orders placed or performed during the hospital encounter of 02/13/19  NM Myocar Multi W/Spect W/Wall Motion / EF  Result Value Ref Range   Rest HR 51 bpm   Rest BP 151/73 mmHg   Exercise duration (sec) 5 sec   Exercise duration (min) 1 min   Estimated workload 1.0 METS   Peak HR 62 bpm   Peak BP 128/64 mmHg   SSS 0    SRS 0    SDS 3    TID 0.98    LV sys vol 64 mL   LV dias vol 187 62 - 150 mL      Assessment & Plan:   Problem List Items Addressed This Visit      Cardiovascular and Mediastinum   Hypertension    Chronic, stable with home BP at goal.  Continue current medication regimen and recommend DASH diet at home, lowering sodium in diet to avoid fluid retention.  Continue to collaborate with cardiology.  Return to office in 6 months.  Will obtain outpatient labs.      Relevant Medications   chlorthalidone (HYGROTON) 25 MG tablet     Other   Hyperlipidemia    Chronic, ongoing.  Continue current medication regimen and adjust as needed.  Will plan on repeat lipid panel and CMP outpatient.  Have discussed with him if ongoing muscle aches could consider Crestor 3 times a week only.  Educated him on stroke prevention goals: BP <130/90 LDL <70 A1C <6.5      Relevant Medications   chlorthalidone (HYGROTON) 25 MG tablet   Morbid obesity ([place]) - Primary    Recommend continued focus on healthy diet choices and regular physical activity (30 minutes 5 days a week).        Prediabetes    Continue focus on diet, has lost moderate amount of weight.  Praised for loss.  A1C outpatient, if remains stable or improved will check next in 6 months.         I discussed the assessment and treatment plan with the patient. The patient was provided an opportunity to ask questions and all were answered. The patient agreed with the plan and demonstrated an understanding of the instructions.   The patient was advised to call back or seek an in-person evaluation if the symptoms worsen or if the condition fails to improve as anticipated.   I provided 15 minutes of time during this encounter.  Follow up plan: No follow-ups on file.

## 2019-07-03 NOTE — Assessment & Plan Note (Addendum)
Chronic, stable with home BP at goal.  Continue current medication regimen and recommend DASH diet at home, lowering sodium in diet to avoid fluid retention.  Continue to collaborate with cardiology.  Return to office in 6 months.  Will obtain outpatient labs.

## 2019-07-03 NOTE — Assessment & Plan Note (Signed)
Recommend continued focus on healthy diet choices and regular physical activity (30 minutes 5 days a week).  

## 2019-07-03 NOTE — Assessment & Plan Note (Signed)
Continue focus on diet, has lost moderate amount of weight.  Praised for loss.  A1C outpatient, if remains stable or improved will check next in 6 months.

## 2019-07-05 ENCOUNTER — Other Ambulatory Visit: Payer: Self-pay | Admitting: Nurse Practitioner

## 2019-07-05 DIAGNOSIS — I10 Essential (primary) hypertension: Secondary | ICD-10-CM

## 2019-07-06 ENCOUNTER — Other Ambulatory Visit: Payer: Self-pay | Admitting: Nurse Practitioner

## 2019-07-06 DIAGNOSIS — I10 Essential (primary) hypertension: Secondary | ICD-10-CM

## 2019-07-08 ENCOUNTER — Other Ambulatory Visit: Payer: Self-pay

## 2019-07-08 ENCOUNTER — Other Ambulatory Visit: Payer: Commercial Managed Care - PPO

## 2019-07-08 DIAGNOSIS — I10 Essential (primary) hypertension: Secondary | ICD-10-CM

## 2019-07-08 DIAGNOSIS — E783 Hyperchylomicronemia: Secondary | ICD-10-CM

## 2019-07-08 DIAGNOSIS — R7303 Prediabetes: Secondary | ICD-10-CM

## 2019-07-08 LAB — LIPID PANEL PICCOLO, WAIVED
Chol/HDL Ratio Piccolo,Waive: 2.4 mg/dL
Cholesterol Piccolo, Waived: 100 mg/dL (ref <200)
HDL Chol Piccolo, Waived: 42 mg/dL — ABNORMAL LOW (ref >59)
LDL Chol Calc Piccolo Waived: 38 mg/dL (ref <100)
Triglycerides Piccolo,Waived: 100 mg/dL (ref <150)
VLDL Chol Calc Piccolo,Waive: 20 mg/dL (ref <30)

## 2019-07-08 LAB — BAYER DCA HB A1C WAIVED: HB A1C (BAYER DCA - WAIVED): 5.6 % (ref <7.0)

## 2019-07-08 NOTE — Progress Notes (Signed)
Contacted via MyChart

## 2019-07-09 LAB — COMPREHENSIVE METABOLIC PANEL
ALT: 17 IU/L (ref 0–44)
AST: 18 IU/L (ref 0–40)
Albumin/Globulin Ratio: 1.7 (ref 1.2–2.2)
Albumin: 4.6 g/dL (ref 3.8–4.9)
Alkaline Phosphatase: 79 IU/L (ref 39–117)
BUN/Creatinine Ratio: 19 (ref 9–20)
BUN: 20 mg/dL (ref 6–24)
Bilirubin Total: 0.3 mg/dL (ref 0.0–1.2)
CO2: 25 mmol/L (ref 20–29)
Calcium: 9 mg/dL (ref 8.7–10.2)
Chloride: 101 mmol/L (ref 96–106)
Creatinine, Ser: 1.08 mg/dL (ref 0.76–1.27)
GFR calc Af Amer: 87 mL/min/{1.73_m2} (ref >59)
GFR calc non Af Amer: 75 mL/min/{1.73_m2} (ref >59)
Globulin, Total: 2.7 g/dL (ref 1.5–4.5)
Glucose: 101 mg/dL — ABNORMAL HIGH (ref 65–99)
Potassium: 3.8 mmol/L (ref 3.5–5.2)
Sodium: 141 mmol/L (ref 134–144)
Total Protein: 7.3 g/dL (ref 6.0–8.5)

## 2019-07-09 NOTE — Progress Notes (Signed)
Contacted via MyChart

## 2019-07-22 ENCOUNTER — Encounter: Payer: Self-pay | Admitting: Nurse Practitioner

## 2019-08-16 ENCOUNTER — Encounter: Payer: Self-pay | Admitting: Nurse Practitioner

## 2019-09-02 ENCOUNTER — Other Ambulatory Visit: Payer: Self-pay | Admitting: Nurse Practitioner

## 2019-09-02 MED ORDER — CYCLOBENZAPRINE HCL 10 MG PO TABS: 10.0000 mg | ORAL_TABLET | Freq: Every day | ORAL | 5 refills | Status: DC

## 2019-09-02 NOTE — Telephone Encounter (Signed)
Requested medication (s) are due for refill today: yes  Requested medication (s) are on the active medication list:yes  Last refill:  08/29/18  Future visit scheduled:yes  Notes to clinic:  Medication not delegated    Requested Prescriptions  Pending Prescriptions Disp Refills   cyclobenzaprine (FLEXERIL) 10 MG tablet 30 tablet 5    Sig: Take 1 tablet (10 mg total) by mouth at bedtime.      Not Delegated - Analgesics:  Muscle Relaxants Failed - 09/02/2019  4:18 PM      Failed - This refill cannot be delegated      Passed - Valid encounter within last 6 months    Recent Outpatient Visits           2 months ago Morbid obesity ([place])   [place], Henrine Screws T, NP   6 months ago Morbid obesity ([place])   [place], Jolene T, NP   7 months ago Cerebrovascular accident (CVA), unspecified mechanism ([place])   [place], Jolene T, NP   8 months ago Morbid obesity ([place])   [place], Barbaraann Faster, NP   1 year ago Essential hypertension   [place], Barbaraann Faster, NP       Future Appointments             In 4 months Cann[place], Barbaraann Faster, NP [facility], [facility]

## 2019-09-02 NOTE — Telephone Encounter (Signed)
LOV 07/03/19

## 2019-09-02 NOTE — Telephone Encounter (Signed)
Copied from [place] [phone]. Topic: Quick Communication - Rx Refill/Question >> Sep 02, 2019  4:14 PM Leward Quan A wrote: Medication: cyclobenzaprine (FLEXERIL) 10 MG tablet  Has the patient contacted their pharmacy? Yes.   (Agent: If no, request that the patient contact the pharmacy for the refill.) (Agent: If yes, when and what did the pharmacy advise?)  Preferred Pharmacy (with phone number or street name): [facility] [place] - [id number]  Phone:  [phone] Fax:  [phone]     Agent: Please be advised that RX refills may take up to 3 business days. We ask that you follow-up with your pharmacy.

## 2019-12-24 ENCOUNTER — Other Ambulatory Visit: Payer: Self-pay | Admitting: Nurse Practitioner

## 2019-12-24 DIAGNOSIS — I10 Essential (primary) hypertension: Secondary | ICD-10-CM

## 2019-12-24 NOTE — Telephone Encounter (Signed)
Requested Prescriptions  Pending Prescriptions Disp Refills  . amLODipine (NORVASC) 10 MG tablet [Pharmacy Med Name: AMLODIPINE BESYLATE 10 MG TAB] 90 tablet 0    Sig: TAKE 1 TABLET BY MOUTH ONCE DAILY     Cardiovascular:  Calcium Channel Blockers Passed - 12/24/2019  2:16 PM      Passed - Last BP in normal range    BP Readings from Last 1 Encounters:  07/03/19 133/72         Passed - Valid encounter within last 6 months    Recent Outpatient Visits          5 months ago Morbid obesity ([place])   [place], Jolene T, NP   10 months ago Morbid obesity ([place])   [place], Jolene T, NP   10 months ago Cerebrovascular accident (CVA), unspecified mechanism ([place])   [place], Jolene T, NP   11 months ago Morbid obesity ([place])   [place], [place] T, NP   1 year ago Essential hypertension   [place], [place] T, NP      Future Appointments            In 3 weeks Cannady, Barbaraann Faster, NP [facility]           . telmisartan (MICARDIS) 80 MG tablet [Pharmacy Med Name: TELMISARTAN 80 MG TAB] 90 tablet 0    Sig: TAKE 1 TABLET BY MOUTH ONCE DAILY     Cardiovascular:  Angiotensin Receptor Blockers Passed - 12/24/2019  2:16 PM      Passed - Cr in normal range and within 180 days    Creatinine, Ser  Date Value Ref Range Status  07/08/2019 1.08 0.76 - 1.27 mg/dL Final         Passed - K in normal range and within 180 days    Potassium  Date Value Ref Range Status  07/08/2019 3.8 3.5 - 5.2 mmol/L Final         Passed - Patient is not pregnant      Passed - Last BP in normal range    BP Readings from Last 1 Encounters:  07/03/19 133/72         Passed - Valid encounter within last 6 months    Recent Outpatient Visits          5 months ago Morbid obesity ([place])   [place], Jolene T, NP   10 months ago Morbid obesity ([place])   [place], Jolene T, NP   10 months ago Cerebrovascular accident (CVA), unspecified mechanism ([place])   [place], Jolene T, NP   11 months ago Morbid obesity ([place])   [place], [place] T, NP   1 year ago Essential hypertension   [place], Barbaraann Faster, NP      Future Appointments            In 3 weeks Cannady, Barbaraann Faster, NP [facility], [facility]

## 2020-01-06 ENCOUNTER — Encounter: Payer: Self-pay | Admitting: Nurse Practitioner

## 2020-01-17 ENCOUNTER — Encounter: Payer: Self-pay | Admitting: Nurse Practitioner

## 2020-01-17 ENCOUNTER — Ambulatory Visit (INDEPENDENT_AMBULATORY_CARE_PROVIDER_SITE_OTHER): Payer: Commercial Managed Care - PPO | Admitting: Nurse Practitioner

## 2020-01-17 ENCOUNTER — Other Ambulatory Visit: Payer: Self-pay

## 2020-01-17 VITALS — BP 109/67 | HR 53 | Temp 98.2°F | Ht 71.0 in | Wt 299.4 lb

## 2020-01-17 DIAGNOSIS — I10 Essential (primary) hypertension: Secondary | ICD-10-CM

## 2020-01-17 DIAGNOSIS — R7301 Impaired fasting glucose: Secondary | ICD-10-CM

## 2020-01-17 DIAGNOSIS — Z125 Encounter for screening for malignant neoplasm of prostate: Secondary | ICD-10-CM | POA: Diagnosis not present

## 2020-01-17 DIAGNOSIS — Z0000 Encounter for general adult medical examination without abnormal findings: Secondary | ICD-10-CM | POA: Diagnosis not present

## 2020-01-17 DIAGNOSIS — Z8673 Personal history of transient ischemic attack (TIA), and cerebral infarction without residual deficits: Secondary | ICD-10-CM

## 2020-01-17 DIAGNOSIS — E783 Hyperchylomicronemia: Secondary | ICD-10-CM

## 2020-01-17 DIAGNOSIS — G4733 Obstructive sleep apnea (adult) (pediatric): Secondary | ICD-10-CM

## 2020-01-17 DIAGNOSIS — M25571 Pain in right ankle and joints of right foot: Secondary | ICD-10-CM

## 2020-01-17 LAB — MICROALBUMIN, URINE WAIVED
Creatinine, Urine Waived: 100 mg/dL (ref 10–300)
Microalb, Ur Waived: 10 mg/L (ref 0–19)
Microalb/Creat Ratio: <30 mg/g (ref <30)

## 2020-01-17 MED ORDER — ROSUVASTATIN CALCIUM 20 MG PO TABS: 20.0000 mg | ORAL_TABLET | ORAL | 4 refills | Status: DC

## 2020-01-17 MED ORDER — AMLODIPINE BESYLATE 10 MG PO TABS: 10.0000 mg | ORAL_TABLET | Freq: Every day | ORAL | 4 refills | Status: DC

## 2020-01-17 MED ORDER — CHLORTHALIDONE 25 MG PO TABS: 25.0000 mg | ORAL_TABLET | Freq: Every day | ORAL | 4 refills | Status: DC

## 2020-01-17 MED ORDER — TELMISARTAN 80 MG PO TABS: 80.0000 mg | ORAL_TABLET | Freq: Every day | ORAL | 4 refills | Status: DC

## 2020-01-17 NOTE — Assessment & Plan Note (Addendum)
Chronic, stable with home BP at goal in office and at home.  Continue current medication regimen and recommend DASH diet at home, lowering sodium in diet to avoid fluid retention.  Adjust regimen as needed.  Continue to collaborate with cardiology as needed.  Return to office in 6 months.  Labs today CMP & TSH.  Recommend minimize Ibuprofen use.

## 2020-01-17 NOTE — Assessment & Plan Note (Signed)
No further TIA episodes.  Continue current medication regimen and collaboration with cardiology and neurology as needed.  CMP today. 

## 2020-01-17 NOTE — Assessment & Plan Note (Signed)
BMI 41.76 today, is working on losing recent weight he gained while traveling.  Recommended eating smaller high protein, low fat meals more frequently and exercising 30 mins a day 5 times a week with a goal of 10-15lb weight loss in the next 3 months. Patient voiced their understanding and motivation to adhere to these recommendations.

## 2020-01-17 NOTE — Progress Notes (Signed)
BP 109/67   Pulse (!) 53   Temp 98.2 F (36.8 C) (Oral)   Ht 5\' 11"  (1.803 m)   Wt 299 lb 6.4 oz (135.8 kg)   SpO2 95%   BMI 41.76 kg/m    Subjective:    Patient ID: Paul Cannon, male    DOB: 1961/02/19, 59 y.o.   MRN: [id number]  HPI: Paul Cannon is a 59 y.o. male presenting on 01/17/2020 for comprehensive medical examination. Current medical complaints include:none  He currently lives with: Interim Problems from his last visit: no   HYPERTENSION / HYPERLIPIDEMIA Followed by cardiology and neurology with last visit with cardiology 10/17/2019 (no changes) and neurology 08/06/19 (no changes) -- both reported he can return as needed. History of CVA, lacunar infarcts. Had past MYO screen, normal with EF 55-65%. Continues on Chlorthalidone, Micardis, Amlodipine, ASA, Lipitor. Has underlying sleep apnea, he continues to use CPAP nightly. Goes to [employer] for supplies and downloads.  Satisfied with current treatment?yes Duration of hypertension:chronic BP monitoring frequency:daily BP range:115-140/70-80 range, checking every morning with HR 50-60's BP medication side effects:no Duration of hyperlipidemia:chronic Cholesterol medication side effects:no Cholesterol supplements: none Medication compliance:good compliance Aspirin:yes Recent stressors:no Recurrent headaches:no Visual changes:no Palpitations:no Dyspnea:no Chest pain:no Lower extremity edema:no Dizzy/lightheaded:no  PREDIABETES: A1C recently 5.6% in February, down from 5.8%. Has had some weight loss, however gained 9 pounds from previous visit. Polydipsia/polyuria:no Visual disturbance:no Chest pain:no Paresthesias:no  ANKLE PAIN To right ankle -- started when went to [facility] one week ago (9 hour drive).  Ankle was sore after 9 hours of driving there, then over the next 4 days it became worse and he had to walk with a cane.  Last Tuesday they returned and his wife drove most  of the way -- but he took over in [place].  Last Thursday and Friday walked with cane at work, then over weekend did not need to work with cane any further.  Reports it is stiff.  No recent falls or trauma. Duration: weeks Involved ankle: right Mechanism of injury: unknown Location: right ankle -- anterior and anterior foot Onset: sudden  Severity: 1/10 at present -- with movement 2/10 Quality:  dull and aching Frequency: intermittent Radiation: no Aggravating factors: weight bearing, walking and movement  Alleviating factors: Gabapentin (his wife's, which did help) -- Flexeril at night and NSAIDs  Status: better -- continues to be slightly swollen Treatments attempted: Gabapentin (his wife's, which did help) -- Flexeril at night and NSAIDs  Relief with NSAIDs?:  moderate Weakness with weight bearing or walking: no Morning stiffness: yes Swelling: yes Redness: no Bruising: no Paresthesias / decreased sensation: no  Fevers:no  Functional Status Survey: Is the patient deaf or have difficulty hearing?: No Does the patient have difficulty seeing, even when wearing glasses/contacts?: No Does the patient have difficulty concentrating, remembering, or making decisions?: No Does the patient have difficulty walking or climbing stairs?: No Does the patient have difficulty dressing or bathing?: No Does the patient have difficulty doing errands alone such as visiting a doctor's office or shopping?: No  FALL RISK: Fall Risk  01/17/2020 01/25/2018 07/24/2017 10/13/2015  Falls in the past year? 0 No No No  Number falls in past yr: 0 - - -  Injury with Fall? 0 - - -  Risk for fall due to : No Fall Risks - - -  Follow up Falls evaluation completed - - -    Depression Screen Depression screen [facility] 2/9 01/17/2020 07/03/2019 01/25/2018 07/24/2017 10/13/2015  Decreased Interest 0 0 0 0 0  Down, Depressed, Hopeless 0 0 0 0 0  PHQ - 2 Score 0 0 0 0 0  Altered sleeping - - 0 0 -  Tired, decreased energy  - - 0 0 -  Change in appetite - - 0 0 -  Feeling bad or failure about yourself  - - 0 0 -  Trouble concentrating - - 0 0 -  Moving slowly or fidgety/restless - - 0 0 -  Suicidal thoughts - - 0 0 -  PHQ-9 Score - - 0 0 -    Advanced Directives <no information>  Past Medical History:  Past Medical History:  Diagnosis Date  . Hyperlipidemia   . Hypertension   . Obesity   . Sleep apnea     Surgical History:  Past Surgical History:  Procedure Laterality Date  . COLONOSCOPY WITH PROPOFOL N/A 11/17/2015   Procedure: COLONOSCOPY WITH PROPOFOL;  Surgeon: [name], MD;  Location: [facility] ENDOSCOPY;  Service: Endoscopy;  Laterality: N/A;  . SHOULDER SURGERY      Medications:  Current Outpatient Medications on File Prior to Visit  Medication Sig  . aspirin (ASPIRIN 81) 81 MG chewable tablet Chew 81 mg by mouth daily.  . cyclobenzaprine (FLEXERIL) 10 MG tablet Take 1 tablet (10 mg total) by mouth at bedtime.  . Multiple Vitamins-Minerals (ONE-A-DAY MENS 50+ ADVANTAGE PO) Take 1 tablet by mouth daily.   No current facility-administered medications on file prior to visit.    Allergies:  Allergies  Allergen Reactions  . Percocet [Oxycodone-Acetaminophen] Diarrhea and Nausea And Vomiting  . Vicodin [Hydrocodone-Acetaminophen] Diarrhea and Nausea And Vomiting    Social History:  Social History   Socioeconomic History  . Marital status: Married    Spouse name: Not on file  . Number of children: Not on file  . Years of education: Not on file  . Highest education level: Not on file  Occupational History  . Not on file  Tobacco Use  . Smoking status: Former Smoker    Quit date: 10/01/2010    Years since quitting: 9.3  . Smokeless tobacco: Former Network engineer  . Vaping Use: Never used  Substance and Sexual Activity  . Alcohol use: Yes    Comment: pt states occasional or very rarely  . Drug use: No  . Sexual activity: Yes  Other Topics Concern  . Not on file  Social  History Narrative  . Not on file   Social Determinants of Health   Financial Resource Strain:   . Difficulty of Paying Living Expenses:   Food Insecurity:   . Worried About Charity fundraiser in the Last Year:   . Arboriculturist in the Last Year:   Transportation Needs:   . Film/video editor (Medical):   Marland Kitchen Lack of Transportation (Non-Medical):   Physical Activity:   . Days of Exercise per Week:   . Minutes of Exercise per Session:   Stress:   . Feeling of Stress :   Social Connections:   . Frequency of Communication with Friends and Family:   . Frequency of Social Gatherings with Friends and Family:   . Attends Religious Services:   . Active Member of Clubs or Organizations:   . Attends Archivist Meetings:   Marland Kitchen Marital Status:   Intimate Partner Violence:   . Fear of Current or Ex-Partner:   . Emotionally Abused:   Marland Kitchen Physically Abused:   .  Sexually Abused:    Social History   Tobacco Use  Smoking Status Former Smoker  . Quit date: 10/01/2010  . Years since quitting: 9.3  Smokeless Tobacco Former Systems developer   Social History   Substance and Sexual Activity  Alcohol Use Yes   Comment: pt states occasional or very rarely    Family History:  Family History  Problem Relation Age of Onset  . Arthritis Mother   . Hyperlipidemia Mother   . Hypertension Mother   . Migraines Mother   . Heart disease Father        MI  . Stroke Father   . Lung disease Father   . Cancer Sister        breast  . Cancer Brother        melanoma  . Stroke Sister     Past medical history, surgical history, medications, allergies, family history and social history reviewed with patient today and changes made to appropriate areas of the chart.   Review of Systems -  negative All other ROS negative except what is listed above and in the HPI.      Objective:    BP 109/67   Pulse (!) 53   Temp 98.2 F (36.8 C) (Oral)   Ht 5\' 11"  (1.803 m)   Wt 299 lb 6.4 oz (135.8 kg)    SpO2 95%   BMI 41.76 kg/m   Wt Readings from Last 3 Encounters:  01/17/20 299 lb 6.4 oz (135.8 kg)  07/03/19 290 lb (131.5 kg)  02/22/19 (!) 310 lb 12.8 oz (141 kg)    Physical Exam Vitals and nursing note reviewed.  Constitutional:      General: He is awake. He is not in acute distress.    Appearance: He is well-developed and well-groomed. He is morbidly obese. He is not ill-appearing.  HENT:     Head: Normocephalic and atraumatic.     Right Ear: Hearing, tympanic membrane, ear canal and external ear normal. No drainage.     Left Ear: Hearing, tympanic membrane, ear canal and external ear normal. No drainage.     Nose: Nose normal.     Mouth/Throat:     Pharynx: Uvula midline.  Eyes:     General: Lids are normal.        Right eye: No discharge.        Left eye: No discharge.     Extraocular Movements: Extraocular movements intact.     Conjunctiva/sclera: Conjunctivae normal.     Pupils: Pupils are equal, round, and reactive to light.     Visual Fields: Right eye visual fields normal and left eye visual fields normal.  Neck:     Thyroid: No thyromegaly.     Vascular: No carotid bruit or JVD.     Trachea: Trachea normal.  Cardiovascular:     Rate and Rhythm: Normal rate and regular rhythm.     Heart sounds: Normal heart sounds, S1 normal and S2 normal. No murmur heard.  No gallop.   Pulmonary:     Effort: Pulmonary effort is normal. No accessory muscle usage or respiratory distress.     Breath sounds: Normal breath sounds.  Abdominal:     General: Bowel sounds are normal.     Palpations: Abdomen is soft. There is no hepatomegaly or splenomegaly.     Tenderness: There is no abdominal tenderness.  Musculoskeletal:        General: Normal range of motion.     Cervical back:  Normal range of motion and neck supple.     Right lower leg: 1+ Edema present.     Left lower leg: 1+ Edema present.     Right ankle: Swelling (very mild to anterior aspect) present. No deformity or  lacerations. Tenderness (to anterior) present. Normal range of motion. Anterior drawer test negative. Normal pulse.     Left ankle: Normal.  Lymphadenopathy:     Head:     Right side of head: No submental, submandibular, tonsillar, preauricular or posterior auricular adenopathy.     Left side of head: No submental, submandibular, tonsillar, preauricular or posterior auricular adenopathy.     Cervical: No cervical adenopathy.  Skin:    General: Skin is warm and dry.     Capillary Refill: Capillary refill takes less than 2 seconds.     Findings: No rash.  Neurological:     Mental Status: He is alert and oriented to person, place, and time.     Cranial Nerves: Cranial nerves are intact.     Gait: Gait is intact.     Deep Tendon Reflexes: Reflexes are normal and symmetric.     Reflex Scores:      Brachioradialis reflexes are 2+ on the right side and 2+ on the left side.      Patellar reflexes are 2+ on the right side and 2+ on the left side. Psychiatric:        Attention and Perception: Attention normal.        Mood and Affect: Mood normal.        Speech: Speech normal.        Behavior: Behavior normal. Behavior is cooperative.        Thought Content: Thought content normal.        Cognition and Memory: Cognition normal.        Judgment: Judgment normal.    Results for orders placed or performed in visit on 07/08/19  Lipid Panel Piccolo, [employer] [facility]  Result Value Ref Range   Cholesterol Piccolo, Waived 100 <200 mg/dL   HDL Chol Piccolo, Waived 42 (L) >59 mg/dL   Triglycerides Piccolo,Waived 100 <150 mg/dL   Chol/HDL Ratio Piccolo,Waive 2.4 mg/dL   LDL Chol Calc Piccolo Waived 38 <100 mg/dL   VLDL Chol Calc Piccolo,Waive 20 <30 mg/dL  Comprehensive metabolic panel  Result Value Ref Range   Glucose 101 (H) 65 - 99 mg/dL   BUN 20 6 - 24 mg/dL   Creatinine, Ser 1.08 0.76 - 1.27 mg/dL   GFR calc non Af Amer 75 >59 mL/min/1.73   GFR calc Af Amer 87 >59 mL/min/1.73   BUN/Creatinine Ratio  19 9 - 20   Sodium 141 134 - 144 mmol/L   Potassium 3.8 3.5 - 5.2 mmol/L   Chloride 101 96 - 106 mmol/L   CO2 25 20 - 29 mmol/L   Calcium 9.0 8.7 - 10.2 mg/dL   Total Protein 7.3 6.0 - 8.5 g/dL   Albumin 4.6 3.8 - 4.9 g/dL   Globulin, Total 2.7 1.5 - 4.5 g/dL   Albumin/Globulin Ratio 1.7 1.2 - 2.2   Bilirubin Total 0.3 0.0 - 1.2 mg/dL   Alkaline Phosphatase 79 39 - 117 IU/L   AST 18 0 - 40 IU/L   ALT 17 0 - 44 IU/L  Bayer DCA Hb A1c Waived  Result Value Ref Range   HB A1C (BAYER DCA - WAIVED) 5.6 <7.0 %      Assessment & Plan:   Problem List Items  Addressed This Visit      Cardiovascular and Mediastinum   Hypertension    Chronic, stable with home BP at goal in office and at home.  Continue current medication regimen and recommend DASH diet at home, lowering sodium in diet to avoid fluid retention.  Adjust regimen as needed.  Continue to collaborate with cardiology as needed.  Return to office in 6 months.  Labs today CMP & TSH.  Recommend minimize Ibuprofen use.      Relevant Medications   amLODipine (NORVASC) 10 MG tablet   telmisartan (MICARDIS) 80 MG tablet   rosuvastatin (CRESTOR) 20 MG tablet (Start on 01/20/2020)   chlorthalidone (HYGROTON) 25 MG tablet   Other Relevant Orders   TSH     Respiratory   Sleep apnea    Chronic, stable -- continue 100% use of CPAP at home.        Endocrine   IFG (impaired fasting glucose)    Improved last check, obtain A1C today and urine ALB.      Relevant Orders   HgB A1c   Microalbumin, Urine Waived     Other   Hyperlipidemia    Chronic, ongoing.  Continue current medication regimen and adjust as needed -- is on twice weekly Crestor due to myalgia with increased doses.  Will plan on repeat lipid panel and CMP today.  Refills sent in.  Educated him on stroke prevention goals: BP <130/90 LDL <70 A1C <6.5      Relevant Medications   amLODipine (NORVASC) 10 MG tablet   telmisartan (MICARDIS) 80 MG tablet   rosuvastatin  (CRESTOR) 20 MG tablet (Start on 01/20/2020)   chlorthalidone (HYGROTON) 25 MG tablet   Other Relevant Orders   Comprehensive metabolic panel   Lipid Panel w/o Chol/HDL Ratio   Morbid obesity ([place])    BMI 41.76 today, is working on losing recent weight he gained while traveling.  Recommended eating smaller high protein, low fat meals more frequently and exercising 30 mins a day 5 times a week with a goal of 10-15lb weight loss in the next 3 months. Patient voiced their understanding and motivation to adhere to these recommendations.       History of stroke    No further TIA episodes.  Continue current medication regimen and collaboration with cardiology and neurology as needed.  CMP today.      Acute right ankle pain    Acute and improving, will obtain uric acid level today.  No trauma, will defer imaging at this time.  Arthritis vs gout? Recommend use of Tylenol vs Ibuprofen at home due to HTN, may use Tylenol as needed with max daily dose total of 3000 MG.  Recommend ice and Diclofenac gel as needed + ankle compression support.  Review labs once returned.  Return to office for worsening or ongoing.      Relevant Orders   Uric acid    Other Visit Diagnoses    Routine general medical examination at a health care facility    -  Primary   Relevant Orders   CBC with Differential/Platelet   PSA   Prostate cancer screening       PSA on labs today   Relevant Orders   PSA       Discussed aspirin prophylaxis for myocardial infarction prevention and decision was made to continue ASA  LABORATORY TESTING:  Health maintenance labs ordered today as discussed above.   The natural history of prostate cancer and ongoing controversy regarding screening  and potential treatment outcomes of prostate cancer has been discussed with the patient. The meaning of a false positive PSA and a false negative PSA has been discussed. He indicates understanding of the limitations of this screening test and  wishes  to proceed with screening PSA testing.   IMMUNIZATIONS:   - Tdap: Tetanus vaccination status reviewed: last tetanus booster within 10 years. - Influenza: Up to date - Pneumovax: Not applicable - Prevnar: Not applicable - Zostavax vaccine: Refused  SCREENING: - Colonoscopy: Up to date  Discussed with patient purpose of the colonoscopy is to detect colon cancer at curable precancerous or early stages   - AAA Screening: Not applicable  -Hearing Test: Not applicable  -Spirometry: Not applicable   PATIENT COUNSELING:    Sexuality: Discussed sexually transmitted diseases, partner selection, use of condoms, avoidance of unintended pregnancy  and contraceptive alternatives.   Advised to avoid cigarette smoking.  I discussed with the patient that most people either abstain from alcohol or drink within safe limits (<=14/week and <=4 drinks/occasion for males, <=7/weeks and <= 3 drinks/occasion for females) and that the risk for alcohol disorders and other health effects rises proportionally with the number of drinks per week and how often a drinker exceeds daily limits.  Discussed cessation/primary prevention of drug use and availability of treatment for abuse.   Diet: Encouraged to adjust caloric intake to maintain  or achieve ideal body weight, to reduce intake of dietary saturated fat and total fat, to limit sodium intake by avoiding high sodium foods and not adding table salt, and to maintain adequate dietary potassium and calcium preferably from fresh fruits, vegetables, and low-fat dairy products.    Stressed the importance of regular exercise  Injury prevention: Discussed safety belts, safety helmets, smoke detector, smoking near bedding or upholstery.   Dental health: Discussed importance of regular tooth brushing, flossing, and dental visits.   Follow up plan: NEXT PREVENTATIVE PHYSICAL DUE IN 1 YEAR. Return in about 6 months (around 07/19/2020) for HTN/HLD, IFG.

## 2020-01-17 NOTE — Assessment & Plan Note (Signed)
Acute and improving, will obtain uric acid level today.  No trauma, will defer imaging at this time.  Arthritis vs gout? Recommend use of Tylenol vs Ibuprofen at home due to HTN, may use Tylenol as needed with max daily dose total of 3000 MG.  Recommend ice and Diclofenac gel as needed + ankle compression support.  Review labs once returned.  Return to office for worsening or ongoing.

## 2020-01-17 NOTE — Patient Instructions (Addendum)
Low-Purine Eating Plan A low-purine eating plan involves making food choices to limit your intake of purine. Purine is a kind of uric acid. Too much uric acid in your blood can cause certain conditions, such as gout and kidney stones. Eating a low-purine diet can help control these conditions. What are tips for following this plan? Reading food labels   Avoid foods with saturated or Trans fat.  Check the ingredient list of grains-based foods, such as bread and cereal, to make sure that they contain whole grains.  Check the ingredient list of sauces or soups to make sure they do not contain meat or fish.  When choosing soft drinks, check the ingredient list to make sure they do not contain high-fructose corn syrup. Shopping  Buy plenty of fresh fruits and vegetables.  Avoid buying canned or fresh fish.  Buy dairy products labeled as low-fat or nonfat.  Avoid buying premade or processed foods. These foods are often high in fat, salt (sodium), and added sugar. Cooking  Use olive oil instead of butter when cooking. Oils like olive oil, canola oil, and sunflower oil contain healthy fats. Meal planning  Learn which foods do or do not affect you. If you find out that a food tends to cause your gout symptoms to flare up, avoid eating that food. You can enjoy foods that do not cause problems. If you have any questions about a food item, talk with your dietitian or health care provider.  Limit foods high in fat, especially saturated fat. Fat makes it harder for your body to get rid of uric acid.  Choose foods that are lower in fat and are lean sources of protein. General guidelines  Limit alcohol intake to no more than 1 drink a day for nonpregnant women and 2 drinks a day for men. One drink equals 12 oz of beer, 5 oz of wine, or 1 oz of hard liquor. Alcohol can affect the way your body gets rid of uric acid.  Drink plenty of water to keep your urine clear or pale yellow. Fluids can help  remove uric acid from your body.  If directed by your health care provider, take a vitamin C supplement.  Work with your health care provider and dietitian to develop a plan to achieve or maintain a healthy weight. Losing weight can help reduce uric acid in your blood. What foods are recommended? The items listed may not be a complete list. Talk with your dietitian about what dietary choices are best for you. Foods low in purines Foods low in purines do not need to be limited. These include:  All fruits.  All low-purine vegetables, pickles, and olives.  Breads, pasta, rice, cornbread, and popcorn. Cake and other baked goods.  All dairy foods.  Eggs, nuts, and nut butters.  Spices and condiments, such as salt, herbs, and vinegar.  Plant oils, butter, and margarine.  Water, sugar-free soft drinks, tea, coffee, and cocoa.  Vegetable-based soups, broths, sauces, and gravies. Foods moderate in purines Foods moderate in purines should be limited to the amounts listed.   cup of asparagus, cauliflower, spinach, mushrooms, or green peas, each day.  2/3 cup uncooked oatmeal, each day.   cup dry wheat bran or wheat germ, each day.  2-3 ounces of meat or poultry, each day.  4-6 ounces of shellfish, such as crab, lobster, oysters, or shrimp, each day.  1 cup cooked beans, peas, or lentils, each day.  Soup, broths, or bouillon made from meat or   fish. Limit these foods as much as possible. What foods are not recommended? The items listed may not be a complete list. Talk with your dietitian about what dietary choices are best for you. Limit your intake of foods high in purines, including:  Beer and other alcohol.  Meat-based gravy or sauce.  Canned or fresh fish, such as: ? Anchovies, sardines, herring, and tuna. ? Mussels and scallops. ? Codfish, trout, and haddock.  Berniece Salines.  Organ meats, such as: ? Liver or kidney. ? Tripe. ? Sweetbreads (thymus gland or  pancreas).  Wild [employer] biochemist.  Yeast or yeast extract supplements.  Drinks sweetened with high-fructose corn syrup. Summary  Eating a low-purine diet can help control conditions caused by too much uric acid in the body, such as gout or kidney stones.  Choose low-purine foods, limit alcohol, and limit foods high in fat.  You will learn over time which foods do or do not affect you. If you find out that a food tends to cause your gout symptoms to flare up, avoid eating that food. This information is not intended to replace advice given to you by your health care provider. Make sure you discuss any questions you have with your health care provider. Document Revised: 05/05/2017 Document Reviewed: 07/06/2016 Elsevier Patient Education  [place].  Live Zoster (Shingles) Vaccine: What You Need to Know 1. Why get vaccinated? Live zoster (shingles) vaccine can prevent shingles. Shingles (also called herpes zoster, or just zoster) is a painful skin rash, usually with blisters. In addition to the rash, shingles can cause fever, headache, chills, or upset stomach. More rarely, shingles can lead to pneumonia, hearing problems, blindness, brain inflammation (encephalitis), or death.  The most common complication of shingles is long-term nerve pain called postherpetic neuralgia (PHN). PHN occurs in the areas where the shingles rash was, even after the rash clears up. It can last for months or years after the rash goes away. The pain from PHN can be severe and debilitating. About 10 to 18% of people who get shingles will experience PHN. The risk of PHN increases with age. An older adult with shingles is more likely to develop PHN and have longer lasting and more severe pain than a younger person with shingles.  Shingles is caused by the varicella zoster virus, the same virus that causes chickenpox. After you have chickenpox, the virus stays in your body and can cause shingles later in life.  Shingles cannot be passed from one person to another, but the virus that causes shingles can spread and cause chickenpox in someone who had never had chickenpox or received chickenpox vaccine. 2. Live shingles vaccine Live shingles vaccine can provide protection against shingles and PHN.  Another type of shingles vaccine, recombinant shingles vaccine, is the preferred vaccine for the prevention of shingles. However, live shingles vaccine may be used in some circumstances (for example if a person is allergic to recombinant shingles vaccine or prefers live shingles vaccine, or if recombinant shingles vaccine is not available). Adults 60 years and older who get live shingles vaccine should receive 1 dose, administered by injection. Shingles vaccine may be given at the same time as other vaccines. 3. Talk with your health care provider Tell your vaccine provider if the person getting the vaccine:  Has had an allergic reaction after a previous dose of live shingles vaccine or varicella vaccine, or has any severe, life-threatening allergies.  Has a weakened immune system.  Is pregnant or thinks she might  be pregnant.  Is currently experiencing an episode of shingles. In some cases, your health care provider may decide to postpone shingles vaccination to a future visit.  People with minor illnesses, such as a cold, may be vaccinated. People who are moderately or severely ill should usually wait until they recover before getting live shingles vaccine.  Your health care provider can give you more information. 4. Risks of a vaccine reaction  Redness, soreness, swelling, or itching at the site of the injection and headache can happen after live shingles vaccine. Rarely, live shingles vaccine can cause rash or shingles.  People sometimes faint after medical procedures, including vaccination. Tell your provider if you feel dizzy or have vision changes or ringing in the ears.  As with any medicine, there  is a very remote chance of a vaccine causing a severe allergic reaction, other serious injury, or death. 5. What if there is a serious problem? An allergic reaction could occur after the vaccinated person leaves the clinic. If you see signs of a severe allergic reaction (hives, swelling of the face and throat, difficulty breathing, a fast heartbeat, dizziness, or weakness), call 9-1-1 and get the person to the nearest hospital.  For other signs that concern you, call your health care provider.  Adverse reactions should be reported to the Vaccine Adverse Event Reporting System (VAERS). Your health care provider will usually file this report, or you can do it yourself. Visit the VAERS website at www.vaers.SamedayNews.es or call (805)796-3733. VAERS is only for reporting reactions, and VAERS staff do not give medical advice. 6. How can I learn more?  Ask your health care provider.  Call your local or state health department.  Contact the Centers for Disease Control and Prevention (CDC): ? Call 864-307-2840 (1-800-CDC-INFO) or ? Visit CDC's website at http://hunter.com/ CDC Vaccine Information Statement Live Zoster Vaccine (04/04/2018) This information is not intended to replace advice given to you by your health care provider. Make sure you discuss any questions you have with your health care provider. Document Revised: 09/11/2018 Document Reviewed: 01/02/2018 Elsevier Patient Education  [place].

## 2020-01-17 NOTE — Assessment & Plan Note (Signed)
Improved last check, obtain A1C today and urine ALB.

## 2020-01-17 NOTE — Assessment & Plan Note (Signed)
Chronic, stable -- continue 100% use of CPAP at home.   

## 2020-01-17 NOTE — Assessment & Plan Note (Addendum)
Chronic, ongoing.  Continue current medication regimen and adjust as needed -- is on twice weekly Crestor due to myalgia with increased doses.  Will plan on repeat lipid panel and CMP today.  Refills sent in.  Educated him on stroke prevention goals: BP <130/90 LDL <70 A1C <6.5 

## 2020-01-18 LAB — COMPREHENSIVE METABOLIC PANEL
ALT: 22 IU/L (ref 0–44)
AST: 21 IU/L (ref 0–40)
Albumin/Globulin Ratio: 1.4 (ref 1.2–2.2)
Albumin: 4.3 g/dL (ref 3.8–4.9)
Alkaline Phosphatase: 82 IU/L (ref 48–121)
BUN/Creatinine Ratio: 21 — ABNORMAL HIGH (ref 9–20)
BUN: 22 mg/dL (ref 6–24)
Bilirubin Total: 0.4 mg/dL (ref 0.0–1.2)
CO2: 26 mmol/L (ref 20–29)
Calcium: 9.4 mg/dL (ref 8.7–10.2)
Chloride: 98 mmol/L (ref 96–106)
Creatinine, Ser: 1.06 mg/dL (ref 0.76–1.27)
GFR calc Af Amer: 88 mL/min/{1.73_m2} (ref >59)
GFR calc non Af Amer: 76 mL/min/{1.73_m2} (ref >59)
Globulin, Total: 3 g/dL (ref 1.5–4.5)
Glucose: 113 mg/dL — ABNORMAL HIGH (ref 65–99)
Potassium: 3.8 mmol/L (ref 3.5–5.2)
Sodium: 137 mmol/L (ref 134–144)
Total Protein: 7.3 g/dL (ref 6.0–8.5)

## 2020-01-18 LAB — CBC WITH DIFFERENTIAL/PLATELET
Basophils Absolute: 0 10*3/uL (ref 0.0–0.2)
Basos: 0 %
EOS (ABSOLUTE): 0.1 10*3/uL (ref 0.0–0.4)
Eos: 2 %
Hematocrit: 41.2 % (ref 37.5–51.0)
Hemoglobin: 13.5 g/dL (ref 13.0–17.7)
Immature Grans (Abs): 0 10*3/uL (ref 0.0–0.1)
Immature Granulocytes: 0 %
Lymphocytes Absolute: 1.3 10*3/uL (ref 0.7–3.1)
Lymphs: 20 %
MCH: 28 pg (ref 26.6–33.0)
MCHC: 32.8 g/dL (ref 31.5–35.7)
MCV: 86 fL (ref 79–97)
Monocytes Absolute: 0.5 10*3/uL (ref 0.1–0.9)
Monocytes: 8 %
Neutrophils Absolute: 4.4 10*3/uL (ref 1.4–7.0)
Neutrophils: 70 %
Platelets: 257 10*3/uL (ref 150–450)
RBC: 4.82 x10E6/uL (ref 4.14–5.80)
RDW: 13.1 % (ref 11.6–15.4)
WBC: 6.3 10*3/uL (ref 3.4–10.8)

## 2020-01-18 LAB — LIPID PANEL W/O CHOL/HDL RATIO
Cholesterol, Total: 182 mg/dL (ref 100–199)
HDL: 34 mg/dL — ABNORMAL LOW (ref >39)
LDL Chol Calc (NIH): 114 mg/dL — ABNORMAL HIGH (ref 0–99)
Triglycerides: 190 mg/dL — ABNORMAL HIGH (ref 0–149)
VLDL Cholesterol Cal: 34 mg/dL (ref 5–40)

## 2020-01-18 LAB — HEMOGLOBIN A1C
Est. average glucose Bld gHb Est-mCnc: 128 mg/dL
Hgb A1c MFr Bld: 6.1 % — ABNORMAL HIGH (ref 4.8–5.6)

## 2020-01-18 LAB — PSA: Prostate Specific Ag, Serum: 1.4 ng/mL (ref 0.0–4.0)

## 2020-01-18 LAB — URIC ACID: Uric Acid: 11.1 mg/dL — ABNORMAL HIGH (ref 3.8–8.4)

## 2020-01-18 LAB — TSH: TSH: 1.95 u[IU]/mL (ref 0.450–4.500)

## 2020-01-19 NOTE — Progress Notes (Signed)
Contacted via MyChart  Good evening Paul Cannon, your labs have returned: - CBC shows no anemia.  Kidney and liver function are normal.  Thyroid and prostate labs normal. - A1C is creeping up a little again, remains prediabetic range.  Last visit it was 5.8 and now 6.1.  Goal is to keep this less than 6.5, as this number or greater is considered diabetes.  Continue focus on diet. - Cholesterol levels are above goal, would be good to go up on Rosuvastatin to 40 MG to help with stroke prevention.  Are you okay with doing this?  If so I will send in new prescription.  LDL goal for stroke prevention is less than 70. - Uric acid level is elevated at 11.1, suspect you may have some gout issues present.  May be good to start you on medication to help lower this uric acid level.  It is called Allopurinol.  Would you like to start this?  Also focus on diet, I provided information on this at your visit.  We will recheck uric acid level next visit.  Any questions? Keep being awesome!! Kindest regards, Zayvien Canning

## 2020-01-21 ENCOUNTER — Other Ambulatory Visit: Payer: Self-pay | Admitting: Nurse Practitioner

## 2020-01-21 DIAGNOSIS — E783 Hyperchylomicronemia: Secondary | ICD-10-CM

## 2020-01-21 DIAGNOSIS — E790 Hyperuricemia without signs of inflammatory arthritis and tophaceous disease: Secondary | ICD-10-CM

## 2020-01-21 MED ORDER — ROSUVASTATIN CALCIUM 40 MG PO TABS: 40.0000 mg | ORAL_TABLET | ORAL | 4 refills | Status: DC

## 2020-01-21 MED ORDER — ALLOPURINOL 100 MG PO TABS: 100.0000 mg | ORAL_TABLET | Freq: Every day | ORAL | 4 refills | Status: DC

## 2020-03-02 ENCOUNTER — Other Ambulatory Visit: Payer: Self-pay

## 2020-03-02 ENCOUNTER — Other Ambulatory Visit: Payer: Commercial Managed Care - PPO

## 2020-03-02 DIAGNOSIS — E790 Hyperuricemia without signs of inflammatory arthritis and tophaceous disease: Secondary | ICD-10-CM

## 2020-03-02 DIAGNOSIS — E783 Hyperchylomicronemia: Secondary | ICD-10-CM

## 2020-03-03 LAB — LIPID PANEL W/O CHOL/HDL RATIO
Cholesterol, Total: 147 mg/dL (ref 100–199)
HDL: 39 mg/dL — ABNORMAL LOW (ref >39)
LDL Chol Calc (NIH): 78 mg/dL (ref 0–99)
Triglycerides: 177 mg/dL — ABNORMAL HIGH (ref 0–149)
VLDL Cholesterol Cal: 30 mg/dL (ref 5–40)

## 2020-03-03 LAB — BASIC METABOLIC PANEL
BUN/Creatinine Ratio: 18 (ref 9–20)
BUN: 23 mg/dL (ref 6–24)
CO2: 25 mmol/L (ref 20–29)
Calcium: 9.2 mg/dL (ref 8.7–10.2)
Chloride: 101 mmol/L (ref 96–106)
Creatinine, Ser: 1.28 mg/dL — ABNORMAL HIGH (ref 0.76–1.27)
GFR calc Af Amer: 70 mL/min/{1.73_m2} (ref >59)
GFR calc non Af Amer: 61 mL/min/{1.73_m2} (ref >59)
Glucose: 101 mg/dL — ABNORMAL HIGH (ref 65–99)
Potassium: 4 mmol/L (ref 3.5–5.2)
Sodium: 138 mmol/L (ref 134–144)

## 2020-03-03 LAB — URIC ACID: Uric Acid: 8.3 mg/dL (ref 3.8–8.4)

## 2020-06-06 DIAGNOSIS — M109 Gout, unspecified: Secondary | ICD-10-CM

## 2020-06-06 HISTORY — DX: Gout, unspecified: M10.9

## 2020-07-13 ENCOUNTER — Other Ambulatory Visit: Payer: Self-pay | Admitting: Nurse Practitioner

## 2020-07-13 MED ORDER — ALLOPURINOL 100 MG PO TABS: 200.0000 mg | ORAL_TABLET | Freq: Every day | ORAL | 4 refills | Status: DC

## 2020-07-21 ENCOUNTER — Other Ambulatory Visit: Payer: Self-pay

## 2020-07-21 ENCOUNTER — Ambulatory Visit (INDEPENDENT_AMBULATORY_CARE_PROVIDER_SITE_OTHER): Payer: Commercial Managed Care - PPO | Admitting: Nurse Practitioner

## 2020-07-21 ENCOUNTER — Encounter: Payer: Self-pay | Admitting: Nurse Practitioner

## 2020-07-21 VITALS — BP 112/72 | HR 58 | Temp 98.5°F | Wt 305.6 lb

## 2020-07-21 DIAGNOSIS — E783 Hyperchylomicronemia: Secondary | ICD-10-CM

## 2020-07-21 DIAGNOSIS — Z8673 Personal history of transient ischemic attack (TIA), and cerebral infarction without residual deficits: Secondary | ICD-10-CM

## 2020-07-21 DIAGNOSIS — G4733 Obstructive sleep apnea (adult) (pediatric): Secondary | ICD-10-CM

## 2020-07-21 DIAGNOSIS — R7301 Impaired fasting glucose: Secondary | ICD-10-CM | POA: Diagnosis not present

## 2020-07-21 DIAGNOSIS — M1A0720 Idiopathic chronic gout, left ankle and foot, without tophus (tophi): Secondary | ICD-10-CM | POA: Insufficient documentation

## 2020-07-21 DIAGNOSIS — I10 Essential (primary) hypertension: Secondary | ICD-10-CM

## 2020-07-21 LAB — MICROALBUMIN, URINE WAIVED
Creatinine, Urine Waived: 300 mg/dL (ref 10–300)
Microalb, Ur Waived: 30 mg/L — ABNORMAL HIGH (ref 0–19)
Microalb/Creat Ratio: <30 mg/g (ref <30)

## 2020-07-21 MED ORDER — GABAPENTIN 300 MG PO CAPS
300.0000 mg | ORAL_CAPSULE | Freq: Every day | ORAL | 3 refills | Status: DC
Start: 2020-07-21 — End: 2021-07-14

## 2020-07-21 MED ORDER — COLCHICINE 0.6 MG PO TABS
0.6000 mg | ORAL_TABLET | Freq: Every day | ORAL | 0 refills | Status: DC
Start: 2020-07-21 — End: 2020-08-19

## 2020-07-21 NOTE — Assessment & Plan Note (Signed)
No further TIA episodes.  Continue current medication regimen and collaboration with cardiology and neurology as needed.  CMP today.

## 2020-07-21 NOTE — Progress Notes (Signed)
BP 112/72   Pulse (!) 58   Temp 98.5 F (36.9 C) (Oral)   Wt (!) 305 lb 9.6 oz (138.6 kg)   SpO2 98%   BMI 42.62 kg/m    Subjective:    Patient ID: Paul Cannon, male    DOB: 10-19-1960, 60 y.o.   MRN: [id number]  HPI: Paul Cannon is a 60 y.o. male  Chief Complaint  Patient presents with  . Follow-up    Pt states he has been doing well with his blood pressures reading and then he noticed here recently that his blood pressure reading have become elevated. Pt states he has been having issues with gout as well. Pt states he would like to have his uric acid level checked at today's visit.  Marland Kitchen Hypertension  . Hyperlipidemia   HYPERTENSION / HYPERLIPIDEMIA Followed by cardiology and neurology with last visit with cardiology on 10/17/19 -- does not need to return unless needed.  History of CVA, lacunar infarcts.  Had MYO screen in 2021, normal with EF 55-65%. Continues on Micardis, Amlodipine, Chlorthalidone, ASA, Crestor.  Has underlying sleep apnea, he continues to use CPAP nightly.  Goes to [employer] for supplies and downloads.  Satisfied with current treatment? yes Duration of hypertension: chronic BP monitoring frequency: daily BP range: 117/72 to 152/88 --- average 130/70 range on review of his documentation, checking every morning with HR 60-70's BP medication side effects: no Duration of hyperlipidemia: chronic Cholesterol medication side effects: no Cholesterol supplements: none Medication compliance: good compliance Aspirin: yes Recent stressors: no Recurrent headaches: no Visual changes: no Palpitations: no Dyspnea: no Chest pain: no Lower extremity edema: no Dizzy/lightheaded: no   PREDIABETES: A1C recently 6.1% in August.  Had lost some weight last year, but has gained some back recently. Polydipsia/polyuria: no Visual disturbance: no Chest pain: no Paresthesias: no   GOUT Last uric acid level 8.3 in September.  He reports taking some of his  wife's Gabapentin on occasion which helps his knee pain when present. Taking Allopurinol 200 MG daily. This recent flare was 4th flare he has had -- has improved at this time.   Duration:chronic Right 1st metatarsophalangeal pain: no Left 1st metatarsophalangeal pain: yes Right knee pain: no Left knee pain: no Severity: moderate  Quality: dull and aching Swelling: no Redness: no Trauma: no Recent dietary change or indiscretion: shrimp and flounder Fevers: no Nausea/vomiting: no Aggravating factors: unknown Alleviating factors: Allopurinol Status:  stable Treatments attempted: Allopurinol  Relevant past medical, surgical, family and social history reviewed and updated as indicated. Interim medical history since our last visit reviewed. Allergies and medications reviewed and updated.  Review of Systems  Constitutional: Negative for activity change, diaphoresis, fatigue and fever.  Respiratory: Negative for cough, chest tightness, shortness of breath and wheezing.   Cardiovascular: Negative for chest pain, palpitations and leg swelling.  Gastrointestinal: Negative.   Endocrine: Negative for cold intolerance, heat intolerance, polydipsia, polyphagia and polyuria.  Musculoskeletal: Positive for arthralgias.  Neurological: Negative.   Psychiatric/Behavioral: Negative.     Per HPI unless specifically indicated above     Objective:    BP 112/72   Pulse (!) 58   Temp 98.5 F (36.9 C) (Oral)   Wt (!) 305 lb 9.6 oz (138.6 kg)   SpO2 98%   BMI 42.62 kg/m   Wt Readings from Last 3 Encounters:  07/21/20 (!) 305 lb 9.6 oz (138.6 kg)  01/17/20 299 lb 6.4 oz (135.8 kg)  07/03/19 290 lb (131.5 kg)  Physical Exam Vitals and nursing note reviewed.  Constitutional:      General: He is awake. He is not in acute distress.    Appearance: He is well-developed. He is obese. He is not ill-appearing.  HENT:     Head: Normocephalic and atraumatic.     Right Ear: Hearing normal. No  drainage.     Left Ear: Hearing normal. No drainage.  Eyes:     General: Lids are normal.        Right eye: No discharge.        Left eye: No discharge.     Conjunctiva/sclera: Conjunctivae normal.     Pupils: Pupils are equal, round, and reactive to light.  Neck:     Thyroid: No thyromegaly.     Vascular: No carotid bruit.  Cardiovascular:     Rate and Rhythm: Normal rate and regular rhythm.     Heart sounds: Normal heart sounds, S1 normal and S2 normal. No murmur heard. No gallop.   Pulmonary:     Effort: Pulmonary effort is normal. No accessory muscle usage or respiratory distress.     Breath sounds: Normal breath sounds.  Abdominal:     General: Bowel sounds are normal.     Palpations: Abdomen is soft.  Musculoskeletal:        General: Normal range of motion.     Cervical back: Normal range of motion and neck supple.     Right lower leg: 1+ Edema present.     Left lower leg: 1+ Edema present.  Lymphadenopathy:     Cervical: No cervical adenopathy.  Skin:    General: Skin is warm and dry.  Neurological:     Mental Status: He is alert and oriented to person, place, and time.  Psychiatric:        Mood and Affect: Mood normal.        Behavior: Behavior normal. Behavior is cooperative.        Thought Content: Thought content normal.        Judgment: Judgment normal.    Results for orders placed or performed in visit on 03/02/20  Lipid Panel w/o Chol/HDL Ratio  Result Value Ref Range   Cholesterol, Total 147 100 - 199 mg/dL   Triglycerides 177 (H) 0 - 149 mg/dL   HDL 39 (L) >39 mg/dL   VLDL Cholesterol Cal 30 5 - 40 mg/dL   LDL Chol Calc (NIH) 78 0 - 99 mg/dL  Basic metabolic panel  Result Value Ref Range   Glucose 101 (H) 65 - 99 mg/dL   BUN 23 6 - 24 mg/dL   Creatinine, Ser 1.28 (H) 0.76 - 1.27 mg/dL   GFR calc non Af Amer 61 >59 mL/min/1.73   GFR calc Af Amer 70 >59 mL/min/1.73   BUN/Creatinine Ratio 18 9 - 20   Sodium 138 134 - 144 mmol/L   Potassium 4.0 3.5  - 5.2 mmol/L   Chloride 101 96 - 106 mmol/L   CO2 25 20 - 29 mmol/L   Calcium 9.2 8.7 - 10.2 mg/dL  Uric acid  Result Value Ref Range   Uric Acid 8.3 3.8 - 8.4 mg/dL      Assessment & Plan:   Problem List Items Addressed This Visit      Cardiovascular and Mediastinum   Hypertension - Primary    Chronic, stable with home BP at goal in office and at home.  Continue current medication regimen and recommend DASH diet at home, lowering  sodium in diet to avoid fluid retention.  Adjust regimen as needed.  Continue to collaborate with cardiology as needed.  Return to office in 6 months.  Labs today CMP & TSH.  Will further consider discontinuation of Chlorthalidone if ongoing gout flares and possibly need to start Hydralazine or Clonidine -- avoid BB due to low resting HR.         Relevant Orders   Microalbumin, Urine Waived   TSH     Respiratory   Sleep apnea    Chronic, stable -- continue 100% use of CPAP at home.        Endocrine   IFG (impaired fasting glucose)    Ongoing and monitored -- diet focus, obtain A1C today and urine ALB.      Relevant Orders   Comprehensive metabolic panel   HgB N6E     Musculoskeletal and Integument   Chronic idiopathic gout involving toe of left foot without tophus    Chronic, ongoing with recent increased flares.  Currently stable.  Check uric acid level today and increase Allopurinol to 300 MG daily if elevated.  Colchicine script sent for use during acute flares and Gabapentin for his chronic knee pain as needed. Consider discontinuation of Chlorthalidone if ongoing flares.         Relevant Medications   colchicine 0.6 MG tablet   Other Relevant Orders   Uric acid     Other   Hyperlipidemia    Chronic, ongoing.  Continue current medication regimen and adjust as needed -- is on twice weekly Crestor due to myalgia with increased doses.  Will plan on repeat lipid panel and CMP today.  Refills sent in.  Educated him on stroke prevention  goals: BP <130/90 LDL <70 A1C <6.5      Relevant Orders   Lipid Panel w/o Chol/HDL Ratio   Comprehensive metabolic panel   Morbid obesity (HCC)    BMI 42.62 today, is working on losing recent weight he gained while traveling.  Recommended eating smaller high protein, low fat meals more frequently and exercising 30 mins a day 5 times a week with a goal of 10-15lb weight loss in the next 3 months. Patient voiced their understanding and motivation to adhere to these recommendations.       History of stroke    No further TIA episodes.  Continue current medication regimen and collaboration with cardiology and neurology as needed.  CMP today.          Follow up plan: Return in about 6 months (around 01/18/2021) for Annual physical.

## 2020-07-21 NOTE — Assessment & Plan Note (Signed)
Chronic, ongoing.  Continue current medication regimen and adjust as needed -- is on twice weekly Crestor due to myalgia with increased doses.  Will plan on repeat lipid panel and CMP today.  Refills sent in.  Educated him on stroke prevention goals: BP <130/90 LDL <70 A1C <6.5

## 2020-07-21 NOTE — Assessment & Plan Note (Signed)
Chronic, stable -- continue 100% use of CPAP at home.   

## 2020-07-21 NOTE — Assessment & Plan Note (Addendum)
Ongoing and monitored -- diet focus, obtain A1C today and urine ALB.

## 2020-07-21 NOTE — Assessment & Plan Note (Signed)
Chronic, stable with home BP at goal in office and at home.  Continue current medication regimen and recommend DASH diet at home, lowering sodium in diet to avoid fluid retention.  Adjust regimen as needed.  Continue to collaborate with cardiology as needed.  Return to office in 6 months.  Labs today CMP & TSH.  Will further consider discontinuation of Chlorthalidone if ongoing gout flares and possibly need to start Hydralazine or Clonidine -- avoid BB due to low resting HR.

## 2020-07-21 NOTE — Assessment & Plan Note (Signed)
Chronic, ongoing with recent increased flares.  Currently stable.  Check uric acid level today and increase Allopurinol to 300 MG daily if elevated.  Colchicine script sent for use during acute flares and Gabapentin for his chronic knee pain as needed. Consider discontinuation of Chlorthalidone if ongoing flares.

## 2020-07-21 NOTE — Patient Instructions (Signed)

## 2020-07-21 NOTE — Assessment & Plan Note (Signed)
BMI 42.62 today, is working on losing recent weight he gained while traveling.  Recommended eating smaller high protein, low fat meals more frequently and exercising 30 mins a day 5 times a week with a goal of 10-15lb weight loss in the next 3 months. Patient voiced their understanding and motivation to adhere to these recommendations.

## 2020-07-22 LAB — COMPREHENSIVE METABOLIC PANEL
ALT: 27 IU/L (ref 0–44)
AST: 22 IU/L (ref 0–40)
Albumin/Globulin Ratio: 1.5 (ref 1.2–2.2)
Albumin: 4.3 g/dL (ref 3.8–4.9)
Alkaline Phosphatase: 90 IU/L (ref 44–121)
BUN/Creatinine Ratio: 18 (ref 9–20)
BUN: 20 mg/dL (ref 6–24)
Bilirubin Total: 0.3 mg/dL (ref 0.0–1.2)
CO2: 24 mmol/L (ref 20–29)
Calcium: 9.2 mg/dL (ref 8.7–10.2)
Chloride: 101 mmol/L (ref 96–106)
Creatinine, Ser: 1.09 mg/dL (ref 0.76–1.27)
GFR calc Af Amer: 85 mL/min/{1.73_m2} (ref >59)
GFR calc non Af Amer: 74 mL/min/{1.73_m2} (ref >59)
Globulin, Total: 2.9 g/dL (ref 1.5–4.5)
Glucose: 123 mg/dL — ABNORMAL HIGH (ref 65–99)
Potassium: 4 mmol/L (ref 3.5–5.2)
Sodium: 143 mmol/L (ref 134–144)
Total Protein: 7.2 g/dL (ref 6.0–8.5)

## 2020-07-22 LAB — HEMOGLOBIN A1C
Est. average glucose Bld gHb Est-mCnc: 117 mg/dL
Hgb A1c MFr Bld: 5.7 % — ABNORMAL HIGH (ref 4.8–5.6)

## 2020-07-22 LAB — LIPID PANEL W/O CHOL/HDL RATIO
Cholesterol, Total: 127 mg/dL (ref 100–199)
HDL: 40 mg/dL (ref >39)
LDL Chol Calc (NIH): 63 mg/dL (ref 0–99)
Triglycerides: 137 mg/dL (ref 0–149)
VLDL Cholesterol Cal: 24 mg/dL (ref 5–40)

## 2020-07-22 LAB — URIC ACID: Uric Acid: 7.6 mg/dL (ref 3.8–8.4)

## 2020-07-22 LAB — TSH: TSH: 2.91 u[IU]/mL (ref 0.450–4.500)

## 2020-07-22 NOTE — Progress Notes (Signed)
Contacted via [place] afternoon Paul Cannon, your labs have returned.  Cholesterol levels are at goal, continue current medication.  Great job!!  Kidney function and electrolytes look great, with exception of mild elevation in glucose at 123, but A1c is prediabetic still at 5.7%.   Any number 5.7 to 6.4 is considered prediabetes and any number 6.5 or greater is considered diabetes.   I would recommend heavy focus on decreasing foods high in sugar and your intake of things like bread products, pasta, and rice.  The American Diabetes Association online has a large amount of information on diet changes to make.  Thyroid level is normal.  Uric acid level is mildly above goal, would like to see less then 6 and it is getting there.  It is less then previous checks, continue Allopurinol 200 MG daily and we may even go up to 300 MG daily if ongoing flares.  Any questions? Keep being awesome!!  Thank you for allowing me to participate in your care. Kindest regards, Kenji Mapel

## 2020-08-19 ENCOUNTER — Other Ambulatory Visit: Payer: Self-pay | Admitting: Nurse Practitioner

## 2020-08-19 NOTE — Telephone Encounter (Signed)
Requested Prescriptions  Pending Prescriptions Disp Refills  . colchicine 0.6 MG tablet [Pharmacy Med Name: COLCHICINE 0.6 MG TABLET] 30 tablet 0    Sig: TAKE 1 TABLET (0.6 MG TOTAL) BY MOUTH DAILY. USE ONLY WHEN ACUTE FLARE OF GOUT PRESENT.     Endocrinology:  Gout Agents Passed - 08/19/2020  8:07 PM      Passed - Uric Acid in normal range and within 360 days    Uric Acid  Date Value Ref Range Status  07/21/2020 7.6 3.8 - 8.4 mg/dL Final    Comment:               Therapeutic target for gout patients: <6.0         Passed - Cr in normal range and within 360 days    Creatinine, Ser  Date Value Ref Range Status  07/21/2020 1.09 0.76 - 1.27 mg/dL Final         Passed - Valid encounter within last 12 months    Recent Outpatient Visits          4 weeks ago Primary hypertension   [facility], Henrine Screws T, NP   7 months ago Routine general medical examination at a health care facility   [facility], Henrine Screws T, NP   1 year ago Morbid obesity ([place])   [place], Barbaraann Faster, NP   1 year ago Morbid obesity ([place])   [place], Henrine Screws T, NP   1 year ago Cerebrovascular accident (CVA), unspecified mechanism ([place])   [place], Barbaraann Faster, NP      Future Appointments            In 5 months [place], Barbaraann Faster, NP [facility], [facility]

## 2020-08-24 ENCOUNTER — Other Ambulatory Visit: Payer: Self-pay | Admitting: Nurse Practitioner

## 2020-08-24 NOTE — Telephone Encounter (Signed)
Requested medication (s) are due for refill today: no  Requested medication (s) are on the active medication list: yes  Last refill: 08/18/2020  Future visit scheduled:yes  Notes to clinic:  this refill cannot be delegated    Requested Prescriptions  Pending Prescriptions Disp Refills   cyclobenzaprine (FLEXERIL) 10 MG tablet [Pharmacy Med Name: CYCLOBENZAPRINE 10 MG TABLET] 30 tablet 1    Sig: TAKE 1 TABLET BY MOUTH AT BEDTIME AS NEEDED      Not Delegated - Analgesics:  Muscle Relaxants Failed - 08/24/2020 10:03 AM      Failed - This refill cannot be delegated      Passed - Valid encounter within last 6 months    Recent Outpatient Visits           1 month ago Primary hypertension   [place], Henrine Screws T, NP   7 months ago Routine general medical examination at a health care facility   [place], [place]seo T, NP   1 year ago Morbid obesity ([place])   [place], Barbaraann Faster, NP   1 year ago Morbid obesity ([place])   [place], Henrine Screws T, NP   1 year ago Cerebrovascular accident (CVA), unspecified mechanism ([place])   [place], Barbaraann Faster, NP       Future Appointments             In 4 months [place], Barbaraann Faster, NP [facility]

## 2020-08-24 NOTE — Telephone Encounter (Signed)
Routing to provider  

## 2020-09-19 ENCOUNTER — Other Ambulatory Visit: Payer: Self-pay | Admitting: Nurse Practitioner

## 2020-09-19 NOTE — Telephone Encounter (Signed)
Requested Prescriptions  Pending Prescriptions Disp Refills  . colchicine 0.6 MG tablet [Pharmacy Med Name: COLCHICINE 0.6 MG TABLET] 30 tablet 0    Sig: TAKE 1 TABLET (0.6 MG TOTAL) BY MOUTH DAILY. USE ONLY WHEN ACUTE FLARE OF GOUT PRESENT.     Endocrinology:  Gout Agents Passed - 09/19/2020  3:20 PM      Passed - Uric Acid in normal range and within 360 days    Uric Acid  Date Value Ref Range Status  07/21/2020 7.6 3.8 - 8.4 mg/dL Final    Comment:               Therapeutic target for gout patients: <6.0         Passed - Cr in normal range and within 360 days    Creatinine, Ser  Date Value Ref Range Status  07/21/2020 1.09 0.76 - 1.27 mg/dL Final         Passed - Valid encounter within last 12 months    Recent Outpatient Visits          2 months ago Primary hypertension   [place], Henrine Screws T, NP   8 months ago Routine general medical examination at a health care facility   [facility], Henrine Screws T, NP   1 year ago Morbid obesity ([place])   [place], Barbaraann Faster, NP   1 year ago Morbid obesity ([place])   [place], Henrine Screws T, NP   1 year ago Cerebrovascular accident (CVA), unspecified mechanism ([place])   [place], Barbaraann Faster, NP      Future Appointments            In 4 months Cannady, Barbaraann Faster, NP [facility]

## 2020-10-11 ENCOUNTER — Other Ambulatory Visit: Payer: Self-pay | Admitting: Nurse Practitioner

## 2020-10-11 NOTE — Telephone Encounter (Signed)
Requested Prescriptions  Pending Prescriptions Disp Refills  . colchicine 0.6 MG tablet [Pharmacy Med Name: COLCHICINE 0.6 MG TABLET] 30 tablet 0    Sig: TAKE 1 TABLET (0.6 MG TOTAL) BY MOUTH DAILY. USE ONLY WHEN ACUTE FLARE OF GOUT PRESENT.     Endocrinology:  Gout Agents Passed - 10/11/2020 11:30 AM      Passed - Uric Acid in normal range and within 360 days    Uric Acid  Date Value Ref Range Status  07/21/2020 7.6 3.8 - 8.4 mg/dL Final    Comment:               Therapeutic target for gout patients: <6.0         Passed - Cr in normal range and within 360 days    Creatinine, Ser  Date Value Ref Range Status  07/21/2020 1.09 0.76 - 1.27 mg/dL Final         Passed - Valid encounter within last 12 months    Recent Outpatient Visits          2 months ago Primary hypertension   [facility] [place], Henrine Screws T, NP   8 months ago Routine general medical examination at a health care facility   [facility], Henrine Screws T, NP   1 year ago Morbid obesity ([place])   [place], Barbaraann Faster, NP   1 year ago Morbid obesity ([place])   [place], Henrine Screws T, NP   1 year ago Cerebrovascular accident (CVA), unspecified mechanism ([place])   [place], Barbaraann Faster, NP      Future Appointments            In 3 months Cannady, Barbaraann Faster, NP [facility]

## 2020-11-03 ENCOUNTER — Other Ambulatory Visit: Payer: Self-pay

## 2020-11-03 ENCOUNTER — Ambulatory Visit
Admission: RE | Admit: 2020-11-03 | Discharge: 2020-11-03 | Disposition: A | Discharge status: Home / Self Care | Payer: Commercial Managed Care - PPO | Source: Ambulatory Visit | Attending: Nurse Practitioner | Admitting: Nurse Practitioner

## 2020-11-03 ENCOUNTER — Encounter: Payer: Self-pay | Admitting: Nurse Practitioner

## 2020-11-03 ENCOUNTER — Ambulatory Visit (INDEPENDENT_AMBULATORY_CARE_PROVIDER_SITE_OTHER): Payer: Commercial Managed Care - PPO | Admitting: Nurse Practitioner

## 2020-11-03 DIAGNOSIS — G4733 Obstructive sleep apnea (adult) (pediatric): Secondary | ICD-10-CM

## 2020-11-03 DIAGNOSIS — I10 Essential (primary) hypertension: Secondary | ICD-10-CM | POA: Diagnosis not present

## 2020-11-03 DIAGNOSIS — R29810 Facial weakness: Secondary | ICD-10-CM

## 2020-11-03 DIAGNOSIS — M1A0720 Idiopathic chronic gout, left ankle and foot, without tophus (tophi): Secondary | ICD-10-CM

## 2020-11-03 DIAGNOSIS — Z8673 Personal history of transient ischemic attack (TIA), and cerebral infarction without residual deficits: Secondary | ICD-10-CM | POA: Insufficient documentation

## 2020-11-03 DIAGNOSIS — G9389 Other specified disorders of brain: Secondary | ICD-10-CM

## 2020-11-03 DIAGNOSIS — E783 Hyperchylomicronemia: Secondary | ICD-10-CM

## 2020-11-03 DIAGNOSIS — R5383 Other fatigue: Secondary | ICD-10-CM | POA: Insufficient documentation

## 2020-11-03 DIAGNOSIS — R7301 Impaired fasting glucose: Secondary | ICD-10-CM

## 2020-11-03 MED ORDER — COLCHICINE 0.6 MG PO TABS: 0.6000 mg | ORAL_TABLET | Freq: Every day | ORAL | 0 refills | Status: DC

## 2020-11-03 MED ORDER — GADOBUTROL 1 MMOL/ML IV SOLN
10.0000 mL | Freq: Once | INTRAVENOUS | Status: AC | PRN
  Administered 2020-11-03: 10 mL via INTRAVENOUS

## 2020-11-03 NOTE — Assessment & Plan Note (Signed)
Chronic, ongoing with recent increased flares.  Check uric acid level today and increase Allopurinol to 300 MG daily if elevated.  Colchicine for use during acute flares and Gabapentin for his chronic knee pain as needed. Consider discontinuation of Chlorthalidone if ongoing flares.  Return in one week.

## 2020-11-03 NOTE — Assessment & Plan Note (Addendum)
Acute events noted by his wife over past week.  Obtain STAT MRI today and assess -- if any changes will send to ER for further evaluation, although past acute treatment window.  Also consider neurology return if changes noted on imaging.  Continue statin and ASA for prevention.  BP at goal today.  Check A1c, CMP, CBC.

## 2020-11-03 NOTE — Assessment & Plan Note (Signed)
Chronic, stable -- continue 100% use of CPAP at home.   

## 2020-11-03 NOTE — Addendum Note (Signed)
Addended by: Marnee Guarneri T on: 11/03/2020 07:16 PM   Modules accepted: Orders

## 2020-11-03 NOTE — Assessment & Plan Note (Signed)
Acute since retirement -- ?acute CVA event vs depression.  PHQ 9 = 5 today and he does endorse some depressed mood post retirement.  Refer to facial droop plan of care for further.  At this time will hold off on starting medication for mood unless MRI returns normal, then may consider.  Labs today.

## 2020-11-03 NOTE — Progress Notes (Signed)
Notified by radiologist Dr. 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  White matter surrounding noted to have edema.  An 8 mm midline shift to left is noted.  This is a new finding from previous imaging in August 2020.  Spoke to patient and wife on telephone, at this time patient does not wish to go to ER which at this time is appropriate -- there are aware to attend ER if any seizure like activity or worsening symptoms present.  Will place an urgent referral to oncology for further assessment and recommendations, patient and wife are aware.

## 2020-11-03 NOTE — Patient Instructions (Signed)

## 2020-11-03 NOTE — Assessment & Plan Note (Signed)
Chronic, ongoing.  Continue current medication regimen and adjust as needed -- is on twice weekly Crestor due to myalgia with increased doses.  Will plan on repeat CMP today -- add on lipid next visit. Educated him on stroke prevention goals: BP <130/90 LDL <70 A1C <6.5

## 2020-11-03 NOTE — Assessment & Plan Note (Signed)
In August 2020, with current acute facial droop and slurred speech episodes over past week reported by wife.  Obtain STAT MRI today and assess -- if any changes will send to ER for further evaluation, although past acute treatment window.  Also consider neurology return if changes noted on imaging.  Continue statin and ASA for prevention.  BP at goal today.  Check A1c, CMP, CBC.

## 2020-11-03 NOTE — Assessment & Plan Note (Signed)
Ongoing and monitored -- diet focus, obtain A1C today.

## 2020-11-03 NOTE — Progress Notes (Signed)
BP 113/67   Pulse 66   Temp 98.7 F (37.1 C) (Oral)   Wt 296 lb 3.2 oz (134.4 kg)   SpO2 91%   BMI 41.31 kg/m    Subjective:    Patient ID: Paul Cannon, male    DOB: 1960-12-25, 60 y.o.   MRN: [id number]  HPI: Paul Cannon is a 60 y.o. male  Chief Complaint  Patient presents with  . Fatigue    Patient states starting last week he has been having issues with being extremely tired as to where he will sit down for 5 minutes and he is asleep. Patient wife states he is sleeping a lot lately. Patient wife states she noticed he will fall asleep.  Patient state he feels lethargic.  Marland Kitchen Gout    Patient states he starting becoming symptomatic last week and thinks that he is currently in the middle of a flare currently. Patient is noticing he is having a lot of flare up a lot of lately. Patient sates when he feels a flare coming on he takes his medications. Patient states this time the pain is radiating up to his left knee. Patient states he would like to discuss change in therapy possibly.    HYPERTENSION / HYPERLIPIDEMIA Followed by cardiology with last visit on 10/17/19 -- does not need to return unless needed.  History of CVA, lacunar infarcts.  Had MYO screen in 2021, normal with EF 55-65%. Continues on Micardis, Amlodipine, Chlorthalidone, ASA, Crestor.  Has underlying sleep apnea, he continues to use CPAP nightly.  Goes to [employer] for supplies and downloads.  Satisfied with current treatment? yes Duration of hypertension: chronic BP monitoring frequency: daily BP range: 130/70's  BP medication side effects: no Duration of hyperlipidemia: chronic Cholesterol medication side effects: no Cholesterol supplements: none Medication compliance: good compliance Aspirin: yes Recent stressors: no Recurrent headaches: no Visual changes: no Palpitations: no Dyspnea: no Chest pain: no Lower extremity edema: no Dizzy/lightheaded: no   PREDIABETES: A1C recently 5.7% in  February.  Had lost some weight last year, but has gained some back recently. Polydipsia/polyuria: no Visual disturbance: no Chest pain: no Paresthesias: no   GOUT Last uric acid level 7.6 in September. Taking Allopurinol 200 MG daily. Current flare to left ankle and foot area -- for one week.   Duration:chronic Right 1st metatarsophalangeal pain: no Left 1st metatarsophalangeal pain: yes Right knee pain: no Left knee pain: no Severity: moderate  Quality: dull and aching Swelling: no Redness: no Trauma: no Recent dietary change or indiscretion: shrimp and flounder Fevers: no Nausea/vomiting: no Aggravating factors: unknown Alleviating factors: Allopurinol Status:  stable Treatments attempted: Allopurinol  FATIGUE Wife reports he has been very lethargic recently, retired one month ago -- since retired he has been more fatigued.  Uses CPAP 100% of the time at home, sleeps well with CPAP.  Wife reports noticing some mild drooping of left side at times over past week, has history of TIA.  Neurology released him months back.  Occasional slurred speech -- when first wakes up or has not talked for awhile.  On 01/15/2019 == Multiple small chronic supratentorial and infratentorial lacunar infarcts.  Continues on ASA every day.  Has some mild left sided weakness reported at baseline. Duration:  weeks Severity: 7/10  Onset: gradual Context when symptoms started:  unknown Symptoms improve with rest: yes  Depressive symptoms: yes Stress/anxiety: no Insomnia: none Snoring: no Observed apnea by bed partner: no Daytime hypersomnolence:no Wakes feeling refreshed: no History of  sleep study: yes Dysnea on exertion:  no Orthopnea/PND: one pillow -- baseline -- no orthopnea Chest pain: no Chronic cough: no Lower extremity edema: no Arthralgias:yes Myalgias: no Weakness: no Rash: no  Depression screen [facility] 2/9 11/03/2020 01/17/2020 07/03/2019 01/25/2018 07/24/2017  Decreased Interest 1 0 0 0 0   Down, Depressed, Hopeless 1 0 0 0 0  PHQ - 2 Score 2 0 0 0 0  Altered sleeping 0 - - 0 0  Tired, decreased energy 2 - - 0 0  Change in appetite 0 - - 0 0  Feeling bad or failure about yourself  0 - - 0 0  Trouble concentrating 0 - - 0 0  Moving slowly or fidgety/restless 1 - - 0 0  Suicidal thoughts 0 - - 0 0  PHQ-9 Score 5 - - 0 0  Difficult doing work/chores Somewhat difficult - - - -    Relevant past medical, surgical, family and social history reviewed and updated as indicated. Interim medical history since our last visit reviewed. Allergies and medications reviewed and updated.  Review of Systems  Constitutional: Negative for activity change, diaphoresis, fatigue and fever.  Respiratory: Negative for cough, chest tightness, shortness of breath and wheezing.   Cardiovascular: Negative for chest pain, palpitations and leg swelling.  Gastrointestinal: Negative.   Endocrine: Negative for cold intolerance, heat intolerance, polydipsia, polyphagia and polyuria.  Musculoskeletal: Positive for arthralgias.  Neurological: Positive for facial asymmetry, speech difficulty and weakness. Negative for dizziness, tremors, syncope, light-headedness, numbness and headaches.  Psychiatric/Behavioral: Negative for decreased concentration, self-injury, sleep disturbance and suicidal ideas. The patient is not nervous/anxious.     Per HPI unless specifically indicated above     Objective:    BP 113/67   Pulse 66   Temp 98.7 F (37.1 C) (Oral)   Wt 296 lb 3.2 oz (134.4 kg)   SpO2 91%   BMI 41.31 kg/m   Wt Readings from Last 3 Encounters:  11/03/20 296 lb 3.2 oz (134.4 kg)  07/21/20 (!) 305 lb 9.6 oz (138.6 kg)  01/17/20 299 lb 6.4 oz (135.8 kg)    Physical Exam Vitals and nursing note reviewed.  Constitutional:      General: He is awake. He is not in acute distress.    Appearance: He is well-developed. He is obese. He is not ill-appearing.  HENT:     Head: Normocephalic and  atraumatic.     Right Ear: Hearing normal. No drainage.     Left Ear: Hearing normal. No drainage.  Eyes:     General: Lids are normal.        Right eye: No discharge.        Left eye: No discharge.     Extraocular Movements: Extraocular movements intact.     Conjunctiva/sclera: Conjunctivae normal.     Pupils: Pupils are equal, round, and reactive to light.     Visual Fields: Right eye visual fields normal and left eye visual fields normal.  Neck:     Thyroid: No thyromegaly.     Vascular: No carotid bruit.  Cardiovascular:     Rate and Rhythm: Normal rate and regular rhythm.     Heart sounds: Normal heart sounds, S1 normal and S2 normal. No murmur heard. No gallop.   Pulmonary:     Effort: Pulmonary effort is normal. No accessory muscle usage or respiratory distress.     Breath sounds: Normal breath sounds.  Abdominal:     General: Bowel sounds are normal.  Palpations: Abdomen is soft.  Musculoskeletal:        General: Normal range of motion.     Cervical back: Normal range of motion and neck supple.     Right lower leg: 1+ Edema present.     Left lower leg: 1+ Edema present.  Lymphadenopathy:     Cervical: No cervical adenopathy.  Skin:    General: Skin is warm and dry.  Neurological:     Mental Status: He is alert and oriented to person, place, and time.     Cranial Nerves: Facial asymmetry (slight downward droop noted to left side mouth ) present. No dysarthria.     Sensory: Sensation is intact.     Motor: Weakness (mild to left upper and lower extremities) present.     Coordination: Coordination is intact.     Gait: Gait is intact.     Deep Tendon Reflexes: Reflexes are normal and symmetric.     Reflex Scores:      Brachioradialis reflexes are 2+ on the right side and 2+ on the left side.      Patellar reflexes are 2+ on the right side and 2+ on the left side.    Comments: Walking with cane.  Normal speech, no slurring noted.  Psychiatric:        Attention and  Perception: Attention normal.        Mood and Affect: Affect is flat.        Speech: Speech normal.        Behavior: Behavior normal. Behavior is cooperative.        Thought Content: Thought content normal.     Comments: Not as talkative and jovial as baseline.    Results for orders placed or performed in visit on 07/21/20  Microalbumin, Urine Waived  Result Value Ref Range   Microalb, Ur Waived 30 (H) 0 - 19 mg/L   Creatinine, Urine Waived 300 10 - 300 mg/dL   Microalb/Creat Ratio <30 <30 mg/g  Lipid Panel w/o Chol/HDL Ratio  Result Value Ref Range   Cholesterol, Total 127 100 - 199 mg/dL   Triglycerides 137 0 - 149 mg/dL   HDL 40 >39 mg/dL   VLDL Cholesterol Cal 24 5 - 40 mg/dL   LDL Chol Calc (NIH) 63 0 - 99 mg/dL  TSH  Result Value Ref Range   TSH 2.910 0.450 - 4.500 uIU/mL  Comprehensive metabolic panel  Result Value Ref Range   Glucose 123 (H) 65 - 99 mg/dL   BUN 20 6 - 24 mg/dL   Creatinine, Ser 1.09 0.76 - 1.27 mg/dL   GFR calc non Af Amer 74 >59 mL/min/1.73   GFR calc Af Amer 85 >59 mL/min/1.73   BUN/Creatinine Ratio 18 9 - 20   Sodium 143 134 - 144 mmol/L   Potassium 4.0 3.5 - 5.2 mmol/L   Chloride 101 96 - 106 mmol/L   CO2 24 20 - 29 mmol/L   Calcium 9.2 8.7 - 10.2 mg/dL   Total Protein 7.2 6.0 - 8.5 g/dL   Albumin 4.3 3.8 - 4.9 g/dL   Globulin, Total 2.9 1.5 - 4.5 g/dL   Albumin/Globulin Ratio 1.5 1.2 - 2.2   Bilirubin Total 0.3 0.0 - 1.2 mg/dL   Alkaline Phosphatase 90 44 - 121 IU/L   AST 22 0 - 40 IU/L   ALT 27 0 - 44 IU/L  Uric acid  Result Value Ref Range   Uric Acid 7.6 3.8 - 8.4 mg/dL  HgB A1c  Result Value Ref Range   Hgb A1c MFr Bld 5.7 (H) 4.8 - 5.6 %   Est. average glucose Bld gHb Est-mCnc 117 mg/dL      Assessment & Plan:   Problem List Items Addressed This Visit      Cardiovascular and Mediastinum   Hypertension    Chronic, stable with home BP at goal in office and at home.  Continue current medication regimen and recommend DASH diet  at home, lowering sodium in diet to avoid fluid retention.  Adjust regimen as needed.  Continue to collaborate with cardiology as needed.  Labs today CMP, CBC, & TSH.  Will further consider discontinuation of Chlorthalidone if ongoing gout flares and possibly need to start Hydralazine or Clonidine -- avoid BB due to low resting HR.  Return to office in 1 week.      Relevant Orders   Comprehensive metabolic panel   TSH     Respiratory   Sleep apnea    Chronic, stable -- continue 100% use of CPAP at home.        Endocrine   IFG (impaired fasting glucose)    Ongoing and monitored -- diet focus, obtain A1C today.      Relevant Orders   Hemoglobin A1c     Musculoskeletal and Integument   Chronic idiopathic gout involving toe of left foot without tophus    Chronic, ongoing with recent increased flares.  Check uric acid level today and increase Allopurinol to 300 MG daily if elevated.  Colchicine for use during acute flares and Gabapentin for his chronic knee pain as needed. Consider discontinuation of Chlorthalidone if ongoing flares.  Return in one week.      Relevant Medications   colchicine 0.6 MG tablet   Other Relevant Orders   Uric acid     Other   Hyperlipidemia    Chronic, ongoing.  Continue current medication regimen and adjust as needed -- is on twice weekly Crestor due to myalgia with increased doses.  Will plan on repeat CMP today -- add on lipid next visit. Educated him on stroke prevention goals: BP <130/90 LDL <70 A1C <6.5      Morbid obesity ([place]) - Primary    BMI 41.31 today, is working on losing recent weight.  Recommended eating smaller high protein, low fat meals more frequently and exercising 30 mins a day 5 times a week with a goal of 10-15lb weight loss in the next 3 months. Patient voiced their understanding and motivation to adhere to these recommendations.       History of stroke    In August 2020, with current acute facial droop and slurred speech  episodes over past week reported by wife.  Obtain STAT MRI today and assess -- if any changes will send to ER for further evaluation, although past acute treatment window.  Also consider neurology return if changes noted on imaging.  Continue statin and ASA for prevention.  BP at goal today.  Check A1c, CMP, CBC.      Relevant Orders   MR Brain W Wo Contrast   Fatigue    Acute since retirement -- ?acute CVA event vs depression.  PHQ 9 = 5 today and he does endorse some depressed mood post retirement.  Refer to facial droop plan of care for further.  At this time will hold off on starting medication for mood unless MRI returns normal, then may consider.  Labs today.      Relevant  Orders   CBC with Differential/Platelet   Facial droop    Acute events noted by his wife over past week.  Obtain STAT MRI today and assess -- if any changes will send to ER for further evaluation, although past acute treatment window.  Also consider neurology return if changes noted on imaging.  Continue statin and ASA for prevention.  BP at goal today.  Check A1c, CMP, CBC.       Relevant Orders   MR Brain W Wo Contrast     Time: 30 minutes, >50% spent counseling/or care coordination    Follow up plan: Return in about 1 week (around 11/10/2020) for FATIGUE.

## 2020-11-03 NOTE — Assessment & Plan Note (Addendum)
BMI 41.31 today, is working on losing recent weight.  Recommended eating smaller high protein, low fat meals more frequently and exercising 30 mins a day 5 times a week with a goal of 10-15lb weight loss in the next 3 months. Patient voiced their understanding and motivation to adhere to these recommendations.

## 2020-11-03 NOTE — Assessment & Plan Note (Addendum)
Chronic, stable with home BP at goal in office and at home.  Continue current medication regimen and recommend DASH diet at home, lowering sodium in diet to avoid fluid retention.  Adjust regimen as needed.  Continue to collaborate with cardiology as needed.  Labs today CMP, CBC, & TSH.  Will further consider discontinuation of Chlorthalidone if ongoing gout flares and possibly need to start Hydralazine or Clonidine -- avoid BB due to low resting HR.  Return to office in 1 week.

## 2020-11-04 ENCOUNTER — Other Ambulatory Visit: Payer: Self-pay | Admitting: Nurse Practitioner

## 2020-11-04 LAB — CBC WITH DIFFERENTIAL/PLATELET
Basophils Absolute: 0.1 10*3/uL (ref 0.0–0.2)
Basos: 1 %
EOS (ABSOLUTE): 0.2 10*3/uL (ref 0.0–0.4)
Eos: 2 %
Hematocrit: 43.1 % (ref 37.5–51.0)
Hemoglobin: 14.5 g/dL (ref 13.0–17.7)
Immature Grans (Abs): 0 10*3/uL (ref 0.0–0.1)
Immature Granulocytes: 0 %
Lymphocytes Absolute: 1.1 10*3/uL (ref 0.7–3.1)
Lymphs: 12 %
MCH: 28.3 pg (ref 26.6–33.0)
MCHC: 33.6 g/dL (ref 31.5–35.7)
MCV: 84 fL (ref 79–97)
Monocytes Absolute: 0.4 10*3/uL (ref 0.1–0.9)
Monocytes: 5 %
Neutrophils Absolute: 7.1 10*3/uL — ABNORMAL HIGH (ref 1.4–7.0)
Neutrophils: 80 %
Platelets: 276 10*3/uL (ref 150–450)
RBC: 5.13 x10E6/uL (ref 4.14–5.80)
RDW: 13.6 % (ref 11.6–15.4)
WBC: 8.9 10*3/uL (ref 3.4–10.8)

## 2020-11-04 LAB — COMPREHENSIVE METABOLIC PANEL
ALT: 29 IU/L (ref 0–44)
AST: 27 IU/L (ref 0–40)
Albumin/Globulin Ratio: 1.2 (ref 1.2–2.2)
Albumin: 4.2 g/dL (ref 3.8–4.9)
Alkaline Phosphatase: 116 IU/L (ref 44–121)
BUN/Creatinine Ratio: 28 — ABNORMAL HIGH (ref 10–24)
BUN: 34 mg/dL — ABNORMAL HIGH (ref 8–27)
Bilirubin Total: 0.5 mg/dL (ref 0.0–1.2)
CO2: 22 mmol/L (ref 20–29)
Calcium: 9.3 mg/dL (ref 8.6–10.2)
Chloride: 98 mmol/L (ref 96–106)
Creatinine, Ser: 1.22 mg/dL (ref 0.76–1.27)
Globulin, Total: 3.5 g/dL (ref 1.5–4.5)
Glucose: 149 mg/dL — ABNORMAL HIGH (ref 65–99)
Potassium: 3.3 mmol/L — ABNORMAL LOW (ref 3.5–5.2)
Sodium: 141 mmol/L (ref 134–144)
Total Protein: 7.7 g/dL (ref 6.0–8.5)
eGFR: 68 mL/min/{1.73_m2} (ref >59)

## 2020-11-04 LAB — URIC ACID: Uric Acid: 10.9 mg/dL — ABNORMAL HIGH (ref 3.8–8.4)

## 2020-11-04 LAB — HEMOGLOBIN A1C
Est. average glucose Bld gHb Est-mCnc: 126 mg/dL
Hgb A1c MFr Bld: 6 % — ABNORMAL HIGH (ref 4.8–5.6)

## 2020-11-04 LAB — TSH: TSH: 2.04 u[IU]/mL (ref 0.450–4.500)

## 2020-11-04 MED ORDER — ONETOUCH VERIO W/DEVICE KIT: PACK | 0 refills | Status: DC

## 2020-11-04 MED ORDER — ONETOUCH ULTRASOFT LANCETS MISC: 12 refills | Status: DC

## 2020-11-04 MED ORDER — ALLOPURINOL 100 MG PO TABS: 300.0000 mg | ORAL_TABLET | Freq: Every day | ORAL | 4 refills | Status: DC

## 2020-11-04 MED ORDER — DEXAMETHASONE 4 MG PO TABS: 4.0000 mg | ORAL_TABLET | Freq: Three times a day (TID) | ORAL | 2 refills | Status: DC

## 2020-11-04 MED ORDER — GLUCOSE BLOOD VI STRP: ORAL_STRIP | 12 refills | Status: DC

## 2020-11-04 NOTE — Progress Notes (Signed)
Contacted via [place] evening Gerald Stabs, your labs have returned: - CBC shows no anemia - Glucose is mildly elevated at 149 and your A1c was 6%, I am concerned with oral steroid your sugars may increase so please notify me right away if when checking at home blood sugars are consistently greater then 130 in morning or greater then 180 after meal.  If this happens I will send in some Metformin to start to help control these.   - Your kidney function, creatinine and eGFR, and liver function, AST and ALT, are normal.   - Potassium mildly low at 3.3, please increase you potassium rich foods in diet to start -- avocado, mangoes, nuts.  I would like you to return to see me next week, please schedule this visit and we will recheck lab then. - Thyroid lab normal - Uric acid elevated at 10.9, lets increase your Allopurinol to 300 MG, I will send this in and recheck this next week.  Any questions?  Big hugs to you. Keep being awesome!!  Thank you for allowing me to participate in your care.  I appreciate you. Kindest regards, Atticus Wedin

## 2020-11-04 NOTE — Addendum Note (Signed)
Addended by: Marnee Guarneri T on: 11/04/2020 10:19 AM   Modules accepted: Orders

## 2020-11-04 NOTE — Progress Notes (Signed)
Dexamethasone order after discussion with oncology about next steps via secure chat.  He is scheduled to see oncology 10 am Friday and then neurosurgery June 7th.  Alerted patient that would send in glucometer supplies and to immediately alert provider if any elevation in BS >130 consistently as recent A1c 6% in prediabetic range.  May need to start Metformin if steroid elevates sugars.

## 2020-11-04 NOTE — Addendum Note (Signed)
Addended by: Marnee Guarneri T on: 11/04/2020 06:36 PM   Modules accepted: Orders

## 2020-11-06 ENCOUNTER — Other Ambulatory Visit: Payer: Self-pay | Admitting: Nurse Practitioner

## 2020-11-06 ENCOUNTER — Encounter: Payer: Self-pay | Admitting: Internal Medicine

## 2020-11-06 ENCOUNTER — Inpatient Hospital Stay: Payer: Commercial Managed Care - PPO | Attending: Internal Medicine | Admitting: Internal Medicine

## 2020-11-06 ENCOUNTER — Other Ambulatory Visit: Payer: Self-pay

## 2020-11-06 DIAGNOSIS — G9389 Other specified disorders of brain: Secondary | ICD-10-CM | POA: Diagnosis not present

## 2020-11-06 DIAGNOSIS — Z803 Family history of malignant neoplasm of breast: Secondary | ICD-10-CM | POA: Insufficient documentation

## 2020-11-06 DIAGNOSIS — Z801 Family history of malignant neoplasm of trachea, bronchus and lung: Secondary | ICD-10-CM | POA: Insufficient documentation

## 2020-11-06 DIAGNOSIS — Z808 Family history of malignant neoplasm of other organs or systems: Secondary | ICD-10-CM | POA: Diagnosis not present

## 2020-11-06 DIAGNOSIS — R29810 Facial weakness: Secondary | ICD-10-CM | POA: Insufficient documentation

## 2020-11-06 MED ORDER — DEXAMETHASONE 4 MG PO TABS: 4.0000 mg | ORAL_TABLET | Freq: Two times a day (BID) | ORAL | 2 refills | Status: DC

## 2020-11-06 NOTE — Progress Notes (Signed)
Wife says that he has some slurring on one side of face and was slurring words and though he had CVA and went to PCP and got rmi and was told brain mass. He is on decadron and has appt tues tiwht dr Lacinda Axon the surgeon

## 2020-11-06 NOTE — Progress Notes (Signed)
[facility] [place], [place] [address] [phone]   New Patient Evaluation  Date of Service: 11/06/20 Patient Name: Paul Cannon Patient MRN: [id number] Patient DOB: 08/18/1960 Provider: Ventura Sellers, MD  Identifying Statement:  Paul Cannon is a 60 y.o. male with right frontal mass who presents for initial consultation and evaluation.    Referring Provider: Venita Lick, NP [address] [place],  [place] [address]  Oncologic History: 11/03/20: Presents w/ behavioral changes, Brain MRI demonstrates hemorrhagic R frontal mass   Biomarkers:  MGMT Unknown.  IDH 1/2 Unknown.  EGFR Unknown  TERT Unknown   History of Present Illness: The patient's records from the referring physician were obtained and reviewed and the patient interviewed to confirm this HPI.  Paul Cannon presented to medical attention this past week with 1-2 weeks of change in personality.  Normally very outgoing and gregarious, wife noticed he had become more withdrawn, less interactive.  He has been sleeping 10-12 hours per day/night, which is also unusual for him.  Also appreciated was drooping of the left side of the face.  Otherwise denies headaches, seizures, weakness, numbness.  No other systemic symptoms which are of a concerning nature, aside from chronic gout associated joint pain.   Medications: Current Outpatient Medications on File Prior to Visit  Medication Sig Dispense Refill  . allopurinol (ZYLOPRIM) 100 MG tablet Take 3 tablets (300 mg total) by mouth daily. 270 tablet 4  . amLODipine (NORVASC) 10 MG tablet Take 1 tablet (10 mg total) by mouth daily. 90 tablet 4  . aspirin 81 MG chewable tablet Chew 81 mg by mouth daily.    . Blood Glucose Monitoring Suppl (ONETOUCH VERIO) w/Device KIT Use to check blood sugar 3 times a day and document results, bring to appointments.  Goal is <130 fasting blood sugar and <180 two hours  after meals.  If elevations please notify provider immediately. 1 kit 0  . chlorthalidone (HYGROTON) 25 MG tablet Take 1 tablet (25 mg total) by mouth daily. 90 tablet 4  . colchicine 0.6 MG tablet Take 1 tablet (0.6 mg total) by mouth daily. Use only when acute flare of gout present. 30 tablet 0  . cyclobenzaprine (FLEXERIL) 10 MG tablet TAKE 1 TABLET BY MOUTH AT BEDTIME AS NEEDED 30 tablet 1  . dexamethasone (DECADRON) 4 MG tablet Take 1 tablet (4 mg total) by mouth 3 (three) times daily. 90 tablet 2  . gabapentin (NEURONTIN) 300 MG capsule Take 1 capsule (300 mg total) by mouth daily. 90 capsule 3  . glucose blood test strip Use to check blood sugar 3 times a day and document results, bring to appointments.  Goal is <130 fasting blood sugar and <180 two hours after meals.  If elevations please notify provider immediately. 100 each 12  . Lancets (ONETOUCH ULTRASOFT) lancets Use to check blood sugar 3 times a day and document results, bring to appointments.  Goal is <130 fasting blood sugar and <180 two hours after meals.  If elevations please notify provider immediately. 100 each 12  . Multiple Vitamins-Minerals (ONE-A-DAY MENS 50+ ADVANTAGE PO) Take 1 tablet by mouth daily.    . rosuvastatin (CRESTOR) 40 MG tablet Take 1 tablet (40 mg total) by mouth 2 (two) times a week. 24 tablet 4  . telmisartan (MICARDIS) 80 MG tablet Take 1 tablet (80 mg total) by mouth daily. 90 tablet 4   No current facility-administered medications on file  prior to visit.    Allergies:  Allergies  Allergen Reactions  . Percocet [Oxycodone-Acetaminophen] Diarrhea and Nausea And Vomiting  . Vicodin [Hydrocodone-Acetaminophen] Diarrhea and Nausea And Vomiting   Past Medical History:  Past Medical History:  Diagnosis Date  . Brain mass   . Gout 2022  . Hyperlipidemia   . Hypertension   . Obesity   . Sleep apnea    Past Surgical History:  Past Surgical History:  Procedure Laterality Date  . COLONOSCOPY WITH  PROPOFOL N/A 11/17/2015   Procedure: COLONOSCOPY WITH PROPOFOL;  Surgeon: [name], MD;  Location: [facility] ENDOSCOPY;  Service: Endoscopy;  Laterality: N/A;  . SHOULDER SURGERY     Social History:  Social History   Socioeconomic History  . Marital status: Married    Spouse name: Not on file  . Number of children: Not on file  . Years of education: Not on file  . Highest education level: Not on file  Occupational History  . Not on file  Tobacco Use  . Smoking status: Former Smoker    Quit date: 10/01/2010    Years since quitting: 10.1  . Smokeless tobacco: Former Network engineer  . Vaping Use: Never used  Substance and Sexual Activity  . Alcohol use: Yes    Comment: pt states occasional or very rarely  . Drug use: No  . Sexual activity: Yes  Other Topics Concern  . Not on file  Social History Narrative  . Not on file   Social Determinants of Health   Financial Resource Strain: Not on file  Food Insecurity: Not on file  Transportation Needs: Not on file  Physical Activity: Not on file  Stress: Not on file  Social Connections: Not on file  Intimate Partner Violence: Not on file   Family History:  Family History  Problem Relation Age of Onset  . Arthritis Mother   . Hyperlipidemia Mother   . Hypertension Mother   . Migraines Mother   . Heart disease Father        MI  . Stroke Father   . Lung disease Father   . Cancer Sister        breast  . Cancer Brother        melanoma  . Stroke Sister     Review of Systems: Constitutional: Doesn't report fevers, chills or abnormal weight loss Eyes: Doesn't report blurriness of vision Ears, nose, mouth, throat, and face: Doesn't report sore throat Respiratory: Doesn't report cough, dyspnea or wheezes Cardiovascular: Doesn't report palpitation, chest discomfort  Gastrointestinal:  Doesn't report nausea, constipation, diarrhea GU: Doesn't report incontinence Skin: Doesn't report skin rashes Neurological: Per  HPI Musculoskeletal: Doesn't report joint pain Behavioral/Psych: Doesn't report anxiety  Physical Exam: There were no vitals filed for this visit. KPS: 90. General: Alert, cooperative, pleasant, in no acute distress Head: Normal EENT: No conjunctival injection or scleral icterus.  Lungs: Resp effort normal Cardiac: Regular rate Abdomen: Non-distended abdomen Skin: No rashes cyanosis or petechiae. Extremities: No clubbing or edema  Neurologic Exam: Mental Status: Awake, alert, attentive to examiner. Oriented to self and environment. Language is fluent with intact comprehension.  Noted elements of agnosia, abulia. Cranial Nerves: Visual acuity is grossly normal. Visual fields are full. Extra-ocular movements intact. No ptosis. Face is symmetric Motor: Tone and bulk are normal. Power is full in both arms and legs. Reflexes are symmetric, no pathologic reflexes present.  Sensory: Intact to light touch Gait: Normal.   Labs: I have reviewed  the data as listed    Component Value Date/Time   NA 141 11/03/2020 1435   K 3.3 (L) 11/03/2020 1435   CL 98 11/03/2020 1435   CO2 22 11/03/2020 1435   GLUCOSE 149 (H) 11/03/2020 1435   GLUCOSE 152 (H) 01/15/2019 0754   BUN 34 (H) 11/03/2020 1435   CREATININE 1.22 11/03/2020 1435   CALCIUM 9.3 11/03/2020 1435   PROT 7.7 11/03/2020 1435   ALBUMIN 4.2 11/03/2020 1435   AST 27 11/03/2020 1435   ALT 29 11/03/2020 1435   ALKPHOS 116 11/03/2020 1435   BILITOT 0.5 11/03/2020 1435   GFRNONAA 74 07/21/2020 0841   GFRAA 85 07/21/2020 0841   Lab Results  Component Value Date   WBC 8.9 11/03/2020   NEUTROABS 7.1 (H) 11/03/2020   HGB 14.5 11/03/2020   HCT 43.1 11/03/2020   MCV 84 11/03/2020   PLT 276 11/03/2020    Imaging:  MR Brain W Wo Contrast  Result Date: 11/03/2020 CLINICAL DATA:  TIA. Increased fatigue. Left facial droop and slurred speech. EXAM: MRI HEAD WITHOUT AND WITH CONTRAST TECHNIQUE: Multiplanar, multiecho pulse sequences of  the brain and surrounding structures were obtained without and with intravenous contrast. CONTRAST:  77m GADAVIST GADOBUTROL 1 MMOL/ML IV SOLN COMPARISON:  MRI head 01/15/2019 FINDINGS: Brain: Large mass lesion in the right frontal lobe measuring 5.5 x 7 cm. There is susceptibility and evidence of prior hemorrhage throughout the mass. The mass has central necrosis with peripheral T1 hyperintensity prior to contrast infusion compatible with methemoglobin from blood products. There is surrounding edema in the white matter. There appears to be mild enhancement around the periphery of the mass however enhancement is largely obscured by T1 hyperintensity prior to contrast infusion. There is mass-effect on the right frontal horn right lateral ventricle. There is 8 mm midline shift to the left. The mass shows diffuse restricted diffusion however some of this may be susceptibility from prior hemorrhage. Scattered small white matter hyperintensities in both cerebral hemispheres do not enhance and were present previously. Small chronic infarct left cerebellum Vascular: Normal arterial flow voids Skull and upper cervical spine: No focal skeletal lesion. Sinuses/Orbits: Paranasal sinuses clear.  Negative orbit Other: None IMPRESSION: Large hemorrhagic mass lesion right frontal lobe measuring approximately 5.5 x 7 cm. There is surrounding edema and mass-effect with 8 mm midline shift. Extensive T1 shortening in the mass due to prior hemorrhage makes evaluation for enhancement limited however there appears to be mild enhancement. 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 These results were called by telephone at the time of interpretation on 11/03/2020 at 6:38 pm to provider [facility], who verbally acknowledged these results. Electronically Signed   By: CFranchot GalloM.D.   On: 11/03/2020 18:39     Assessment/Plan Frontal mass of brain [G93.89]  We [facility]  He  presents today with clinical and radiographic syndrome consistent with right frontal hemorrhagic neoplasm.  Etiology is either primary, or less likely, secondary.  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    Facial weakness, and very subtle long tract signs on the left are appreciated today.  We recommended consultation with neurosurgery, which is currently scheduled for early next week (Lacinda Axon.  Hopefully conditions are appropriate for craniotomy/resection, or at least a heavy debulking given the high degree of tumor volume.  If biopsy only is appropriate, should consider systemic staging prior to surgery to rule out primary systemic neoplasm.  Decadron  can be decreased to 68m BID, this will of course be adjusted in the post-operative period.   Screening for potential clinical trials was performed and discussed using eligibility criteria for [facility], as well as national database available on Cdirectyarddecor.com    The patient is not a candidate for a research protocol at this time due to no suitable study identified.   We spent twenty additional minutes teaching regarding the natural history, biology, and historical experience in the treatment of brain tumors. We then discussed in detail the current recommendations for therapy focusing on the mode of administration, mechanism of action, anticipated toxicities, and quality of life issues associated with this plan. We also provided teaching sheets for the patient to take home as an additional resource.  All questions were answered. The patient knows to call the clinic with any problems, questions or concerns. No barriers to learning were detected.  The total time spent in the encounter was 60 minutes and more than 50% was on counseling and review of test results   ZVentura Sellers MD Medical Director of Neuro-Oncology [facility] [place]06/03/22  11:08 AM

## 2020-11-11 ENCOUNTER — Other Ambulatory Visit: Payer: Self-pay | Admitting: Neurosurgery

## 2020-11-11 ENCOUNTER — Other Ambulatory Visit: Payer: Self-pay

## 2020-11-11 DIAGNOSIS — G939 Disorder of brain, unspecified: Secondary | ICD-10-CM

## 2020-11-12 ENCOUNTER — Other Ambulatory Visit: Payer: Self-pay | Admitting: Radiation Therapy

## 2020-11-12 ENCOUNTER — Other Ambulatory Visit: Payer: Self-pay

## 2020-11-12 ENCOUNTER — Ambulatory Visit
Admission: RE | Admit: 2020-11-12 | Discharge: 2020-11-12 | Disposition: A | Discharge status: Home / Self Care | Payer: Commercial Managed Care - PPO | Source: Ambulatory Visit | Attending: Neurosurgery | Admitting: Neurosurgery

## 2020-11-12 DIAGNOSIS — G939 Disorder of brain, unspecified: Secondary | ICD-10-CM | POA: Insufficient documentation

## 2020-11-12 MED ORDER — IOHEXOL 350 MG/ML SOLN
80.0000 mL | Freq: Once | INTRAVENOUS | Status: AC | PRN
  Administered 2020-11-12: 80 mL via INTRAVENOUS

## 2020-11-13 ENCOUNTER — Ambulatory Visit (INDEPENDENT_AMBULATORY_CARE_PROVIDER_SITE_OTHER): Payer: Commercial Managed Care - PPO | Admitting: Nurse Practitioner

## 2020-11-13 ENCOUNTER — Encounter: Payer: Self-pay | Admitting: Nurse Practitioner

## 2020-11-13 ENCOUNTER — Telehealth: Payer: Self-pay | Admitting: Internal Medicine

## 2020-11-13 DIAGNOSIS — G9389 Other specified disorders of brain: Secondary | ICD-10-CM | POA: Diagnosis not present

## 2020-11-13 NOTE — Patient Instructions (Signed)
http://APA.org/depression-guideline"> https://clinicalkey.com"> http://point-of-care.elsevierperformancemanager.com/skills/"> http://point-of-care.elsevierperformancemanager.com">  Managing Depression, Adult Depression is a mental health condition that affects your thoughts, feelings, and actions. Being diagnosed with depression can bring you relief if you did not know why you have felt or behaved a certain way. It could also leave you feeling overwhelmed with uncertainty about your future. Preparing yourself tomanage your symptoms can help you feel more positive about your future. How to manage lifestyle changes Managing stress  Stress is your body's reaction to life changes and events, both good and bad. Stress can add to your feelings of depression. Learning to manage your stresscan help lessen your feelings of depression. Try some of the following approaches to reducing your stress (stress reduction techniques): Listen to music that you enjoy and that inspires you. Try using a meditation app or take a meditation class. Develop a practice that helps you connect with your spiritual self. Walk in nature, pray, or go to a place of worship. Do some deep breathing. To do this, inhale slowly through your nose. Pause at the top of your inhale for a few seconds and then exhale slowly, letting your muscles relax. Practice yoga to help relax and work your muscles. Choose a stress reduction technique that suits your lifestyle and personality. These techniques take time and practice to develop. Set aside 5-15 minutes a day to do them. Therapists can offer training in these techniques. Other things you can do to manage stress include: Keeping a stress diary. Knowing your limits and saying no when you think something is too much. Paying attention to how you react to certain situations. You may not be able to control everything, but you can change your reaction. Adding humor to your life by watching funny films  or TV shows. Making time for activities that you enjoy and that relax you.  Medicines Medicines, such as antidepressants, are often a part of treatment for depression. Talk with your pharmacist or health care provider about all the medicines, supplements, and herbal products that you take, their possible side effects, and what medicines and other products are safe to take together. Make sure to report any side effects you may have to your health care provider. Relationships Your health care provider may suggest family therapy, couples therapy, orindividual therapy as part of your treatment. How to recognize changes Everyone responds differently to treatment for depression. As you recover from depression, you may start to: Have more interest in doing activities. Feel less hopeless. Have more energy. Overeat less often, or have a better appetite. Have better mental focus. It is important to recognize if your depression is not getting better or is getting worse. The symptoms you had in the beginning may return, such as: Tiredness (fatigue) or low energy. Eating too much or too little. Sleeping too much or too little. Feeling restless, agitated, or hopeless. Trouble focusing or making decisions. Unexplained physical complaints. Feeling irritable, angry, or aggressive. If you or your family members notice these symptoms coming back, let yourhealth care provider know right away. Follow these instructions at home: Activity  Try to get some form of exercise each day, such as walking, biking, swimming, or lifting weights. Practice stress reduction techniques. Engage your mind by taking a class or doing some volunteer work.  Lifestyle Get the right amount and quality of sleep. Cut down on using caffeine, tobacco, alcohol, and other potentially harmful substances. Eat a healthy diet that includes plenty of vegetables, fruits, whole grains, low-fat dairy products, and lean protein. Do not   eat  a lot of foods that are high in solid fats, added sugars, or salt (sodium). General instructions Take over-the-counter and prescription medicines only as told by your health care provider. Keep all follow-up visits as told by your health care provider. This is important. Where to find support Talking to others  Friends and family members can be sources of support and guidance. Talk to trusted friends or family members about your condition. Explain your symptoms to them, and let them know that you are working with a health care provider to treat your depression. Tell friends and family members how they also can behelpful. Finances Find appropriate mental health providers that fit with your financial situation. Talk with your health care provider about options to get reduced prices on your medicines. Where to find more information You can find support in your area from: Anxiety and Depression Association of America (ADAA): www.adaa.org Mental Health America: www.mentalhealthamerica.net National Alliance on Mental Illness: www.nami.org Contact a health care provider if: You stop taking your antidepressant medicines, and you have any of these symptoms: Nausea. Headache. Light-headedness. Chills and body aches. Not being able to sleep (insomnia). You or your friends and family think your depression is getting worse. Get help right away if: You have thoughts of hurting yourself or others. If you ever feel like you may hurt yourself or others, or have thoughts about taking your own life, get help right away. Go to your nearest emergency department or: Call your local emergency services (911 in the [place] Call a suicide crisis helpline, such as the National Suicide Prevention Lifeline at 1-800-273-8255. This is open 24 hours a day in the [place] Text the Crisis Text Line at 741741 (in the [place] Summary If you are diagnosed with depression, preparing yourself to manage your symptoms is a good way  to feel positive about your future. Work with your health care provider on a management plan that includes stress reduction techniques, medicines (if applicable), therapy, and healthy lifestyle habits. Keep talking with your health care provider about how your treatment is working. If you have thoughts about taking your own life, call a suicide crisis helpline or text a crisis text line. This information is not intended to replace advice given to you by your health care provider. Make sure you discuss any questions you have with your healthcare provider. Document Revised: 04/03/2019 Document Reviewed: 04/03/2019 Elsevier Patient Education  2022 Elsevier Inc.  

## 2020-11-13 NOTE — Progress Notes (Signed)
BP (!) 165/77   Pulse (!) 57   Temp 98.5 F (36.9 C)   Ht 5' 10.71" (1.796 m)   Wt (!) 300 lb 2 oz (136.1 kg)   SpO2 95%   BMI 42.20 kg/m    Subjective:    Patient ID: Paul Cannon, male    DOB: 1961-02-20, 60 y.o.   MRN: [id number]  HPI: Paul Cannon is a 60 y.o. male  Chief Complaint  Patient presents with   Brain Tumor   BRAIN MASS Seen in office 11/03/20 for personality changes, facial droop, and fatigue.  Labs performed showing A1c 6%, uric acid 10.9 (acute gout treated), and K+ 3.3.  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  He was seen by oncology, Dr. Berle Mull, on 11/06/20 and then by neurosurgery, Dr. Lacinda Axon, on 11/10/20.  Neurosurgery performed further CT imaging to assess area to rule out hemorrhagic stroke vs mass.  He is to follow-up with both oncology and neurosurgery this next week.  Currently he is on Decadron for inflammation around mass area and ASA was discontinued.  His wife reports patient continues to "not be himself".  More lethargic and less talkative.  Talks to oncology on phone on Monday.    Is to be monitoring sugars at home due to Decadron and A1c recently 6%, is having trouble with machine. Aspirin: stopped recently Recurrent headaches: no Visual changes: no Palpitations: no Dyspnea: no Chest pain: no Lower extremity edema: no Dizzy/lightheaded: no   Relevant past medical, surgical, family and social history reviewed and updated as indicated. Interim medical history since our last visit reviewed. Allergies and medications reviewed and updated.  Review of Systems  Constitutional:  Negative for activity change, diaphoresis, fatigue and fever.  Respiratory:  Negative for cough, chest tightness, shortness of breath and wheezing.   Cardiovascular:  Negative for chest pain, palpitations and leg swelling.  Gastrointestinal: Negative.   Endocrine: Negative for cold intolerance, heat intolerance,  polydipsia, polyphagia and polyuria.  Neurological:  Positive for facial asymmetry, speech difficulty and weakness. Negative for dizziness, tremors, syncope, light-headedness, numbness and headaches.  Psychiatric/Behavioral:  Negative for decreased concentration, self-injury, sleep disturbance and suicidal ideas. The patient is not nervous/anxious.    Per HPI unless specifically indicated above     Objective:    BP (!) 165/77   Pulse (!) 57   Temp 98.5 F (36.9 C)   Ht 5' 10.71" (1.796 m)   Wt (!) 300 lb 2 oz (136.1 kg)   SpO2 95%   BMI 42.20 kg/m   Wt Readings from Last 3 Encounters:  11/13/20 (!) 300 lb 2 oz (136.1 kg)  11/03/20 296 lb 3.2 oz (134.4 kg)  07/21/20 (!) 305 lb 9.6 oz (138.6 kg)    Physical Exam Vitals and nursing note reviewed.  Constitutional:      General: He is awake. He is not in acute distress.    Appearance: He is well-developed. He is obese. He is not ill-appearing.  HENT:     Head: Normocephalic and atraumatic.     Right Ear: Hearing normal. No drainage.     Left Ear: Hearing normal. No drainage.  Eyes:     General: Lids are normal.        Right eye: No discharge.        Left eye: No discharge.     Extraocular Movements: Extraocular movements intact.     Conjunctiva/sclera: Conjunctivae normal.  Pupils: Pupils are equal, round, and reactive to light.     Visual Fields: Right eye visual fields normal and left eye visual fields normal.  Neck:     Thyroid: No thyromegaly.     Vascular: No carotid bruit.  Cardiovascular:     Rate and Rhythm: Normal rate and regular rhythm.     Heart sounds: Normal heart sounds, S1 normal and S2 normal. No murmur heard.   No gallop.  Pulmonary:     Effort: Pulmonary effort is normal. No accessory muscle usage or respiratory distress.     Breath sounds: Normal breath sounds.  Abdominal:     General: Bowel sounds are normal.     Palpations: Abdomen is soft.  Musculoskeletal:        General: Normal range of  motion.     Cervical back: Normal range of motion and neck supple.     Right lower leg: 1+ Edema present.     Left lower leg: 1+ Edema present.  Lymphadenopathy:     Cervical: No cervical adenopathy.  Skin:    General: Skin is warm and dry.  Neurological:     Mental Status: He is alert and oriented to person, place, and time.     Cranial Nerves: Facial asymmetry (slight downward droop noted to left side mouth ) present. No dysarthria.     Sensory: Sensation is intact.     Motor: Weakness (mild to left upper and lower extremities) present.     Coordination: Coordination is intact.     Gait: Gait is intact.     Deep Tendon Reflexes: Reflexes are normal and symmetric.     Reflex Scores:      Brachioradialis reflexes are 2+ on the right side and 2+ on the left side.      Patellar reflexes are 2+ on the right side and 2+ on the left side.    Comments: Normal speech, no slurring noted.  Psychiatric:        Attention and Perception: Attention normal.        Mood and Affect: Affect is flat.        Speech: Speech normal.        Behavior: Behavior normal. Behavior is cooperative.        Thought Content: Thought content normal.     Comments: Not as talkative and jovial as baseline.    Results for orders placed or performed in visit on 11/03/20  CBC with Differential/Platelet  Result Value Ref Range   WBC 8.9 3.4 - 10.8 x10E3/uL   RBC 5.13 4.14 - 5.80 x10E6/uL   Hemoglobin 14.5 13.0 - 17.7 g/dL   Hematocrit 43.1 37.5 - 51.0 %   MCV 84 79 - 97 fL   MCH 28.3 26.6 - 33.0 pg   MCHC 33.6 31.5 - 35.7 g/dL   RDW 13.6 11.6 - 15.4 %   Platelets 276 150 - 450 x10E3/uL   Neutrophils 80 Not Estab. %   Lymphs 12 Not Estab. %   Monocytes 5 Not Estab. %   Eos 2 Not Estab. %   Basos 1 Not Estab. %   Neutrophils Absolute 7.1 (H) 1.4 - 7.0 x10E3/uL   Lymphocytes Absolute 1.1 0.7 - 3.1 x10E3/uL   Monocytes Absolute 0.4 0.1 - 0.9 x10E3/uL   EOS (ABSOLUTE) 0.2 0.0 - 0.4 x10E3/uL   Basophils Absolute  0.1 0.0 - 0.2 x10E3/uL   Immature Granulocytes 0 Not Estab. %   Immature Grans (Abs) 0.0 0.0 -  0.1 x10E3/uL  Comprehensive metabolic panel  Result Value Ref Range   Glucose 149 (H) 65 - 99 mg/dL   BUN 34 (H) 8 - 27 mg/dL   Creatinine, Ser 1.22 0.76 - 1.27 mg/dL   eGFR 68 >59 mL/min/1.73   BUN/Creatinine Ratio 28 (H) 10 - 24   Sodium 141 134 - 144 mmol/L   Potassium 3.3 (L) 3.5 - 5.2 mmol/L   Chloride 98 96 - 106 mmol/L   CO2 22 20 - 29 mmol/L   Calcium 9.3 8.6 - 10.2 mg/dL   Total Protein 7.7 6.0 - 8.5 g/dL   Albumin 4.2 3.8 - 4.9 g/dL   Globulin, Total 3.5 1.5 - 4.5 g/dL   Albumin/Globulin Ratio 1.2 1.2 - 2.2   Bilirubin Total 0.5 0.0 - 1.2 mg/dL   Alkaline Phosphatase 116 44 - 121 IU/L   AST 27 0 - 40 IU/L   ALT 29 0 - 44 IU/L  TSH  Result Value Ref Range   TSH 2.040 0.450 - 4.500 uIU/mL  Hemoglobin A1c  Result Value Ref Range   Hgb A1c MFr Bld 6.0 (H) 4.8 - 5.6 %   Est. average glucose Bld gHb Est-mCnc 126 mg/dL  Uric acid  Result Value Ref Range   Uric Acid 10.9 (H) 3.8 - 8.4 mg/dL      Assessment & Plan:   Problem List Items Addressed This Visit       Other   Frontal mass of brain    Diagnosed 11/03/20, at this time continue Decadron and monitor BS at least 2-3 times a day -- will bring glucometer to next visit so provider can assist in using.  Continue to hold ASA.  Will continue to collaborate with oncology and neurosurgery -- recent notes reviewed.  Plan on follow-up in 3 weeks in office.  Will repeat labs at visit: CMP, CBC.          Follow up plan: Return in about 3 weeks (around 12/04/2020) for Brain Mass.

## 2020-11-13 NOTE — Telephone Encounter (Signed)
Sch per 6/10 sch msg, pt aware

## 2020-11-13 NOTE — Assessment & Plan Note (Signed)
Diagnosed 11/03/20, at this time continue Decadron and monitor BS at least 2-3 times a day -- will bring glucometer to next visit so provider can assist in using.  Continue to hold ASA.  Will continue to collaborate with oncology and neurosurgery -- recent notes reviewed.  Plan on follow-up in 3 weeks in office.  Will repeat labs at visit: CMP, CBC.

## 2020-11-15 ENCOUNTER — Other Ambulatory Visit: Payer: Self-pay | Admitting: Nurse Practitioner

## 2020-11-15 NOTE — Telephone Encounter (Signed)
Requested medication (s) are due for refill today: yes  Requested medication (s) are on the active medication list: yes  Last refill:  08/24/20  Future visit scheduled: yes  Notes to clinic:  med not delegated to NT to RF   Requested Prescriptions  Pending Prescriptions Disp Refills   cyclobenzaprine (FLEXERIL) 10 MG tablet [Pharmacy Med Name: CYCLOBENZAPRINE 10 MG TABLET] 30 tablet 1    Sig: TAKE 1 TABLET BY MOUTH EVERY DAY AT BEDTIME AS NEEDED      Not Delegated - Analgesics:  Muscle Relaxants Failed - 11/15/2020 11:23 AM      Failed - This refill cannot be delegated      Passed - Valid encounter within last 6 months    Recent Outpatient Visits           2 days ago Frontal mass of brain   [place], [place] T, NP   1 week ago Morbid obesity ([place])   [place], [place] T, NP   3 months ago Primary hypertension   [facility], Henrine Screws T, NP   10 months ago Routine general medical examination at a health care facility   [facility], [place] T, NP   1 year ago Morbid obesity ([place])   [place], Barbaraann Faster, NP       Future Appointments             In 2 weeks [place], Barbaraann Faster, NP [facility]   In 2 months [place], Barbaraann Faster, NP [facility]

## 2020-11-16 ENCOUNTER — Inpatient Hospital Stay: Payer: Commercial Managed Care - PPO | Attending: Internal Medicine | Admitting: Internal Medicine

## 2020-11-16 DIAGNOSIS — G9389 Other specified disorders of brain: Secondary | ICD-10-CM | POA: Diagnosis not present

## 2020-11-16 NOTE — Progress Notes (Signed)
I connected with Paul Cannon on 11/16/20 at 10:00 AM EDT by telephone visit and verified that I am speaking with the correct person using two identifiers.  I discussed the limitations, risks, security and privacy concerns of performing an evaluation and management service by telemedicine and the availability of in-person appointments. I also discussed with the patient that there may be a patient responsible charge related to this service. The patient expressed understanding and agreed to proceed.  Other persons participating in the visit and their role in the encounter:  n/a  Patient's location:  Home  Provider's location:  Office  Chief Complaint:  Frontal mass of brain - Plan: [place]  History of Present Ilness: Paul Cannon describes no change in clinical condition since our prior visit.  He underwent CT angio without complication.  Continues on the decadron 4mg  daily. Observations: Language and cognition at baseline Assessment and Plan: Frontal mass of brain - Plan: MR BRAIN W WO CONTRAST  Reviewed case in brain/spine tumor board this AM.  Still suspicion for underlying neoplasm vs hemorrhagic infarct.  Need to repeat brain MRI to re-assess parenchyma, underlying etiology.    Follow Up Instructions: Will re-eval after MRI, hopefully later this week.  Dr. Lacinda Axon also aware/following  I discussed the assessment and treatment plan with the patient.  The patient was provided an opportunity to ask questions and all were answered.  The patient agreed with the plan and demonstrated understanding of the instructions.    The patient was advised to call back or seek an in-person evaluation if the symptoms worsen or if the condition fails to improve as anticipated.  I provided 5-10 minutes of non-face-to-face time during this enocunter.  Ventura Sellers, MD   I provided 15 minutes of non face-to-face telephone visit time during this encounter, and > 50% was spent counseling  as documented under my assessment & plan.

## 2020-11-16 NOTE — Telephone Encounter (Signed)
Scheduled 7/1

## 2020-11-17 ENCOUNTER — Other Ambulatory Visit: Payer: Self-pay | Admitting: Radiation Therapy

## 2020-11-19 ENCOUNTER — Telehealth: Payer: Self-pay | Admitting: *Deleted

## 2020-11-19 NOTE — Telephone Encounter (Signed)
Incoming call from patient wife stating that Dr Mickeal Skinner wanted patient to have an MRI done, but when she called to schedule it, the soonest appointment they would give was for 11/28/20 which she feels is too long to wait for MRI. She is requesting that Dr Mickeal Skinner change the order to stat or call to get the MRI appointment moved up. Please advise

## 2020-11-23 NOTE — Telephone Encounter (Signed)
Call returned to patient wife and advised of doctor response, she states"alright, just wanted to be sure" and then she thanked medicine me for calling her back

## 2020-11-28 ENCOUNTER — Other Ambulatory Visit: Payer: Self-pay

## 2020-11-28 ENCOUNTER — Ambulatory Visit
Admission: RE | Admit: 2020-11-28 | Discharge: 2020-11-28 | Disposition: A | Discharge status: Home / Self Care | Payer: Commercial Managed Care - PPO | Source: Ambulatory Visit | Attending: Internal Medicine | Admitting: Internal Medicine

## 2020-11-28 DIAGNOSIS — G9389 Other specified disorders of brain: Secondary | ICD-10-CM | POA: Insufficient documentation

## 2020-11-28 MED ORDER — GADOBUTROL 1 MMOL/ML IV SOLN
10.0000 mL | Freq: Once | INTRAVENOUS | Status: AC | PRN
  Administered 2020-11-28: 10 mL via INTRAVENOUS

## 2020-11-30 ENCOUNTER — Inpatient Hospital Stay: Payer: Commercial Managed Care - PPO

## 2020-11-30 ENCOUNTER — Other Ambulatory Visit: Payer: Self-pay | Admitting: Nurse Practitioner

## 2020-12-04 ENCOUNTER — Ambulatory Visit: Payer: Commercial Managed Care - PPO | Admitting: Nurse Practitioner

## 2020-12-04 ENCOUNTER — Inpatient Hospital Stay: Payer: Commercial Managed Care - PPO | Attending: Internal Medicine | Admitting: Internal Medicine

## 2020-12-04 ENCOUNTER — Other Ambulatory Visit: Payer: Self-pay | Admitting: Nurse Practitioner

## 2020-12-04 VITALS — BP 124/70 | HR 54 | Temp 97.9°F | Resp 18 | Wt 309.0 lb

## 2020-12-04 DIAGNOSIS — I619 Nontraumatic intracerebral hemorrhage, unspecified: Secondary | ICD-10-CM | POA: Insufficient documentation

## 2020-12-04 DIAGNOSIS — Z8673 Personal history of transient ischemic attack (TIA), and cerebral infarction without residual deficits: Secondary | ICD-10-CM | POA: Diagnosis not present

## 2020-12-04 DIAGNOSIS — Z808 Family history of malignant neoplasm of other organs or systems: Secondary | ICD-10-CM | POA: Insufficient documentation

## 2020-12-04 DIAGNOSIS — G9389 Other specified disorders of brain: Secondary | ICD-10-CM | POA: Diagnosis not present

## 2020-12-04 DIAGNOSIS — Z87891 Personal history of nicotine dependence: Secondary | ICD-10-CM | POA: Insufficient documentation

## 2020-12-04 DIAGNOSIS — Z803 Family history of malignant neoplasm of breast: Secondary | ICD-10-CM | POA: Diagnosis not present

## 2020-12-04 NOTE — Progress Notes (Signed)
[facility] at [facility] [address]  [place], [place] [address] [phone]   New Patient Evaluation  Date of Service: 12/04/20 Patient Name: Paul Cannon Patient MRN: [id number] Patient DOB: Oct 22, 1960 Provider: Henreitta Leber, MD  Identifying Statement:  Paul Cannon is a 60 y.o. male with right frontal  mass    Oncologic History: 11/03/20: Presents w/ behavioral changes, Brain MRI demonstrates hemorrhagic R frontal mass   Interval History: Paul Cannon presents today for follow up after recent MRI brain.  He and his wife describe modest improvement in energy, engagement, motivation.  He still leaves the lawn unkempt and is less social that prior to these changes.  He is fully functional and independent around the home otherwise, he will be grilling for the family for 4th of July this weekend.  H+P (11/06/20) Patient presented to medical attention this past week with 1-2 weeks of change in personality.  Normally very outgoing and gregarious, wife noticed he had become more withdrawn, less interactive.  He has been sleeping 10-12 hours per day/night, which is also unusual for him.  Also appreciated was drooping of the left side of the face.  Otherwise denies headaches, seizures, weakness, numbness.  No other systemic symptoms which are of a concerning nature, aside from chronic gout associated joint pain.   Medications: Current Outpatient Medications on File Prior to Visit  Medication Sig Dispense Refill   allopurinol (ZYLOPRIM) 100 MG tablet Take 3 tablets (300 mg total) by mouth daily. 270 tablet 4   amLODipine (NORVASC) 10 MG tablet Take 1 tablet (10 mg total) by mouth daily. 90 tablet 4   Blood Glucose Monitoring Suppl (ONETOUCH VERIO REFLECT) w/Device KIT USE TO CHECK BLOOD SUGAR 3 TIMES A DAY AND DOCUMENT RESULTS, BRING TO APPOINTMENTS. GOAL IS <130 FASTING BLOOD SUGAR AND <180 TWO HOURS AFTER MEALS. IF ELEVATIONS PLEASE NOTIFY  PROVIDER IMMEDIATELY. 1 kit 0   chlorthalidone (HYGROTON) 25 MG tablet Take 1 tablet (25 mg total) by mouth daily. 90 tablet 4   colchicine 0.6 MG tablet Take 1 tablet (0.6 mg total) by mouth daily. Use only when acute flare of gout present. 30 tablet 0   cyclobenzaprine (FLEXERIL) 10 MG tablet TAKE 1 TABLET BY MOUTH EVERY DAY AT BEDTIME AS NEEDED 30 tablet 1   dexamethasone (DECADRON) 4 MG tablet Take 1 tablet (4 mg total) by mouth 2 (two) times daily. 90 tablet 2   gabapentin (NEURONTIN) 300 MG capsule Take 1 capsule (300 mg total) by mouth daily. 90 capsule 3   glucose blood test strip Use to check blood sugar 3 times a day and document results, bring to appointments.  Goal is <130 fasting blood sugar and <180 two hours after meals.  If elevations please notify provider immediately. 100 each 12   Lancets (ONETOUCH ULTRASOFT) lancets Use to check blood sugar 3 times a day and document results, bring to appointments.  Goal is <130 fasting blood sugar and <180 two hours after meals.  If elevations please notify provider immediately. 100 each 12   Multiple Vitamins-Minerals (ONE-A-DAY MENS 50+ ADVANTAGE PO) Take 1 tablet by mouth daily.     rosuvastatin (CRESTOR) 40 MG tablet Take 1 tablet (40 mg total) by mouth 2 (two) times a week. 24 tablet 4   telmisartan (MICARDIS) 80 MG tablet Take 1 tablet (80 mg total) by mouth daily. 90 tablet 4   No current facility-administered medications on file prior to visit.    Allergies:  Allergies  Allergen Reactions   Percocet [Oxycodone-Acetaminophen] Diarrhea and Nausea And Vomiting   Vicodin [Hydrocodone-Acetaminophen] Diarrhea and Nausea And Vomiting   Past Medical History:  Past Medical History:  Diagnosis Date   Brain mass    Gout 2022   Hyperlipidemia    Hypertension    Obesity    Sleep apnea    Past Surgical History:  Past Surgical History:  Procedure Laterality Date   COLONOSCOPY WITH PROPOFOL N/A 11/17/2015   Procedure: COLONOSCOPY WITH  PROPOFOL;  Surgeon: [name], MD;  Location: [facility] ENDOSCOPY;  Service: Endoscopy;  Laterality: N/A;   SHOULDER SURGERY     Social History:  Social History   Socioeconomic History   Marital status: Married    Spouse name: Not on file   Number of children: Not on file   Years of education: Not on file   Highest education level: Not on file  Occupational History   Not on file  Tobacco Use   Smoking status: Former    Pack years: 0.00    Types: Cigarettes    Quit date: 10/01/2010    Years since quitting: 10.1   Smokeless tobacco: Former  Scientific laboratory technician Use: Never used  Substance and Sexual Activity   Alcohol use: Yes    Comment: pt states occasional or very rarely   Drug use: No   Sexual activity: Yes  Other Topics Concern   Not on file  Social History Narrative   Not on file   Social Determinants of Health   Financial Resource Strain: Not on file  Food Insecurity: Not on file  Transportation Needs: Not on file  Physical Activity: Not on file  Stress: Not on file  Social Connections: Not on file  Intimate Partner Violence: Not on file   Family History:  Family History  Problem Relation Age of Onset   Arthritis Mother    Hyperlipidemia Mother    Hypertension Mother    Migraines Mother    Heart disease Father        MI   Stroke Father    Lung disease Father    Cancer Sister        breast   Cancer Brother        melanoma   Stroke Sister     Review of Systems: Constitutional: Doesn't report fevers, chills or abnormal weight loss Eyes: Doesn't report blurriness of vision Ears, nose, mouth, throat, and face: Doesn't report sore throat Respiratory: Doesn't report cough, dyspnea or wheezes Cardiovascular: Doesn't report palpitation, chest discomfort  Gastrointestinal:  Doesn't report nausea, constipation, diarrhea GU: Doesn't report incontinence Skin: Doesn't report skin rashes Neurological: Per HPI Musculoskeletal: Doesn't report joint  pain Behavioral/Psych: Doesn't report anxiety  Physical Exam: Vitals:   12/04/20 1115  BP: 124/70  Pulse: (!) 54  Resp: 18  Temp: 97.9 F (36.6 C)  SpO2: 95%   KPS: 90. General: Alert, cooperative, pleasant, in no acute distress Head: Normal EENT: No conjunctival injection or scleral icterus.  Lungs: Resp effort normal Cardiac: Regular rate Abdomen: Non-distended abdomen Skin: No rashes cyanosis or petechiae. Extremities: No clubbing or edema  Neurologic Exam: Mental Status: Awake, alert, attentive to examiner. Oriented to self and environment. Language is fluent with intact comprehension.  Noted elements of agnosia, abulia. Cranial Nerves: Visual acuity is grossly normal. Visual fields are full. Extra-ocular movements intact. No ptosis. Face is symmetric Motor: Tone and bulk are normal. Power is full in both arms and legs. Reflexes are  symmetric, no pathologic reflexes present.  Sensory: Intact to light touch Gait: Normal.   Labs: I have reviewed the data as listed    Component Value Date/Time   NA 141 11/03/2020 1435   K 3.3 (L) 11/03/2020 1435   CL 98 11/03/2020 1435   CO2 22 11/03/2020 1435   GLUCOSE 149 (H) 11/03/2020 1435   GLUCOSE 152 (H) 01/15/2019 0754   BUN 34 (H) 11/03/2020 1435   CREATININE 1.22 11/03/2020 1435   CALCIUM 9.3 11/03/2020 1435   PROT 7.7 11/03/2020 1435   ALBUMIN 4.2 11/03/2020 1435   AST 27 11/03/2020 1435   ALT 29 11/03/2020 1435   ALKPHOS 116 11/03/2020 1435   BILITOT 0.5 11/03/2020 1435   GFRNONAA 74 07/21/2020 0841   GFRAA 85 07/21/2020 0841   Lab Results  Component Value Date   WBC 8.9 11/03/2020   NEUTROABS 7.1 (H) 11/03/2020   HGB 14.5 11/03/2020   HCT 43.1 11/03/2020   MCV 84 11/03/2020   PLT 276 11/03/2020    Imaging:  CT ANGIO HEAD W OR WO CONTRAST  Result Date: 11/12/2020 CLINICAL DATA:  Brain lesion. EXAM: CT ANGIOGRAPHY HEAD AND NECK TECHNIQUE: Multidetector CT imaging of the head and neck was performed using  the standard protocol during bolus administration of intravenous contrast. Multiplanar CT image reconstructions and MIPs were obtained to evaluate the vascular anatomy. Carotid stenosis measurements (when applicable) are obtained utilizing NASCET criteria, using the distal internal carotid diameter as the denominator. CONTRAST:  68mL OMNIPAQUE IOHEXOL 350 MG/ML SOLN COMPARISON:  MRI Nov 03, 2020.  CTA January 15, 2019 FINDINGS: CT HEAD FINDINGS Brain: Evolving acute/recent hemorrhage and edema in the frontal lobe, with suspected underlying mass better characterized on recent MRI from Nov 03, 2020. Mass effect appears improved relative to the MRI with approximately 4 mm of leftward midline shift near the foramina [place] and partial effacement of the right lateral ventricle. No hydrocephalus. No new/interval acute large vascular territory infarct. Basal cisterns are patent. Vascular: See below. Skull: No acute fracture. Sinuses: Visualized sinuses are clear. Orbits: Visualized orbits are unremarkable. Review of the MIP images confirms the above findings CTA NECK FINDINGS Aortic arch: Atherosclerosis.  Great vessel origins are patent. Right carotid system: Moderate predominately calcific atherosclerosis at the carotid bifurcation without greater than 50% narrowing. Left carotid system: Moderate predominately calcific atherosclerosis at the carotid bifurcation without greater than 50% narrowing. Vertebral arteries: Approximately 40% stenosis of the left vertebral artery origin. Otherwise, no significant stenosis of the vertebral arteries, which are Co dominant. Skeleton: Mild multilevel degenerative change of the cervical spine. Other neck: Approximately 2.0 cm right submandibular mass and 2.2 cm left inferior thyroidal mass, not substantially changed from 2020. Upper chest: Visualized lung apices are clear. Review of the MIP images confirms the above findings CTA HEAD FINDINGS Anterior circulation: Calcific  atherosclerosis of bilateral cavernous paraclinoid ICAs. No large vessel occlusion or flow limiting proximal stenosis. No visible vascular malformation in the region of the right frontal mass, although acute blood products limits evaluation. Paucity of vessels in the right frontal lobe is likely secondary to mass effect. No visible aneurysm. Posterior circulation: Bilateral intradural vertebral arteries, basilar artery and posterior cerebral arteries are patent without evidence of proximal hemodynamically significant stenosis. Right fetal like PCA, similar to prior. No visible aneurysm. Venous sinuses: As permitted by contrast timing, patent. Review of the MIP images confirms the above findings IMPRESSION: CT head: Evolving acute/recent hemorrhage and edema in the frontal lobe with suspected underlying  mass better characterized on recent MRI from Nov 03, 2020. Mass effect appears improved relative to the MRI with approximately 4 mm of leftward midline shift near the foramen of [place] and partial effacement of the right lateral ventricle. CTA Head: 1. No large vessel occlusion or proximal hemodynamically significant stenosis. 2. No visible vascular malformation in the region of the right frontal hemorrhage, although acute blood products limit evaluation. CTA Neck 1. Approximately 40% stenosis of the left vertebral artery origin. 2. Bilateral calcific atherosclerosis at the carotid bifurcations without greater than 50% narrowing. 3. Approximately 2.0 cm right submandibular mass and 2.2 cm left inferior thyroidal mass, not substantially changed from 2020. Electronically Signed   By: Margaretha Sheffield MD   On: 11/12/2020 11:07   CT ANGIO NECK W OR WO CONTRAST  Result Date: 11/12/2020 CLINICAL DATA:  Brain lesion. EXAM: CT ANGIOGRAPHY HEAD AND NECK TECHNIQUE: Multidetector CT imaging of the head and neck was performed using the standard protocol during bolus administration of intravenous contrast. Multiplanar CT image  reconstructions and MIPs were obtained to evaluate the vascular anatomy. Carotid stenosis measurements (when applicable) are obtained utilizing NASCET criteria, using the distal internal carotid diameter as the denominator. CONTRAST:  54mL OMNIPAQUE IOHEXOL 350 MG/ML SOLN COMPARISON:  MRI Nov 03, 2020.  CTA January 15, 2019 FINDINGS: CT HEAD FINDINGS Brain: Evolving acute/recent hemorrhage and edema in the frontal lobe, with suspected underlying mass better characterized on recent MRI from Nov 03, 2020. Mass effect appears improved relative to the MRI with approximately 4 mm of leftward midline shift near the foramina [place] and partial effacement of the right lateral ventricle. No hydrocephalus. No new/interval acute large vascular territory infarct. Basal cisterns are patent. Vascular: See below. Skull: No acute fracture. Sinuses: Visualized sinuses are clear. Orbits: Visualized orbits are unremarkable. Review of the MIP images confirms the above findings CTA NECK FINDINGS Aortic arch: Atherosclerosis.  Great vessel origins are patent. Right carotid system: Moderate predominately calcific atherosclerosis at the carotid bifurcation without greater than 50% narrowing. Left carotid system: Moderate predominately calcific atherosclerosis at the carotid bifurcation without greater than 50% narrowing. Vertebral arteries: Approximately 40% stenosis of the left vertebral artery origin. Otherwise, no significant stenosis of the vertebral arteries, which are Co dominant. Skeleton: Mild multilevel degenerative change of the cervical spine. Other neck: Approximately 2.0 cm right submandibular mass and 2.2 cm left inferior thyroidal mass, not substantially changed from 2020. Upper chest: Visualized lung apices are clear. Review of the MIP images confirms the above findings CTA HEAD FINDINGS Anterior circulation: Calcific atherosclerosis of bilateral cavernous paraclinoid ICAs. No large vessel occlusion or flow limiting proximal  stenosis. No visible vascular malformation in the region of the right frontal mass, although acute blood products limits evaluation. Paucity of vessels in the right frontal lobe is likely secondary to mass effect. No visible aneurysm. Posterior circulation: Bilateral intradural vertebral arteries, basilar artery and posterior cerebral arteries are patent without evidence of proximal hemodynamically significant stenosis. Right fetal like PCA, similar to prior. No visible aneurysm. Venous sinuses: As permitted by contrast timing, patent. Review of the MIP images confirms the above findings IMPRESSION: CT head: Evolving acute/recent hemorrhage and edema in the frontal lobe with suspected underlying mass better characterized on recent MRI from Nov 03, 2020. Mass effect appears improved relative to the MRI with approximately 4 mm of leftward midline shift near the foramen of [place] and partial effacement of the right lateral ventricle. CTA Head: 1. No large vessel occlusion or proximal hemodynamically  significant stenosis. 2. No visible vascular malformation in the region of the right frontal hemorrhage, although acute blood products limit evaluation. CTA Neck 1. Approximately 40% stenosis of the left vertebral artery origin. 2. Bilateral calcific atherosclerosis at the carotid bifurcations without greater than 50% narrowing. 3. Approximately 2.0 cm right submandibular mass and 2.2 cm left inferior thyroidal mass, not substantially changed from 2020. Electronically Signed   By: Margaretha Sheffield MD   On: 11/12/2020 11:07   MR BRAIN W WO CONTRAST  Result Date: 11/29/2020 CLINICAL DATA:  Concern for brain/CNS neoplasm.  Hemorrhage. EXAM: MRI HEAD WITHOUT AND WITH CONTRAST TECHNIQUE: Multiplanar, multiecho pulse sequences of the brain and surrounding structures were obtained without and with intravenous contrast. CONTRAST:  31mL GADAVIST GADOBUTROL 1 MMOL/ML IV SOLN COMPARISON:  MRI Nov 03, 2020. FINDINGS: Brain:  Decreased size of intraparenchymal hemorrhage in the right frontal lobe which demonstrates extensive T1 hyperintensity and susceptibility artifact with restricted diffusion. The hemorrhage measures approximately 5.3 x 3.7 cm cm (previously 6.8 x 4.9 cm when remeasured). The hemorrhage is intrinsically T1 hyperintense which limits assessment for enhancement; however, no convincing enhancement is seen. Decreasing surrounding edema and mass effect. No significant midline shift. Improved effacement of the right lateral ventricle. No hydrocephalus. Similar additional scattered T2/FLAIR hyperintensities within the white matter, suggestive of chronic microvascular ischemic change. 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 Remote left caudate and bilateral thalamic lacunar infarct. No acute infarct. Vascular: Major arterial flow voids are maintained at the skull base. Skull and upper cervical spine: Normal marrow signal. Sinuses/Orbits: Clear sinuses.  Unremarkable orbits. Other: No mastoid effusions. IMPRESSION: 1. Decreased size of an intraparenchymal hemorrhage in the right frontal lobe with decreasing surrounding edema and mass effect.The hemorrhage is intrinsically T1 hyperintense which limits assessment for enhancement; however, no convincing enhancement is seen to suggest an underlying mass. While this is favored to represent a resolving intraparenchymal hematoma, a follow-up MRI with contrast in approximately 3 months is recommended to exclude an underlying mass. 2. 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 Electronically Signed   By: Margaretha Sheffield MD   On: 11/29/2020 21:34     Assessment/Plan Intraparenchymal hemorrhage of brain [facility])  Dimas [place] is clinically improved today.  MRI is now more strongly suggestive of intraparenchymal hemorrhage rather than hemorrhagic neoplasm.    We recommended repeating study in 3 months to complete  process of ruling out underlying tumor.  For stroke, etiology is either hypertensive hemorrhage or hemorrhagic conversion of ischemic infarct.  CT angio head and neck were reviewed.  We would like him to obtain an echocardiogram to complete the stroke workup.    We also counseled him on secondary prevention and ischemic and hemorrhagic stroke risk factors.  Decadron should decrease to $RemoveBef'4mg'[id number]$  x7 days, then $RemoveBe'2mg'rqjnEKAif$  x7 days, then stop if tolerated.   All questions were answered. The patient knows to call the clinic with any problems, questions or concerns. No barriers to learning were detected.  The total time spent in the encounter was 40 minutes and more than 50% was on counseling and review of test results   Ventura Sellers, MD Medical Director of Neuro-Oncology [facility] 12/04/20 11:10 AM

## 2020-12-08 ENCOUNTER — Encounter: Payer: Self-pay | Admitting: Nurse Practitioner

## 2020-12-08 ENCOUNTER — Ambulatory Visit (INDEPENDENT_AMBULATORY_CARE_PROVIDER_SITE_OTHER): Payer: Commercial Managed Care - PPO | Admitting: Nurse Practitioner

## 2020-12-08 ENCOUNTER — Other Ambulatory Visit: Payer: Self-pay

## 2020-12-08 VITALS — BP 113/68 | HR 82 | Temp 100.6°F | Wt 306.0 lb

## 2020-12-08 DIAGNOSIS — R509 Fever, unspecified: Secondary | ICD-10-CM | POA: Diagnosis not present

## 2020-12-08 DIAGNOSIS — I619 Nontraumatic intracerebral hemorrhage, unspecified: Secondary | ICD-10-CM

## 2020-12-08 NOTE — Assessment & Plan Note (Signed)
Noted on 11/28/20 -- continue collaboration with oncology and neurosurgery, may benefit referral to neurology in future.  Recent notes reviewed.  Scheduled for repeat MRI 03/05/21, will follow-up with him after this.  Continue off ASA at this time.  If any acute symptoms present immediately notify provider or go to ER.

## 2020-12-08 NOTE — Assessment & Plan Note (Signed)
Acute x 2 days of symptoms.  Will obtain Covid testing today and recommend self quarantine until results return.  Discussed oral treatment options if Covid present.  At this time continue OTC simple treatment, including Coricidin as needed for congestion of cough.  Ensure plenty of rest and hydration at home.  Return for worsening or ongoing symptoms.

## 2020-12-08 NOTE — Progress Notes (Signed)
BP 113/68   Pulse 82   Temp (!) 100.6 F (38.1 C) (Oral)   Wt (!) 306 lb (138.8 kg)   SpO2 92%   BMI 43.03 kg/m    Subjective:    Patient ID: Paul Cannon, male    DOB: Dec 16, 1960, 60 y.o.   MRN: [id number]  HPI: Paul Cannon is a 60 y.o. male  Chief Complaint  Patient presents with   Brain Tumor    Patient states he met with Oncologist and states that they informed him that they wanted to wait another 3 months before they would perform another MRI. Patient states over the last couple of days he has developed some congestion and coughing up phlegm.    BRAIN MASS Seen in office 11/03/20 for personality changes, facial droop, and fatigue.  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  He was seen by oncology, Dr. Berle Mull, on 11/06/20 and then by neurosurgery, Dr. Lacinda Axon, on 11/10/20.  Neurosurgery performed further CT imaging to assess area to rule out hemorrhagic stroke vs mass.  He followed-up with oncology 12/04/20 -- they reviewed recent imaging with him which is leaning more towards bleed (stroke) vs mass.  Currently he is reducing to discontinuation of Decadron.  Repeat MRI scheduled for 03/05/21, then sees oncology 03/12/21.   He reports still not feeling 100% himself.  He denies slurred speech or facial drooping.  Is to be monitoring sugars at home due to Decadron and A1c recently 6%, is continuing to have issues with machine. Aspirin: stopped recently Recurrent headaches: no Visual changes: no Palpitations: no Dyspnea: no Chest pain: no Lower extremity edema: no Dizzy/lightheaded: no   UPPER RESPIRATORY TRACT INFECTION Congestion past two days and some phlegm.  He reports he has been feeling well overall, fever in office today. Fever: no Cough: yes Shortness of breath: no Wheezing: no Chest pain: none Chest tightness: no Chest congestion: no Nasal congestion: no Runny nose: no Post nasal drip: yes Sneezing: no Sore  throat: no Swollen glands: no Sinus pressure: no Headache: no Face pain: no Toothache: no Ear pain: none Ear pressure: none Eyes red/itching:no Eye drainage/crusting: no  Vomiting: no Rash: no Fatigue: no Sick contacts: no Strep contacts: no  Context: stable Recurrent sinusitis: no Relief with OTC cold/cough medications: no  Treatments attempted: none    Relevant past medical, surgical, family and social history reviewed and updated as indicated. Interim medical history since our last visit reviewed. Allergies and medications reviewed and updated.  Review of Systems  Constitutional:  Positive for fatigue. Negative for activity change, diaphoresis and fever.  Respiratory:  Negative for cough, chest tightness, shortness of breath and wheezing.   Cardiovascular:  Negative for chest pain, palpitations and leg swelling.  Gastrointestinal: Negative.   Endocrine: Negative for cold intolerance, heat intolerance, polydipsia, polyphagia and polyuria.  Neurological:  Positive for weakness. Negative for dizziness, tremors, syncope, facial asymmetry, speech difficulty, light-headedness, numbness and headaches.  Psychiatric/Behavioral:  Negative for decreased concentration, self-injury, sleep disturbance and suicidal ideas. The patient is not nervous/anxious.    Per HPI unless specifically indicated above     Objective:    BP 113/68   Pulse 82   Temp (!) 100.6 F (38.1 C) (Oral)   Wt (!) 306 lb (138.8 kg)   SpO2 92%   BMI 43.03 kg/m   Wt Readings from Last 3 Encounters:  12/08/20 (!) 306 lb (138.8 kg)  12/04/20 (!) 309 lb (  140.2 kg)  11/13/20 (!) 300 lb 2 oz (136.1 kg)    Physical Exam Vitals and nursing note reviewed.  Constitutional:      General: He is awake. He is not in acute distress.    Appearance: He is well-developed. He is obese. He is not ill-appearing.  HENT:     Head: Normocephalic and atraumatic.     Right Ear: Hearing normal. No drainage.     Left Ear:  Hearing normal. No drainage.  Eyes:     General: Lids are normal.        Right eye: No discharge.        Left eye: No discharge.     Extraocular Movements: Extraocular movements intact.     Conjunctiva/sclera: Conjunctivae normal.     Pupils: Pupils are equal, round, and reactive to light.     Visual Fields: Right eye visual fields normal and left eye visual fields normal.  Neck:     Thyroid: No thyromegaly.     Vascular: No carotid bruit.  Cardiovascular:     Rate and Rhythm: Normal rate and regular rhythm.     Heart sounds: Normal heart sounds, S1 normal and S2 normal. No murmur heard.   No gallop.  Pulmonary:     Effort: Pulmonary effort is normal. No accessory muscle usage or respiratory distress.     Breath sounds: Normal breath sounds.  Abdominal:     General: Bowel sounds are normal.     Palpations: Abdomen is soft.  Musculoskeletal:        General: Normal range of motion.     Cervical back: Normal range of motion and neck supple.     Right lower leg: 1+ Edema present.     Left lower leg: 1+ Edema present.  Lymphadenopathy:     Cervical: No cervical adenopathy.  Skin:    General: Skin is warm and dry.  Neurological:     Mental Status: He is alert and oriented to person, place, and time.     Cranial Nerves: No dysarthria or facial asymmetry (none noted today).     Sensory: Sensation is intact.     Motor: Weakness (mild to left upper and lower extremities) present.     Coordination: Coordination is intact.     Gait: Gait is intact.     Deep Tendon Reflexes: Reflexes are normal and symmetric.     Reflex Scores:      Brachioradialis reflexes are 2+ on the right side and 2+ on the left side.      Patellar reflexes are 2+ on the right side and 2+ on the left side.    Comments: Normal speech, no slurring noted.  Psychiatric:        Attention and Perception: Attention normal.        Mood and Affect: Affect is flat.        Speech: Speech normal.        Behavior: Behavior  normal. Behavior is cooperative.        Thought Content: Thought content normal.     Comments: More talkative at visit today.    Results for orders placed or performed in visit on 11/03/20  CBC with Differential/Platelet  Result Value Ref Range   WBC 8.9 3.4 - 10.8 x10E3/uL   RBC 5.13 4.14 - 5.80 x10E6/uL   Hemoglobin 14.5 13.0 - 17.7 g/dL   Hematocrit 43.1 37.5 - 51.0 %   MCV 84 79 - 97 fL   MCH  28.3 26.6 - 33.0 pg   MCHC 33.6 31.5 - 35.7 g/dL   RDW 13.6 11.6 - 15.4 %   Platelets 276 150 - 450 x10E3/uL   Neutrophils 80 Not Estab. %   Lymphs 12 Not Estab. %   Monocytes 5 Not Estab. %   Eos 2 Not Estab. %   Basos 1 Not Estab. %   Neutrophils Absolute 7.1 (H) 1.4 - 7.0 x10E3/uL   Lymphocytes Absolute 1.1 0.7 - 3.1 x10E3/uL   Monocytes Absolute 0.4 0.1 - 0.9 x10E3/uL   EOS (ABSOLUTE) 0.2 0.0 - 0.4 x10E3/uL   Basophils Absolute 0.1 0.0 - 0.2 x10E3/uL   Immature Granulocytes 0 Not Estab. %   Immature Grans (Abs) 0.0 0.0 - 0.1 x10E3/uL  Comprehensive metabolic panel  Result Value Ref Range   Glucose 149 (H) 65 - 99 mg/dL   BUN 34 (H) 8 - 27 mg/dL   Creatinine, Ser 1.22 0.76 - 1.27 mg/dL   eGFR 68 >59 mL/min/1.73   BUN/Creatinine Ratio 28 (H) 10 - 24   Sodium 141 134 - 144 mmol/L   Potassium 3.3 (L) 3.5 - 5.2 mmol/L   Chloride 98 96 - 106 mmol/L   CO2 22 20 - 29 mmol/L   Calcium 9.3 8.6 - 10.2 mg/dL   Total Protein 7.7 6.0 - 8.5 g/dL   Albumin 4.2 3.8 - 4.9 g/dL   Globulin, Total 3.5 1.5 - 4.5 g/dL   Albumin/Globulin Ratio 1.2 1.2 - 2.2   Bilirubin Total 0.5 0.0 - 1.2 mg/dL   Alkaline Phosphatase 116 44 - 121 IU/L   AST 27 0 - 40 IU/L   ALT 29 0 - 44 IU/L  TSH  Result Value Ref Range   TSH 2.040 0.450 - 4.500 uIU/mL  Hemoglobin A1c  Result Value Ref Range   Hgb A1c MFr Bld 6.0 (H) 4.8 - 5.6 %   Est. 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  Recent notes reviewed.  Scheduled for repeat MRI 03/05/21, will follow-up with him after this.  Continue off ASA at this time.  If any acute symptoms present immediately notify provider or go to ER.         Other   Fever    Acute x 2 days of symptoms.  Will obtain Covid testing today and recommend self quarantine until results return.  Discussed oral treatment options if Covid present.  At this time continue OTC simple treatment, including Coricidin as needed for congestion of cough.  Ensure plenty of rest and hydration at home.  Return for worsening or ongoing symptoms.         Relevant Orders   Novel Coronavirus, NAA ([facility])     Follow up plan: Return in about 3 months (around 03/15/2021) for Brain mass vs bleed.

## 2020-12-08 NOTE — Patient Instructions (Signed)
Upper Respiratory Infection, Adult  An upper respiratory infection (URI) affects the nose, throat, and upper air passages. URIs are caused by germs (viruses). The most common type of URI is often called "the common cold."  Medicines cannot cure URIs, but you can do things at home to relieve your symptoms. URIs usually get better within 7-10 days.  Follow these instructions at home:  Activity  Rest as needed.  If you have a fever, stay home from work or school until your fever is gone, or until your doctor says you may return to work or school.  You should stay home until you cannot spread the infection anymore (you are not contagious).  Your doctor may have you wear a face mask so you have less risk of spreading the infection.  Relieving symptoms  Gargle with a salt-water mixture 3-4 times a day or as needed. To make a salt-water mixture, completely dissolve -1 tsp of salt in 1 cup of warm water.  Use a cool-mist humidifier to add moisture to the air. This can help you breathe more easily.  Eating and drinking    Drink enough fluid to keep your pee (urine) pale yellow.  Eat soups and other clear broths.  General instructions    Take over-the-counter and prescription medicines only as told by your doctor. These include cold medicines, fever reducers, and cough suppressants.  Do not use any products that contain nicotine or tobacco. These include cigarettes and e-cigarettes. If you need help quitting, ask your doctor.  Avoid being where people are smoking (avoid secondhand smoke).  Make sure you get regular shots and get the flu shot every year.  Keep all follow-up visits as told by your doctor. This is important.  How to avoid spreading infection to others    Wash your hands often with soap and water. If you do not have soap and water, use hand sanitizer.  Avoid touching your mouth, face, eyes, or nose.  Cough or sneeze into a tissue or your sleeve or elbow. Do not cough or sneeze into your hand or into the  air.  Contact a doctor if:  You are getting worse, not better.  You have any of these:  A fever.  Chills.  Brown or red mucus in your nose.  Yellow or brown fluid (discharge)coming from your nose.  Pain in your face, especially when you bend forward.  Swollen neck glands.  Pain with swallowing.  White areas in the back of your throat.  Get help right away if:  You have shortness of breath that gets worse.  You have very bad or constant:  Headache.  Ear pain.  Pain in your forehead, behind your eyes, and over your cheekbones (sinus pain).  Chest pain.  You have long-lasting (chronic) lung disease along with any of these:  Wheezing.  Long-lasting cough.  Coughing up blood.  A change in your usual mucus.  You have a stiff neck.  You have changes in your:  Vision.  Hearing.  Thinking.  Mood.  Summary  An upper respiratory infection (URI) is caused by a germ called a virus. The most common type of URI is often called "the common cold."  URIs usually get better within 7-10 days.  Take over-the-counter and prescription medicines only as told by your doctor.  This information is not intended to replace advice given to you by your health care provider. Make sure you discuss any questions you have with your health care provider.  Document 

## 2020-12-09 LAB — SARS-COV-2, NAA 2 DAY TAT

## 2020-12-09 LAB — NOVEL CORONAVIRUS, NAA: SARS-CoV-2, NAA: NOT DETECTED

## 2020-12-09 NOTE — Progress Notes (Signed)
Contacted via [place]   I have good news Paul Cannon, your Covid testing did return negative.  I do recommend if ongoing fever or symptoms over next day or two then repeating testing at home.  For now continue over the counter treatment and if worsening return to office.  Any questions? Keep being awesome!!  Thank you for allowing me to participate in your care.  I appreciate you. Kindest regards, Shauni Henner

## 2020-12-11 ENCOUNTER — Ambulatory Visit
Admission: RE | Admit: 2020-12-11 | Discharge: 2020-12-11 | Disposition: A | Discharge status: Home / Self Care | Payer: Commercial Managed Care - PPO | Attending: Nurse Practitioner | Admitting: Nurse Practitioner

## 2020-12-11 ENCOUNTER — Encounter: Payer: Self-pay | Admitting: Nurse Practitioner

## 2020-12-11 ENCOUNTER — Ambulatory Visit (INDEPENDENT_AMBULATORY_CARE_PROVIDER_SITE_OTHER): Payer: Commercial Managed Care - PPO | Admitting: Nurse Practitioner

## 2020-12-11 ENCOUNTER — Ambulatory Visit
Admission: RE | Admit: 2020-12-11 | Discharge: 2020-12-11 | Disposition: A | Discharge status: Home / Self Care | Payer: Commercial Managed Care - PPO | Source: Ambulatory Visit | Attending: Nurse Practitioner | Admitting: Nurse Practitioner

## 2020-12-11 ENCOUNTER — Other Ambulatory Visit: Payer: Self-pay

## 2020-12-11 VITALS — BP 105/65 | HR 67 | Temp 99.6°F | Wt 306.0 lb

## 2020-12-11 DIAGNOSIS — R059 Cough, unspecified: Secondary | ICD-10-CM | POA: Diagnosis not present

## 2020-12-11 MED ORDER — ALBUTEROL SULFATE HFA 108 (90 BASE) MCG/ACT IN AERS: 2.0000 | INHALATION_SPRAY | RESPIRATORY_TRACT | 4 refills | Status: DC | PRN

## 2020-12-11 MED ORDER — AZITHROMYCIN 250 MG PO TABS: ORAL_TABLET | ORAL | 0 refills | Status: DC

## 2020-12-11 MED ORDER — AMOXICILLIN-POT CLAVULANATE 875-125 MG PO TABS: 1.0000 | ORAL_TABLET | Freq: Two times a day (BID) | ORAL | 0 refills | Status: AC

## 2020-12-11 NOTE — Assessment & Plan Note (Signed)
Ongoing for > 5 days with negative Covid testing in office recently.  At this time will treat for pneumonia with Augmentin and Azithromycin, educated wife and patient on this.  Chest imaging ordered to further assess.  Will send in Albuterol inhaler to use as needed. Avoid Prednisone as is just completing Decadron.  Recommend OTC Coricidin for cough, as will not increase BP, plus ensure good rest and hydration at home.  Have return next week for follow-up.  If worsening over weekend immediately go to ER or UC.

## 2020-12-11 NOTE — Patient Instructions (Signed)
IMAGING:  [address], [place], [place] [address]  Community-Acquired Pneumonia, Adult Pneumonia is an infection of the lungs. It causes irritation and swelling in the airways of the lungs. Mucus and fluid may also build up inside the airways.This may cause coughing and trouble breathing. One type of pneumonia can happen while you are in a hospital. A different type can happen when you are not in a hospital (community-acquired pneumonia). What are the causes?  This condition is caused by germs (viruses, bacteria, or fungi). Some types of germs can spread from person to person. Pneumonia is notthought to spread from person to person. What increases the risk? You are more likely to develop this condition if: You have a long-term (chronic) disease, such as: Disease of the lungs. This may be chronic obstructive pulmonary disease (COPD) or asthma. Heart failure. Cystic fibrosis. Diabetes. Kidney disease. Sickle cell disease. HIV. You have other health problems, such as: Your body's defense system (immune system) is weak. A condition that may cause you to breathe in fluids from your mouth and nose. You had your spleen taken out. You do not take good care of your teeth and mouth (poor dental hygiene). You use or have used tobacco products. You travel where the germs that cause this illness are common. You are near certain animals or the places they live. You are older than 60 years of age. What are the signs or symptoms? Symptoms of this condition include: A cough. A fever. Sweating or chills. Chest pain, often when you breathe deeply or cough. Breathing problems, such as: Fast breathing. Trouble breathing. Shortness of breath. Feeling tired (fatigued). Muscle aches. How is this treated? Treatment for this condition depends on many things, such as: The cause of your illness. Your medicines. Your other health problems. Most adults can be treated at home. Sometimes,  treatment must happen in a hospital. Treatment may include medicines to kill germs. Medicines may depend on which germ caused your illness. Very bad pneumonia is rare. If you get it, you may: Have a machine to help you breathe. Have fluid taken away from around your lungs. Follow these instructions at home: Medicines Take over-the-counter and prescription medicines only as told by your doctor. Take cough medicine only if you are losing sleep. Cough medicine can keep your body from taking mucus away from your lungs. If you were prescribed an antibiotic medicine, take it as told by your doctor. Do not stop taking the antibiotic even if you start to feel better. Lifestyle     Do not drink alcohol. Do not use any products that contain nicotine or tobacco, such as cigarettes, e-cigarettes, and chewing tobacco. If you need help quitting, ask your doctor. Eat a healthy diet. This includes a lot of vegetables, fruits, whole grains, low-fat dairy products, and low-fat (lean) protein. General instructions  Rest a lot. Sleep for at least 8 hours each night. Sleep with your head and neck raised. Put a few pillows under your head or sleep in a reclining chair. Return to your normal activities as told by your doctor. Ask your doctor what activities are safe for you. Drink enough fluid to keep your pee (urine) pale yellow. If your throat is sore, rinse your mouth often with salt water. To make salt water, dissolve -1 tsp (3-6 g) of salt in 1 cup (237 mL) of warm water. Keep all follow-up visits as told by your doctor. This is important.  How is this prevented? You can lower your  risk of pneumonia by: Getting the pneumonia shot (vaccine). These shots have different types and schedules. Ask your doctor what works best for you. Think about getting this shot if: You are older than 60 years of age. You are 25-63 years of age and: You are being treated for cancer. You have long-term lung disease. You  have other problems that affect your body's defense system. Ask your doctor if you have one of these. Getting your flu shot every year. Ask your doctor which type of shot is best for you. Going to the dentist as often as told. Washing your hands often with soap and water for at least 20 seconds. If you cannot use soap and water, use hand sanitizer. Contact a doctor if: You have a fever. You lose sleep because your cough medicine does not help. Get help right away if: You are short of breath and this gets worse. You have more chest pain. Your sickness gets worse. This is very serious if: You are an older adult. Your body's defense system is weak. You cough up blood. These symptoms may be an emergency. Do not wait to see if the symptoms will go away. Get medical help right away. Call your local emergency services (911 in the [place] Do not drive yourself to the hospital. Summary Pneumonia is an infection of the lungs. Community-acquired pneumonia affects people who have not been in the hospital. Certain germs can cause this infection. This condition may be treated with medicines that kill germs. For very bad pneumonia, you may need a hospital stay and treatment to help with breathing. This information is not intended to replace advice given to you by your health care provider. Make sure you discuss any questions you have with your healthcare provider. Document Revised: 03/05/2019 Document Reviewed: 03/05/2019 Elsevier Patient Education  [place].

## 2020-12-11 NOTE — Progress Notes (Signed)
BP 105/65   Pulse 67   Temp 99.6 F (37.6 C) (Oral)   Wt (!) 306 lb (138.8 kg)   SpO2 91%   BMI 43.03 kg/m    Subjective:    Patient ID: Paul Cannon, male    DOB: [birth year], 60 y.o.   MRN: [id number]  HPI: Paul Cannon is a 60 y.o. male  Chief Complaint  Patient presents with   Pneumonia    Patient wife states his breathing is not getting any better and states when he tries to talk he starts to have a coughing fit. Wife states patient breathing was very labored. Patient states he is coughing up green phlegm.    UPPER RESPIRATORY TRACT INFECTION Started with symptoms 12/07/20, tested negative for Covid at office.  Wife is present today and she reports his cough has become worse and he has been coughing up green.  They are concerned about pneumonia as he is susceptible to this and has had in past. Fever:  low grade -- 99 today Cough: yes Shortness of breath: yes Wheezing: yes Chest pain: no Chest tightness: yes Chest congestion: yes Nasal congestion: yes Runny nose: no Post nasal drip: no Sneezing: no Sore throat: no Swollen glands: no Sinus pressure: no Headache: no Face pain: no Toothache: no Ear pain: none Ear pressure: none Eyes red/itching:no Eye drainage/crusting: no  Vomiting: no Rash: no Fatigue: yes Sick contacts: no Strep contacts: no  Context: fluctuating Recurrent sinusitis: no Relief with OTC cold/cough medications: no  Treatments attempted: none    Relevant past medical, surgical, family and social history reviewed and updated as indicated. Interim medical history since our last visit reviewed. Allergies and medications reviewed and updated.  Review of Systems  Constitutional:  Positive for fatigue and fever. Negative for activity change, chills and diaphoresis.  HENT:  Positive for congestion. Negative for nosebleeds, postnasal drip, rhinorrhea, sinus pressure, sinus pain, sore throat and voice change.   Respiratory:  Positive for  cough, chest tightness and wheezing. Negative for shortness of breath.   Cardiovascular:  Negative for chest pain, palpitations and leg swelling.  Neurological: Negative.   Psychiatric/Behavioral: Negative.     Per HPI unless specifically indicated above     Objective:    BP 105/65   Pulse 67   Temp 99.6 F (37.6 C) (Oral)   Wt (!) 306 lb (138.8 kg)   SpO2 91%   BMI 43.03 kg/m   Wt Readings from Last 3 Encounters:  12/11/20 (!) 306 lb (138.8 kg)  12/08/20 (!) 306 lb (138.8 kg)  12/04/20 (!) 309 lb (140.2 kg)    Physical Exam Vitals and nursing note reviewed.  Constitutional:      General: He is awake. He is not in acute distress.    Appearance: He is well-developed and well-groomed. He is obese. He is not ill-appearing or toxic-appearing.  HENT:     Head: Normocephalic and atraumatic.     Right Ear: Hearing, tympanic membrane, ear canal and external ear normal. No drainage.     Left Ear: Hearing, tympanic membrane, ear canal and external ear normal. No drainage.     Nose: Nose normal.     Right Sinus: No maxillary sinus tenderness or frontal sinus tenderness.     Left Sinus: No maxillary sinus tenderness or frontal sinus tenderness.     Mouth/Throat:     Mouth: Mucous membranes are moist.     Pharynx: Oropharynx is clear. Uvula midline.     Tonsils: No  tonsillar exudate.  Eyes:     General: Lids are normal.        Right eye: No discharge.        Left eye: No discharge.     Conjunctiva/sclera: Conjunctivae normal.     Pupils: Pupils are equal, round, and reactive to light.  Neck:     Thyroid: No thyromegaly.     Vascular: No carotid bruit.  Cardiovascular:     Rate and Rhythm: Normal rate and regular rhythm.     Heart sounds: Normal heart sounds, S1 normal and S2 normal. No murmur heard.   No gallop.  Pulmonary:     Effort: Pulmonary effort is normal. No accessory muscle usage or respiratory distress.     Breath sounds: Examination of the right-lower field reveals  rhonchi. Wheezing and rhonchi present. No decreased breath sounds.     Comments: Audible wheezing noted throughout and rhonchi to RLL.  Intermittent cough present. Abdominal:     General: Bowel sounds are normal.     Palpations: Abdomen is soft. There is no hepatomegaly or splenomegaly.  Musculoskeletal:        General: Normal range of motion.     Cervical back: Normal range of motion and neck supple.     Right lower leg: No edema.     Left lower leg: No edema.  Lymphadenopathy:     Cervical: No cervical adenopathy.  Skin:    General: Skin is warm and dry.     Capillary Refill: Capillary refill takes less than 2 seconds.     Findings: No rash.  Neurological:     Mental Status: He is alert and oriented to person, place, and time.     Deep Tendon Reflexes: Reflexes are normal and symmetric.  Psychiatric:        Attention and Perception: Attention normal.        Mood and Affect: Mood normal.        Speech: Speech normal.        Behavior: Behavior normal. Behavior is cooperative.        Thought Content: Thought content normal.    Results for orders placed or performed in visit on 12/08/20  Novel Coronavirus, NAA ([facility])   Specimen: Nasopharyngeal(NP) swabs in vial transport medium  Result Value Ref Range   SARS-CoV-2, NAA Not Detected Not Detected  SARS-COV-2, NAA 2 DAY TAT  Result Value Ref Range   SARS-CoV-2, NAA 2 DAY TAT Performed       Assessment & Plan:   Problem List Items Addressed This Visit       Other   Cough - Primary    Ongoing for > 5 days with negative Covid testing in office recently.  At this time will treat for pneumonia with Augmentin and Azithromycin, educated wife and patient on this.  Chest imaging ordered to further assess.  Will send in Albuterol inhaler to use as needed. Avoid Prednisone as is just completing Decadron.  Recommend OTC Coricidin for cough, as will not increase BP, plus ensure good rest and hydration at home.  Have return next week for  follow-up.  If worsening over weekend immediately go to ER or UC.         Relevant Orders   DG Chest 2 View     Follow up plan: Return in about 1 week (around 12/18/2020) for Pneumonia.

## 2020-12-12 NOTE — Progress Notes (Signed)
Contacted via MyChart   Good evening Paul Cannon, your imaging has returned.  It did show some possible vascular congestion on report and review of imaging, this could be related to a little fluid build up which could be from recent steroid use when we were working to get inflammation down or heart not pumping as well.  May have a mild infection too.  How are you feeling? If antibiotics helping and starting to feel better continue these.  If not feeling a lot better let me know and I may send in Lasix 10 MG for you to take daily for 5 days, this is a diuretic to help get rid of excess fluid.  However, since your blood pressure is stable I would not want to keep you on this.  Let me know how you are feeling.  Any questions? Keep being amazing!!  Thank you for allowing me to participate in your care.  I appreciate you. Kindest regards, Milbert Bixler

## 2020-12-16 ENCOUNTER — Ambulatory Visit (INDEPENDENT_AMBULATORY_CARE_PROVIDER_SITE_OTHER): Payer: Commercial Managed Care - PPO | Admitting: Nurse Practitioner

## 2020-12-16 ENCOUNTER — Other Ambulatory Visit: Payer: Self-pay

## 2020-12-16 ENCOUNTER — Encounter: Payer: Self-pay | Admitting: Nurse Practitioner

## 2020-12-16 VITALS — BP 147/68 | HR 72 | Temp 99.5°F | Wt 306.0 lb

## 2020-12-16 DIAGNOSIS — R059 Cough, unspecified: Secondary | ICD-10-CM

## 2020-12-16 NOTE — Patient Instructions (Addendum)
Repeat chest x-ray on 12/25/20 - go to [facility] for this.  Cough, Adult A cough helps to clear your throat and lungs. A cough may be a sign of anillness or another medical condition. An acute cough may only last 2-3 weeks, while a chronic cough may last 8 ormore weeks. Many things can cause a cough. They include: Germs (viruses or bacteria) that attack the airway. Breathing in things that bother (irritate) your lungs. Allergies. Asthma. Mucus that runs down the back of your throat (postnasal drip). Smoking. Acid backing up from the stomach into the tube that moves food from the mouth to the stomach (gastroesophageal reflux). Some medicines. Lung problems. Other medical conditions, such as heart failure or a blood clot in the lung (pulmonary embolism). Follow these instructions at home: Medicines Take over-the-counter and prescription medicines only as told by your doctor. Talk with your doctor before you take medicines that stop a cough (cough suppressants). Lifestyle  Do not smoke, and try not to be around smoke. Do not use any products that contain nicotine or tobacco, such as cigarettes, e-cigarettes, and chewing tobacco. If you need help quitting, ask your doctor. Drink enough fluid to keep your pee (urine) pale yellow. Avoid caffeine. Do not drink alcohol if your doctor tells you not to drink.  General instructions  Watch for any changes in your cough. Tell your doctor about them. Always cover your mouth when you cough. Stay away from things that make you cough, such as perfume, candles, campfire smoke, or cleaning products. If the air is dry, use a cool mist vaporizer or humidifier in your home. If your cough is worse at night, try using extra pillows to raise your head up higher while you sleep. Rest as needed. Keep all follow-up visits as told by your doctor. This is important.  Contact a doctor if: You have new symptoms. You cough up pus. Your cough does  not get better after 2-3 weeks, or your cough gets worse. Cough medicine does not help your cough and you are not sleeping well. You have pain that gets worse or pain that is not helped with medicine. You have a fever. You are losing weight and you do not know why. You have night sweats. Get help right away if: You cough up blood. You have trouble breathing. Your heartbeat is very fast. These symptoms may be an emergency. Do not wait to see if the symptoms will go away. Get medical help right away. Call your local emergency services (911 in the [place] Do not drive yourself to the hospital. Summary A cough helps to clear your throat and lungs. Many things can cause a cough. Take over-the-counter and prescription medicines only as told by your doctor. Always cover your mouth when you cough. Contact a doctor if you have new symptoms or you have a cough that does not get better or gets worse. This information is not intended to replace advice given to you by your health care provider. Make sure you discuss any questions you have with your healthcare provider. Document Revised: 07/12/2019 Document Reviewed: 06/11/2018 Elsevier Patient Education  [place].

## 2020-12-16 NOTE — Progress Notes (Signed)
BP (!) 147/68   Pulse 72   Temp 99.5 F (37.5 C) (Oral)   Wt (!) 306 lb (138.8 kg)   SpO2 93%   BMI 43.03 kg/m    Subjective:    Patient ID: Paul Cannon, male    DOB: [birth year], 60 y.o.   MRN: [id number]  HPI: Paul Cannon is a 60 y.o. male  Chief Complaint  Patient presents with   Pneumonia    Patients states he is feeling a bit better and states he still has a couple of days on the Augmentin.    UPPER RESPIRATORY TRACT INFECTION Started with symptoms 12/07/20, tested negative for Covid at office.  Treated on 12/08/20 with Augmentin and Azithromycin + Albuterol.  Has only a couple days left of Augmentin.  Reports he is overall feeling better with improved symptoms.  Wife now has illness.  Imaging performed on 12/11/20 and reviewed. Fever: none Cough: yes -- improving Shortness of breath: none Wheezing: none Chest pain: no Chest tightness: none Chest congestion: none Nasal congestion: none Runny nose: no Post nasal drip: no Sneezing: no Sore throat: no Swollen glands: no Sinus pressure: no Headache: no Face pain: no Toothache: no Ear pain: none Ear pressure: none Eyes red/itching:no Eye drainage/crusting: no  Vomiting: no Rash: no Fatigue: yes Sick contacts: no Strep contacts: no  Context: fluctuating Recurrent sinusitis: no Relief with OTC cold/cough medications: no  Treatments attempted: none    Relevant past medical, surgical, family and social history reviewed and updated as indicated. Interim medical history since our last visit reviewed. Allergies and medications reviewed and updated.  Review of Systems  Constitutional:  Positive for fatigue. Negative for activity change, chills, diaphoresis and fever.  HENT:  Negative for congestion, nosebleeds, postnasal drip, rhinorrhea, sinus pressure, sinus pain, sore throat and voice change.   Respiratory:  Positive for cough (improving). Negative for chest tightness, shortness of breath and wheezing.    Cardiovascular:  Negative for chest pain, palpitations and leg swelling.  Neurological: Negative.   Psychiatric/Behavioral: Negative.     Per HPI unless specifically indicated above     Objective:    BP (!) 147/68   Pulse 72   Temp 99.5 F (37.5 C) (Oral)   Wt (!) 306 lb (138.8 kg)   SpO2 93%   BMI 43.03 kg/m   Wt Readings from Last 3 Encounters:  12/16/20 (!) 306 lb (138.8 kg)  12/11/20 (!) 306 lb (138.8 kg)  12/08/20 (!) 306 lb (138.8 kg)    Physical Exam Vitals and nursing note reviewed.  Constitutional:      General: He is awake. He is not in acute distress.    Appearance: He is well-developed and well-groomed. He is obese. He is not ill-appearing or toxic-appearing.  HENT:     Head: Normocephalic and atraumatic.     Right Ear: Hearing, tympanic membrane, ear canal and external ear normal. No drainage.     Left Ear: Hearing, tympanic membrane, ear canal and external ear normal. No drainage.     Nose: Nose normal.     Right Sinus: No maxillary sinus tenderness or frontal sinus tenderness.     Left Sinus: No maxillary sinus tenderness or frontal sinus tenderness.     Mouth/Throat:     Mouth: Mucous membranes are moist.     Pharynx: Oropharynx is clear. Uvula midline.     Tonsils: No tonsillar exudate.  Eyes:     General: Lids are normal.  Right eye: No discharge.        Left eye: No discharge.     Conjunctiva/sclera: Conjunctivae normal.     Pupils: Pupils are equal, round, and reactive to light.  Neck:     Thyroid: No thyromegaly.     Vascular: No carotid bruit.  Cardiovascular:     Rate and Rhythm: Normal rate and regular rhythm.     Heart sounds: Normal heart sounds, S1 normal and S2 normal. No murmur heard.   No gallop.  Pulmonary:     Effort: Pulmonary effort is normal. No accessory muscle usage or respiratory distress.     Breath sounds: Wheezing present. No decreased breath sounds or rhonchi.     Comments: Intermittent wheezing, clears with  cough.  Overall clear on exam today with no rhonchi noted.  Decrease in cough frequency noted. Abdominal:     General: Bowel sounds are normal.     Palpations: Abdomen is soft. There is no hepatomegaly or splenomegaly.  Musculoskeletal:        General: Normal range of motion.     Cervical back: Normal range of motion and neck supple.     Right lower leg: No edema.     Left lower leg: No edema.  Lymphadenopathy:     Cervical: No cervical adenopathy.  Skin:    General: Skin is warm and dry.     Capillary Refill: Capillary refill takes less than 2 seconds.     Findings: No rash.  Neurological:     Mental Status: He is alert and oriented to person, place, and time.     Deep Tendon Reflexes: Reflexes are normal and symmetric.  Psychiatric:        Attention and Perception: Attention normal.        Mood and Affect: Mood normal.        Speech: Speech normal.        Behavior: Behavior normal. Behavior is cooperative.        Thought Content: Thought content normal.    Results for orders placed or performed in visit on 12/08/20  Novel Coronavirus, NAA ([facility])   Specimen: Nasopharyngeal(NP) swabs in vial transport medium  Result Value Ref Range   SARS-CoV-2, NAA Not Detected Not Detected  SARS-COV-2, NAA 2 DAY TAT  Result Value Ref Range   SARS-CoV-2, NAA 2 DAY TAT Performed       Assessment & Plan:   Problem List Items Addressed This Visit       Other   Cough - Primary    Acute and improving with recent treatment, suspect PNA vs bronchitis.  At this time recommend complete treatment regimen and will plan on repeat CXR 12/25/20 as recommended for 2-3 week recheck.  Is to return to office in August as scheduled, sooner if worsening.  Overall he reports improvement.       Relevant Orders   DG Chest 2 View     Follow up plan: Return for as scheduled in August.

## 2020-12-16 NOTE — Assessment & Plan Note (Signed)
Acute and improving with recent treatment, suspect PNA vs bronchitis.  At this time recommend complete treatment regimen and will plan on repeat CXR 12/25/20 as recommended for 2-3 week recheck.  Is to return to office in August as scheduled, sooner if worsening.  Overall he reports improvement.

## 2020-12-21 ENCOUNTER — Ambulatory Visit: Payer: Self-pay | Admitting: Internal Medicine

## 2020-12-21 NOTE — Progress Notes (Signed)
Pt arrived over 10  minutes late for a new pt consult and was rescheduled.

## 2020-12-23 ENCOUNTER — Other Ambulatory Visit: Payer: Self-pay | Admitting: Radiation Therapy

## 2020-12-25 ENCOUNTER — Ambulatory Visit
Admission: RE | Admit: 2020-12-25 | Discharge: 2020-12-25 | Disposition: A | Discharge status: Home / Self Care | Payer: Commercial Managed Care - PPO | Source: Ambulatory Visit | Attending: Nurse Practitioner | Admitting: Nurse Practitioner

## 2020-12-25 ENCOUNTER — Ambulatory Visit
Admission: RE | Admit: 2020-12-25 | Discharge: 2020-12-25 | Disposition: A | Discharge status: Home / Self Care | Payer: Commercial Managed Care - PPO | Attending: Nurse Practitioner | Admitting: Nurse Practitioner

## 2020-12-25 ENCOUNTER — Encounter: Payer: Self-pay | Admitting: Nurse Practitioner

## 2020-12-25 DIAGNOSIS — R059 Cough, unspecified: Secondary | ICD-10-CM | POA: Diagnosis present

## 2020-12-25 DIAGNOSIS — I700 Atherosclerosis of aorta: Secondary | ICD-10-CM | POA: Insufficient documentation

## 2020-12-25 NOTE — Progress Notes (Signed)
Contacted via [place] afternoon Gerald Stabs, your chest x-ray returned and shows no acute findings.  Overall clear.  Great news!!  It did show some aortic atherosclerosis, which we see on a lot of imaging due to the current diets and exercise regimens that society has.  See more plaque build up on vessels.  For this we recommend continuing statin daily to prevent worsening.  Any questions? Keep being awesome!!  Thank you for allowing me to participate in your care.  I appreciate you. Kindest regards, Vika Buske

## 2020-12-31 ENCOUNTER — Other Ambulatory Visit: Payer: Self-pay | Admitting: Nurse Practitioner

## 2020-12-31 NOTE — Telephone Encounter (Signed)
  Notes to clinic:  review for continued use Medication for gout flare up  Short supply given    Requested Prescriptions  Pending Prescriptions Disp Refills   colchicine 0.6 MG tablet [Pharmacy Med Name: COLCHICINE 0.6 MG TABLET] 30 tablet 0    Sig: TAKE 1 TABLET (0.6 MG TOTAL) BY MOUTH DAILY. USE ONLY WHEN ACUTE FLARE OF GOUT PRESENT.      Endocrinology:  Gout Agents Failed - 12/31/2020  8:33 AM      Failed - Uric Acid in normal range and within 360 days    Uric Acid  Date Value Ref Range Status  11/03/2020 10.9 (H) 3.8 - 8.4 mg/dL Final    Comment:               Therapeutic target for gout patients: <6.0          Passed - Cr in normal range and within 360 days    Creatinine, Ser  Date Value Ref Range Status  11/03/2020 1.22 0.76 - 1.27 mg/dL Final          Passed - Valid encounter within last 12 months    Recent Outpatient Visits           2 weeks ago Cough   [place], Barbaraann Faster, NP   2 weeks ago Cough   [place], [place] T, NP   3 weeks ago Intraparenchymal hemorrhage of brain ([place])   [place] Cannady, Barbaraann Faster, NP   1 month ago Frontal mass of brain   [employer], [place] T, NP   1 month ago Morbid obesity ([place])   [place], Barbaraann Faster, NP       Future Appointments             In 2 weeks Cannady, Barbaraann Faster, NP [facility]   In 2 months Cannady, Barbaraann Faster, NP [facility]

## 2021-01-20 ENCOUNTER — Ambulatory Visit (INDEPENDENT_AMBULATORY_CARE_PROVIDER_SITE_OTHER): Payer: Commercial Managed Care - PPO | Admitting: Nurse Practitioner

## 2021-01-20 ENCOUNTER — Encounter: Payer: Self-pay | Admitting: Nurse Practitioner

## 2021-01-20 ENCOUNTER — Other Ambulatory Visit: Payer: Self-pay

## 2021-01-20 VITALS — BP 118/71 | HR 65 | Temp 98.2°F | Ht 70.98 in | Wt 292.2 lb

## 2021-01-20 DIAGNOSIS — Z1159 Encounter for screening for other viral diseases: Secondary | ICD-10-CM

## 2021-01-20 DIAGNOSIS — I619 Nontraumatic intracerebral hemorrhage, unspecified: Secondary | ICD-10-CM

## 2021-01-20 DIAGNOSIS — N400 Enlarged prostate without lower urinary tract symptoms: Secondary | ICD-10-CM

## 2021-01-20 DIAGNOSIS — R7301 Impaired fasting glucose: Secondary | ICD-10-CM | POA: Diagnosis not present

## 2021-01-20 DIAGNOSIS — I10 Essential (primary) hypertension: Secondary | ICD-10-CM | POA: Diagnosis not present

## 2021-01-20 DIAGNOSIS — G4733 Obstructive sleep apnea (adult) (pediatric): Secondary | ICD-10-CM

## 2021-01-20 DIAGNOSIS — M1A0720 Idiopathic chronic gout, left ankle and foot, without tophus (tophi): Secondary | ICD-10-CM | POA: Diagnosis not present

## 2021-01-20 DIAGNOSIS — Z8673 Personal history of transient ischemic attack (TIA), and cerebral infarction without residual deficits: Secondary | ICD-10-CM

## 2021-01-20 DIAGNOSIS — I700 Atherosclerosis of aorta: Secondary | ICD-10-CM

## 2021-01-20 DIAGNOSIS — Z0000 Encounter for general adult medical examination without abnormal findings: Secondary | ICD-10-CM

## 2021-01-20 DIAGNOSIS — E783 Hyperchylomicronemia: Secondary | ICD-10-CM

## 2021-01-20 LAB — MICROALBUMIN, URINE WAIVED
Creatinine, Urine Waived: 300 mg/dL (ref 10–300)
Microalb, Ur Waived: 30 mg/L — ABNORMAL HIGH (ref 0–19)
Microalb/Creat Ratio: <30 mg/g (ref <30)

## 2021-01-20 NOTE — Assessment & Plan Note (Signed)
Chronic, ongoing with x 1 recent flare.  Check uric acid level today and continue current medication regimen.  Colchicine for use during acute flares and Gabapentin for his chronic knee pain as needed. Consider discontinuation of Chlorthalidone if ongoing flares.  Return in October as scheduled.

## 2021-01-20 NOTE — Assessment & Plan Note (Signed)
Noted on CXR 12/25/20.  Recommend continuation of statin daily, avoid ASA at this time due to recent bleed.

## 2021-01-20 NOTE — Assessment & Plan Note (Signed)
Ongoing and monitored -- diet focus, obtain A1C today.

## 2021-01-20 NOTE — Assessment & Plan Note (Signed)
Noted on 11/28/20 -- continue collaboration with oncology and neurosurgery, may benefit referral to neurology in future.  Recent notes reviewed.  Scheduled for repeat MRI 03/05/21, will follow-up with him after this.  Continue off ASA at this time.  If any acute symptoms present immediately notify provider or go to ER.

## 2021-01-20 NOTE — Progress Notes (Signed)
BP 118/71   Pulse 65   Temp 98.2 F (36.8 C)   Ht 5' 10.98" (1.803 m)   Wt 292 lb 4 oz (132.6 kg)   SpO2 97%   BMI 40.78 kg/m    Subjective:    Patient ID: Paul Cannon, male    DOB: 1961/04/27, 60 y.o.   MRN: [id number]  HPI: Paul Cannon is a 60 y.o. male presenting on 01/20/2021 for comprehensive medical examination. Current medical complaints include:none  He currently lives with: wife Interim Problems from his last visit: no   HYPERTENSION / HYPERLIPIDEMIA Followed by cardiology and neurology + oncology at this time.  Recent intraparenchymal hemorrhage diagnosed 11/03/20.  Scheduled for repeat MRI on 03/05/21 for further assessment of area.  Last saw neuro 11/28/20 and oncology 12/04/20.  Last visit with cardiology 10/17/2019 (no changes). History of CVA, lacunar infarcts.  Had past MYO screen, normal with EF 55-65%. Continues on Chlorthalidone, Micardis, Amlodipine, and Crestor.  Has underlying sleep apnea, he continues to use CPAP nightly.  Goes to [employer] for supplies and downloads -- does endorse frustration with them and needing new machine due to his current machine being recalled. Satisfied with current treatment? yes Duration of hypertension: chronic BP monitoring frequency: occasionally BP range: 110/80 range at home BP medication side effects: no Duration of hyperlipidemia: chronic Cholesterol medication side effects: no Cholesterol supplements: none Medication compliance: good compliance Aspirin: yes Recent stressors: no Recurrent headaches: no Visual changes: no Palpitations: no Dyspnea: no Chest pain: no Lower extremity edema: no Dizzy/lightheaded: no    PREDIABETES: A1C recently 6.3%. Polydipsia/polyuria: no Visual disturbance: no Chest pain: no Paresthesias: no  GOUT Continues to take Allopurinol daily + takes Colchicine as needed -- this helps the flares when they present.  Has had a recent flare due to  stressors. Duration:chronic Right 1st metatarsophalangeal pain: no Left 1st metatarsophalangeal pain: no Right knee pain: no Left knee pain: no Severity:  none at this time   Swelling: no Redness: no Trauma: no Recent dietary change or indiscretion: no Fevers: no Nausea/vomiting: no Aggravating factors: varies Alleviating factors: Colchicine Status:  stable Treatments attempted: Allopurinol and Colchicine  Functional Status Survey: Is the patient deaf or have difficulty hearing?: No Does the patient have difficulty seeing, even when wearing glasses/contacts?: No Does the patient have difficulty concentrating, remembering, or making decisions?: No Does the patient have difficulty walking or climbing stairs?: No Does the patient have difficulty dressing or bathing?: No Does the patient have difficulty doing errands alone such as visiting a doctor's office or shopping?: No  FALL RISK: Fall Risk  01/20/2021 01/17/2020 01/25/2018 07/24/2017 10/13/2015  Falls in the past year? 0 0 No No No  Number falls in past yr: 0 0 - - -  Injury with Fall? 0 0 - - -  Risk for fall due to : No Fall Risks No Fall Risks - - -  Follow up Falls evaluation completed Falls evaluation completed - - -    Depression Screen Depression screen [facility] 2/9 01/20/2021 11/03/2020 01/17/2020 07/03/2019 01/25/2018  Decreased Interest 2 1 0 0 0  Down, Depressed, Hopeless 0 1 0 0 0  PHQ - 2 Score 2 2 0 0 0  Altered sleeping 1 0 - - 0  Tired, decreased energy 1 2 - - 0  Change in appetite 0 0 - - 0  Feeling bad or failure about yourself  0 0 - - 0  Trouble concentrating 0 0 - - 0  Moving slowly or fidgety/restless 0 1 - - 0  Suicidal thoughts 0 0 - - 0  PHQ-9 Score 4 5 - - 0  Difficult doing work/chores Not difficult at all Somewhat difficult - - -    Advanced Directives <no information>  Past Medical History:  Past Medical History:  Diagnosis Date   Brain mass    Gout 2022   Hyperlipidemia    Hypertension     Obesity    Sleep apnea     Surgical History:  Past Surgical History:  Procedure Laterality Date   COLONOSCOPY WITH PROPOFOL N/A 11/17/2015   Procedure: COLONOSCOPY WITH PROPOFOL;  Surgeon: [name], MD;  Location: [facility] ENDOSCOPY;  Service: Endoscopy;  Laterality: N/A;   SHOULDER SURGERY      Medications:  Current Outpatient Medications on File Prior to Visit  Medication Sig   colchicine 0.6 MG tablet TAKE 1 TABLET (0.6 MG TOTAL) BY MOUTH DAILY. USE ONLY WHEN ACUTE FLARE OF GOUT PRESENT.   albuterol (VENTOLIN HFA) 108 (90 Base) MCG/ACT inhaler Inhale 2 puffs into the lungs every 4 (four) hours as needed for wheezing or shortness of breath.   allopurinol (ZYLOPRIM) 100 MG tablet Take 3 tablets (300 mg total) by mouth daily.   amLODipine (NORVASC) 10 MG tablet Take 1 tablet (10 mg total) by mouth daily.   Blood Glucose Monitoring Suppl (ONETOUCH VERIO REFLECT) w/Device KIT USE TO CHECK BLOOD SUGAR 3 TIMES A DAY AND DOCUMENT RESULTS, BRING TO APPOINTMENTS. GOAL IS <130 FASTING BLOOD SUGAR AND <180 TWO HOURS AFTER MEALS. IF ELEVATIONS PLEASE NOTIFY PROVIDER IMMEDIATELY.   chlorthalidone (HYGROTON) 25 MG tablet Take 1 tablet (25 mg total) by mouth daily.   cyclobenzaprine (FLEXERIL) 10 MG tablet TAKE 1 TABLET BY MOUTH EVERY DAY AT BEDTIME AS NEEDED   gabapentin (NEURONTIN) 300 MG capsule Take 1 capsule (300 mg total) by mouth daily.   glucose blood test strip Use to check blood sugar 3 times a day and document results, bring to appointments.  Goal is <130 fasting blood sugar and <180 two hours after meals.  If elevations please notify provider immediately.   Lancets (ONETOUCH ULTRASOFT) lancets Use to check blood sugar 3 times a day and document results, bring to appointments.  Goal is <130 fasting blood sugar and <180 two hours after meals.  If elevations please notify provider immediately.   Multiple Vitamins-Minerals (ONE-A-DAY MENS 50+ ADVANTAGE PO) Take 1 tablet by mouth daily.    rosuvastatin (CRESTOR) 40 MG tablet Take 1 tablet (40 mg total) by mouth 2 (two) times a week.   telmisartan (MICARDIS) 80 MG tablet Take 1 tablet (80 mg total) by mouth daily.   No current facility-administered medications on file prior to visit.    Allergies:  Allergies  Allergen Reactions   Percocet [Oxycodone-Acetaminophen] Diarrhea and Nausea And Vomiting   Vicodin [Hydrocodone-Acetaminophen] Diarrhea and Nausea And Vomiting    Social History:  Social History   Socioeconomic History   Marital status: Married    Spouse name: Not on file   Number of children: Not on file   Years of education: Not on file   Highest education level: Not on file  Occupational History   Not on file  Tobacco Use   Smoking status: Former    Types: Cigarettes    Quit date: 10/01/2010    Years since quitting: 10.3   Smokeless tobacco: Former  Scientific laboratory technician Use: Never used  Substance and Sexual Activity   Alcohol  use: Yes    Comment: pt states occasional or very rarely   Drug use: No   Sexual activity: Yes  Other Topics Concern   Not on file  Social History Narrative   Not on file   Social Determinants of Health   Financial Resource Strain: Not on file  Food Insecurity: Not on file  Transportation Needs: Not on file  Physical Activity: Not on file  Stress: Not on file  Social Connections: Not on file  Intimate Partner Violence: Not on file   Social History   Tobacco Use  Smoking Status Former   Types: Cigarettes   Quit date: 10/01/2010   Years since quitting: 10.3  Smokeless Tobacco Former   Social History   Substance and Sexual Activity  Alcohol Use Yes   Comment: pt states occasional or very rarely    Family History:  Family History  Problem Relation Age of Onset   Arthritis Mother    Hyperlipidemia Mother    Hypertension Mother    Migraines Mother    Heart disease Father        MI   Stroke Father    Lung disease Father    Cancer Sister        breast    Cancer Brother        melanoma   Stroke Sister     Past medical history, surgical history, medications, allergies, family history and social history reviewed with patient today and changes made to appropriate areas of the chart.   Review of Systems -  negative All other ROS negative except what is listed above and in the HPI.      Objective:    BP 118/71   Pulse 65   Temp 98.2 F (36.8 C)   Ht 5' 10.98" (1.803 m)   Wt 292 lb 4 oz (132.6 kg)   SpO2 97%   BMI 40.78 kg/m   Wt Readings from Last 3 Encounters:  01/20/21 292 lb 4 oz (132.6 kg)  12/16/20 (!) 306 lb (138.8 kg)  12/11/20 (!) 306 lb (138.8 kg)    Physical Exam Vitals and nursing note reviewed.  Constitutional:      General: He is awake. He is not in acute distress.    Appearance: He is well-developed and well-groomed. He is morbidly obese. He is not ill-appearing.  HENT:     Head: Normocephalic and atraumatic.     Right Ear: Hearing, tympanic membrane, ear canal and external ear normal. No drainage.     Left Ear: Hearing, tympanic membrane, ear canal and external ear normal. No drainage.     Nose: Nose normal.     Mouth/Throat:     Pharynx: Uvula midline.  Eyes:     General: Lids are normal.        Right eye: No discharge.        Left eye: No discharge.     Extraocular Movements: Extraocular movements intact.     Conjunctiva/sclera: Conjunctivae normal.     Pupils: Pupils are equal, round, and reactive to light.     Visual Fields: Right eye visual fields normal and left eye visual fields normal.  Neck:     Thyroid: No thyromegaly.     Vascular: No carotid bruit or JVD.     Trachea: Trachea normal.  Cardiovascular:     Rate and Rhythm: Normal rate and regular rhythm.     Heart sounds: Normal heart sounds, S1 normal and S2 normal. No murmur heard.  No gallop.  Pulmonary:     Effort: Pulmonary effort is normal. No accessory muscle usage or respiratory distress.     Breath sounds: Normal breath sounds.   Abdominal:     General: Bowel sounds are normal.     Palpations: Abdomen is soft. There is no hepatomegaly or splenomegaly.     Tenderness: There is no abdominal tenderness.  Musculoskeletal:        General: Normal range of motion.     Cervical back: Normal range of motion and neck supple.     Right lower leg: 1+ Edema present.     Left lower leg: 1+ Edema present.  Lymphadenopathy:     Head:     Right side of head: No submental, submandibular, tonsillar, preauricular or posterior auricular adenopathy.     Left side of head: No submental, submandibular, tonsillar, preauricular or posterior auricular adenopathy.     Cervical: No cervical adenopathy.  Skin:    General: Skin is warm and dry.     Capillary Refill: Capillary refill takes less than 2 seconds.     Findings: No rash.  Neurological:     Mental Status: He is alert and oriented to person, place, and time.     Cranial Nerves: Cranial nerves are intact.     Gait: Gait is intact.     Deep Tendon Reflexes: Reflexes are normal and symmetric.     Reflex Scores:      Brachioradialis reflexes are 2+ on the right side and 2+ on the left side.      Patellar reflexes are 2+ on the right side and 2+ on the left side. Psychiatric:        Attention and Perception: Attention normal.        Mood and Affect: Mood normal.        Speech: Speech normal.        Behavior: Behavior normal. Behavior is cooperative.        Thought Content: Thought content normal.        Cognition and Memory: Cognition normal.        Judgment: Judgment normal.   Results for orders placed or performed in visit on 12/08/20  Novel Coronavirus, NAA ([facility])   Specimen: Nasopharyngeal(NP) swabs in vial transport medium  Result Value Ref Range   SARS-CoV-2, NAA Not Detected Not Detected  SARS-COV-2, NAA 2 DAY TAT  Result Value Ref Range   SARS-CoV-2, NAA 2 DAY TAT Performed       Assessment & Plan:   Problem List Items Addressed This Visit        Cardiovascular and Mediastinum   Hypertension    Chronic, stable with home BP at goal in office and at home.  Continue current medication regimen and recommend DASH diet at home, lowering sodium in diet to avoid fluid retention.  Adjust regimen as needed.  Continue to collaborate with cardiology as needed.  Labs today CMP, CBC, & TSH.  Will further consider discontinuation of Chlorthalidone if ongoing gout flares and possibly need to start Hydralazine or Clonidine -- avoid BB due to low resting HR.  Return to office in October as scheduled.      Relevant Orders   CBC with Differential/Platelet   Comprehensive metabolic panel   TSH   Aortic atherosclerosis ([place])    Noted on CXR 12/25/20.  Recommend continuation of statin daily, avoid ASA at this time due to recent bleed.        Respiratory  Sleep apnea    Chronic, stable -- continue 100% use of CPAP at home.  Will print of order for new machine.      Relevant Orders   For home use only DME continuous positive airway pressure (CPAP)     Endocrine   IFG (impaired fasting glucose)    Ongoing and monitored -- diet focus, obtain A1C today.      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  Recent notes reviewed.  Scheduled for repeat MRI 03/05/21, will follow-up with him after this.  Continue off ASA at this time.  If any acute symptoms present immediately notify provider or go to ER.        Musculoskeletal and Integument   Chronic idiopathic gout involving toe of left foot without tophus    Chronic, ongoing with x 1 recent flare.  Check uric acid level today and continue current medication regimen.  Colchicine for use during acute flares and Gabapentin for his chronic knee pain as needed. Consider discontinuation of  Chlorthalidone if ongoing flares.  Return in October as scheduled.      Relevant Orders   Uric acid     Other   Hyperlipidemia    Chronic, ongoing.  Continue current medication regimen and adjust as needed -- is on twice weekly Crestor due to myalgia with increased doses -- could consider addition of Zetia.  Will plan on repeat CMP today and lipids. Educated him on stroke prevention goals: BP <130/90 LDL <70 A1C <6.5      Relevant Orders   Lipid Panel w/o Chol/HDL Ratio   Morbid obesity ([facility])    BMI 40.78 with HTN/HLD, IFG. Recommended eating smaller high protein, low fat meals more frequently and exercising 30 mins a day 5 times a week with a goal of 10-15lb weight loss in the next 3 months. Patient voiced their understanding and motivation to adhere to these recommendations.       History of stroke    In August 2020 and intraparenchymal bleed June 2022. Continue statin daily and avoid ASA due to recent bleed.  BP at goal today.  Check A1c, CMP, CBC.      Healthcare maintenance    Reviewed with patient today: - Shingrix = wishes to think about this - Hep C = ordered today - Pneumococcal vaccines = due in 5 years - Tetanus -- up to date -- due 03/05/2023 - Colonoscopy -- up to date -- due 11/16/2025      Other Visit Diagnoses     Benign prostatic hyperplasia without lower urinary tract symptoms       Check PSA today   Relevant Orders   PSA   Need for hepatitis C screening test       Hep C screening on labs today, discussed with patient.   Relevant Orders   Hepatitis C antibody   Annual physical exam       Annual labs today and health maintenance reviewed.        Discussed aspirin prophylaxis for myocardial infarction prevention and decision was made to continue ASA  LABORATORY TESTING:  Health maintenance labs ordered today as discussed above.   The natural history of prostate cancer and ongoing controversy regarding screening and potential treatment outcomes of  prostate cancer has been discussed with the patient. The meaning of a false positive PSA and a false negative PSA has been discussed. He indicates understanding of the limitations of this screening test and wishes  to proceed with screening PSA testing.   IMMUNIZATIONS:   - Tdap: Tetanus vaccination status reviewed: last tetanus booster within 10 years. - Influenza: Up to date - Pneumovax: Not applicable - Prevnar: Not applicable - Zostavax vaccine: Refused  SCREENING: - Colonoscopy: Up to date  Discussed with patient purpose of the colonoscopy is to detect colon cancer at curable precancerous or early stages   - AAA Screening: Not applicable  -Hearing Test: Not applicable  -Spirometry: Not applicable   PATIENT COUNSELING:    Sexuality: Discussed sexually transmitted diseases, partner selection, use of condoms, avoidance of unintended pregnancy  and contraceptive alternatives.   Advised to avoid cigarette smoking.  I discussed with the patient that most people either abstain from alcohol or drink within safe limits (<=14/week and <=4 drinks/occasion for males, <=7/weeks and <= 3 drinks/occasion for females) and that the risk for alcohol disorders and other health effects rises proportionally with the number of drinks per week and how often a drinker exceeds daily limits.  Discussed cessation/primary prevention of drug use and availability of treatment for abuse.   Diet: Encouraged to adjust caloric intake to maintain  or achieve ideal body weight, to reduce intake of dietary saturated fat and total fat, to limit sodium intake by avoiding high sodium foods and not adding table salt, and to maintain adequate dietary potassium and calcium preferably from fresh fruits, vegetables, and low-fat dairy products.    Stressed the importance of regular exercise  Injury prevention: Discussed safety belts, safety helmets, smoke detector, smoking near bedding or upholstery.   Dental health:  Discussed importance of regular tooth brushing, flossing, and dental visits.   Follow up plan: NEXT PREVENTATIVE PHYSICAL DUE IN 1 YEAR. Return for as scheduled in October.

## 2021-01-20 NOTE — Assessment & Plan Note (Signed)
Chronic, ongoing.  Continue current medication regimen and adjust as needed -- is on twice weekly Crestor due to myalgia with increased doses -- could consider addition of Zetia.  Will plan on repeat CMP today and lipids. Educated him on stroke prevention goals: BP <130/90 LDL <70 A1C <6.5

## 2021-01-20 NOTE — Assessment & Plan Note (Signed)
Reviewed with patient today: - Shingrix = wishes to think about this - Hep C = ordered today - Pneumococcal vaccines = due in 5 years - Tetanus -- up to date -- due 03/05/2023 - Colonoscopy -- up to date -- due 11/16/2025

## 2021-01-20 NOTE — Assessment & Plan Note (Signed)
Chronic, stable with home BP at goal in office and at home.  Continue current medication regimen and recommend DASH diet at home, lowering sodium in diet to avoid fluid retention.  Adjust regimen as needed.  Continue to collaborate with cardiology as needed.  Labs today CMP, CBC, & TSH.  Will further consider discontinuation of Chlorthalidone if ongoing gout flares and possibly need to start Hydralazine or Clonidine -- avoid BB due to low resting HR.  Return to office in October as scheduled.

## 2021-01-20 NOTE — Assessment & Plan Note (Signed)
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 Continue statin daily and avoid ASA due to recent bleed.  BP at goal today.  Check A1c, CMP, CBC. 

## 2021-01-20 NOTE — Assessment & Plan Note (Signed)
BMI 40.78 with HTN/HLD, IFG. Recommended eating smaller high protein, low fat meals more frequently and exercising 30 mins a day 5 times a week with a goal of 10-15lb weight loss in the next 3 months. Patient voiced their understanding and motivation to adhere to these recommendations.

## 2021-01-20 NOTE — Assessment & Plan Note (Signed)
Chronic, stable -- continue 100% use of CPAP at home.  Will print of order for new machine.

## 2021-01-20 NOTE — Patient Instructions (Signed)
Zoster Vaccine, Recombinant injection What is this medication? ZOSTER VACCINE (ZOS ter vak SEEN) is a vaccine used to reduce the risk of getting shingles. This vaccine is not used to treat shingles or nerve pain fromshingles. This medicine may be used for other purposes; ask your health care provider orpharmacist if you have questions. COMMON BRAND NAME(S): [facility] What should I tell my care team before I take this medication? They need to know if you have any of these conditions: cancer immune system problems an unusual or allergic reaction to Zoster vaccine, other medications, foods, dyes, or preservatives pregnant or trying to get pregnant breast-feeding How should I use this medication? This vaccine is injected into a muscle. It is given by a health care provider. A copy of Vaccine Information Statements will be given before each vaccination. Be sure to read this information carefully each time. This sheet may changeoften. Talk to your health care provider about the use of this vaccine in children.This vaccine is not approved for use in children. Overdosage: If you think you have taken too much of this medicine contact apoison control center or emergency room at once. NOTE: This medicine is only for you. Do not share this medicine with others. What if I miss a dose? Keep appointments for follow-up (booster) doses. It is important not to miss your dose. Call your health care provider if you are unable to keep anappointment. What may interact with this medication? medicines that suppress your immune system medicines to treat cancer steroid medicines like prednisone or cortisone This list may not describe all possible interactions. Give your health care provider a list of all the medicines, herbs, non-prescription drugs, or dietary supplements you use. Also tell them if you smoke, drink alcohol, or use illegaldrugs. Some items may interact with your medicine. What should I watch for while  using this medication? Visit your health care provider regularly. This vaccine, like all vaccines, may not fully protect everyone. What side effects may I notice from receiving this medication? Side effects that you should report to your doctor or health care professionalas soon as possible: allergic reactions (skin rash, itching or hives; swelling of the face, lips, or tongue) trouble breathing Side effects that usually do not require medical attention (report these toyour doctor or health care professional if they continue or are bothersome): chills headache fever nausea pain, redness, or irritation at site where injected tiredness vomiting This list may not describe all possible side effects. Call your doctor for medical advice about side effects. You may report side effects to FDA at1-800-FDA-1088. Where should I keep my medication? This vaccine is only given by a health care provider. It will not be stored athome. NOTE: This sheet is a summary. It may not cover all possible information. If you have questions about this medicine, talk to your doctor, pharmacist, orhealth care provider.  2022 Elsevier/Gold Standard (2019-06-28 16:23:07)

## 2021-01-21 LAB — CBC WITH DIFFERENTIAL/PLATELET
Basophils Absolute: 0.1 10*3/uL (ref 0.0–0.2)
Basos: 1 %
EOS (ABSOLUTE): 0.3 10*3/uL (ref 0.0–0.4)
Eos: 5 %
Hematocrit: 40.8 % (ref 37.5–51.0)
Hemoglobin: 13.4 g/dL (ref 13.0–17.7)
Immature Grans (Abs): 0 10*3/uL (ref 0.0–0.1)
Immature Granulocytes: 0 %
Lymphocytes Absolute: 1.8 10*3/uL (ref 0.7–3.1)
Lymphs: 28 %
MCH: 27.7 pg (ref 26.6–33.0)
MCHC: 32.8 g/dL (ref 31.5–35.7)
MCV: 84 fL (ref 79–97)
Monocytes Absolute: 0.5 10*3/uL (ref 0.1–0.9)
Monocytes: 7 %
Neutrophils Absolute: 3.7 10*3/uL (ref 1.4–7.0)
Neutrophils: 59 %
Platelets: 196 10*3/uL (ref 150–450)
RBC: 4.84 x10E6/uL (ref 4.14–5.80)
RDW: 15.3 % (ref 11.6–15.4)
WBC: 6.4 10*3/uL (ref 3.4–10.8)

## 2021-01-21 LAB — LIPID PANEL W/O CHOL/HDL RATIO
Cholesterol, Total: 165 mg/dL (ref 100–199)
HDL: 33 mg/dL — ABNORMAL LOW (ref >39)
LDL Chol Calc (NIH): 88 mg/dL (ref 0–99)
Triglycerides: 263 mg/dL — ABNORMAL HIGH (ref 0–149)
VLDL Cholesterol Cal: 44 mg/dL — ABNORMAL HIGH (ref 5–40)

## 2021-01-21 LAB — COMPREHENSIVE METABOLIC PANEL
ALT: 37 IU/L (ref 0–44)
AST: 31 IU/L (ref 0–40)
Albumin/Globulin Ratio: 1.6 (ref 1.2–2.2)
Albumin: 4.3 g/dL (ref 3.8–4.9)
Alkaline Phosphatase: 100 IU/L (ref 44–121)
BUN/Creatinine Ratio: 13 (ref 10–24)
BUN: 13 mg/dL (ref 8–27)
Bilirubin Total: 0.4 mg/dL (ref 0.0–1.2)
CO2: 23 mmol/L (ref 20–29)
Calcium: 9.1 mg/dL (ref 8.6–10.2)
Chloride: 99 mmol/L (ref 96–106)
Creatinine, Ser: 1 mg/dL (ref 0.76–1.27)
Globulin, Total: 2.7 g/dL (ref 1.5–4.5)
Glucose: 137 mg/dL — ABNORMAL HIGH (ref 65–99)
Potassium: 3.5 mmol/L (ref 3.5–5.2)
Sodium: 141 mmol/L (ref 134–144)
Total Protein: 7 g/dL (ref 6.0–8.5)
eGFR: 86 mL/min/{1.73_m2} (ref >59)

## 2021-01-21 LAB — HEMOGLOBIN A1C
Est. average glucose Bld gHb Est-mCnc: 134 mg/dL
Hgb A1c MFr Bld: 6.3 % — ABNORMAL HIGH (ref 4.8–5.6)

## 2021-01-21 LAB — TSH: TSH: 2.9 u[IU]/mL (ref 0.450–4.500)

## 2021-01-21 LAB — PSA: Prostate Specific Ag, Serum: 3.4 ng/mL (ref 0.0–4.0)

## 2021-01-21 LAB — URIC ACID: Uric Acid: 7.7 mg/dL (ref 3.8–8.4)

## 2021-01-21 LAB — HEPATITIS C ANTIBODY: Hep C Virus Ab: <0.1 s/co ratio (ref 0.0–0.9)

## 2021-01-22 NOTE — Progress Notes (Signed)
Contacted via MyChart   Good evening Paul Cannon, your labs have returned.   - CBC is normal - Kidney function, creatinine and eGFR, is normal.  Liver function, AST and ALT, also normal. - Cholesterol levels remain above stroke prevention goal with your LDL at 88 and triglycerides 263.  I will send in Long.  I want you to start taking this and continue your Rosuvastatin daily so we can work on tighter control. - A1c has crept up a little to 6.3%, previous 6%.  You are edging towards diabetic range of 6.5%.  Make sure you are heavily focusing on diet and reducing sugar and carbs. - Remainder of labs normal -- your PSA level, although normal is trending up, we will continue to monitor this closely -- could be that prostate is enlarging which is normal with aging and if symptoms start causing issues, like frequent night urination, dribbling, frequency then we may need to start medication to help decrease symptoms.  Any questions? Keep being awesome!!  Thank you for allowing me to participate in your care.  I appreciate you. Kindest regards, Ebbie Cherry

## 2021-01-23 ENCOUNTER — Other Ambulatory Visit: Payer: Self-pay | Admitting: Nurse Practitioner

## 2021-01-23 NOTE — Telephone Encounter (Signed)
Requested medication (s) are due for refill today: yes  Requested medication (s) are on the active medication list: yes  Last refill:  11/16/20 #30 1 RF  Future visit scheduled: yes  Notes to clinic:  med not delegated to NT to RF   Requested Prescriptions  Pending Prescriptions Disp Refills   cyclobenzaprine (FLEXERIL) 10 MG tablet [Pharmacy Med Name: CYCLOBENZAPRINE 10 MG TABLET] 30 tablet 1    Sig: TAKE 1 TABLET BY MOUTH EVERY DAY AT BEDTIME AS NEEDED     Not Delegated - Analgesics:  Muscle Relaxants Failed - 01/23/2021 12:46 PM      Failed - This refill cannot be delegated      Passed - Valid encounter within last 6 months    Recent Outpatient Visits           3 days ago Intraparenchymal hemorrhage of brain ([place])   [place], Barbaraann Faster, NP   1 month ago Cough   [place], [place] T, NP   1 month ago Cough   [place], [place] T, NP   1 month ago Intraparenchymal hemorrhage of brain ([place])   [place], Jolene T, NP   2 months ago Frontal mass of brain   [place], Barbaraann Faster, NP       Future Appointments             In 1 month Cannady, Barbaraann Faster, NP [employer]

## 2021-02-04 ENCOUNTER — Other Ambulatory Visit: Payer: Self-pay | Admitting: Nurse Practitioner

## 2021-02-04 NOTE — Telephone Encounter (Signed)
Valid encounter. Future visit in 1 month  

## 2021-02-15 ENCOUNTER — Other Ambulatory Visit: Payer: Self-pay | Admitting: Nurse Practitioner

## 2021-02-15 DIAGNOSIS — I10 Essential (primary) hypertension: Secondary | ICD-10-CM

## 2021-02-26 ENCOUNTER — Ambulatory Visit: Payer: Commercial Managed Care - PPO

## 2021-03-01 ENCOUNTER — Ambulatory Visit (INDEPENDENT_AMBULATORY_CARE_PROVIDER_SITE_OTHER): Payer: Commercial Managed Care - PPO | Admitting: Internal Medicine

## 2021-03-01 ENCOUNTER — Other Ambulatory Visit: Payer: Self-pay

## 2021-03-01 VITALS — BP 136/76 | HR 71 | Resp 18 | Ht 72.0 in | Wt 295.5 lb

## 2021-03-01 DIAGNOSIS — Z9989 Dependence on other enabling machines and devices: Secondary | ICD-10-CM

## 2021-03-01 DIAGNOSIS — Z7189 Other specified counseling: Secondary | ICD-10-CM | POA: Diagnosis not present

## 2021-03-01 DIAGNOSIS — G4733 Obstructive sleep apnea (adult) (pediatric): Secondary | ICD-10-CM | POA: Diagnosis not present

## 2021-03-01 NOTE — Progress Notes (Signed)
[facility] [place], [place] [address]  Pulmonary Sleep Medicine   Office Visit Note  Patient Name: Paul Cannon DOB: 1960/09/02 MRN [id number]    Chief Complaint: Obstructive Sleep Apnea visit  Brief History:  Paul Cannon is seen today for initial consult to establish care and acquire face to face notes for machine replacement of possible recall unit. The patient has a 17 year history of sleep apnea. Patient is using PAP nightly @ APAP 11 - 16 cmH2O.  The patient feels fine after sleeping with PAP.  The patient reports can tell difference if he doesn't use PAP from PAP use. Reported sleepiness is  better and the Epworth Sleepiness Score is 6 out of 24. The patient does not take naps. The patient complains of the following: needing replacement 7 - 8 years machine   The compliance download shows  compliance with an average use time of 8:52 hours @ 100%. The AHI is 0.6  The patient does not complain of limb movements disrupting sleep.  ROS  General: (-) fever, (-) chills, (-) night sweat Nose and Sinuses: (-) nasal stuffiness or itchiness, (-) postnasal drip, (-) nosebleeds, (-) sinus trouble. Mouth and Throat: (-) sore throat, (-) hoarseness. Neck: (-) swollen glands, (-) enlarged thyroid, (-) neck pain. Respiratory: - cough, - shortness of breath, - wheezing. Neurologic: - numbness, - tingling. Psychiatric: - anxiety, - depression   Current Medication: Outpatient Encounter Medications as of 03/01/2021  Medication Sig Note   cyclobenzaprine (FLEXERIL) 10 MG tablet TAKE 1 TABLET BY MOUTH EVERY DAY AT BEDTIME AS NEEDED    albuterol (VENTOLIN HFA) 108 (90 Base) MCG/ACT inhaler Inhale 2 puffs into the lungs every 4 (four) hours as needed for wheezing or shortness of breath.    allopurinol (ZYLOPRIM) 100 MG tablet Take 3 tablets (300 mg total) by mouth daily. 11/06/2020: Really only take 2 a day instead of 3   amLODipine (NORVASC) 10 MG tablet TAKE 1 TABLET  BY MOUTH EVERY DAY    Blood Glucose Monitoring Suppl (ONETOUCH VERIO REFLECT) w/Device KIT USE TO CHECK BLOOD SUGAR 3 TIMES A DAY AND DOCUMENT RESULTS, BRING TO APPOINTMENTS. GOAL IS <130 FASTING BLOOD SUGAR AND <180 TWO HOURS AFTER MEALS. IF ELEVATIONS PLEASE NOTIFY PROVIDER IMMEDIATELY.    chlorthalidone (HYGROTON) 25 MG tablet TAKE 1 TABLET BY MOUTH EVERY DAY    colchicine 0.6 MG tablet TAKE 1 TABLET (0.6 MG TOTAL) BY MOUTH DAILY. USE ONLY WHEN ACUTE FLARE OF GOUT PRESENT.    gabapentin (NEURONTIN) 300 MG capsule Take 1 capsule (300 mg total) by mouth daily. 11/06/2020: Per pt only as needed   glucose blood test strip Use to check blood sugar 3 times a day and document results, bring to appointments.  Goal is <130 fasting blood sugar and <180 two hours after meals.  If elevations please notify provider immediately.    Lancets (ONETOUCH ULTRASOFT) lancets Use to check blood sugar 3 times a day and document results, bring to appointments.  Goal is <130 fasting blood sugar and <180 two hours after meals.  If elevations please notify provider immediately.    Multiple Vitamins-Minerals (ONE-A-DAY MENS 50+ ADVANTAGE PO) Take 1 tablet by mouth daily.    rosuvastatin (CRESTOR) 20 MG tablet TAKE 1 TABLET BY MOUTH TWICE A WEEK    rosuvastatin (CRESTOR) 40 MG tablet Take 1 tablet (40 mg total) by mouth 2 (two) times a week.    telmisartan (MICARDIS) 80 MG tablet TAKE 1 TABLET BY MOUTH EVERY  DAY    No facility-administered encounter medications on file as of 03/01/2021.    Surgical History: Past Surgical History:  Procedure Laterality Date   COLONOSCOPY WITH PROPOFOL N/A 11/17/2015   Procedure: COLONOSCOPY WITH PROPOFOL;  Surgeon: [name], MD;  Location: [facility] ENDOSCOPY;  Service: Endoscopy;  Laterality: N/A;   SHOULDER SURGERY      Medical History: Past Medical History:  Diagnosis Date   Brain mass    Gout 2022   Hyperlipidemia    Hypertension    Obesity    Sleep apnea     Family  History: Non contributory to the present illness  Social History: Social History   Socioeconomic History   Marital status: Married    Spouse name: Not on file   Number of children: Not on file   Years of education: Not on file   Highest education level: Not on file  Occupational History   Not on file  Tobacco Use   Smoking status: Former    Types: Cigarettes    Quit date: 10/01/2010    Years since quitting: 10.4   Smokeless tobacco: Former  Scientific laboratory technician Use: Never used  Substance and Sexual Activity   Alcohol use: Yes    Comment: pt states occasional or very rarely   Drug use: No   Sexual activity: Yes  Other Topics Concern   Not on file  Social History Narrative   Not on file   Social Determinants of Health   Financial Resource Strain: Not on file  Food Insecurity: Not on file  Transportation Needs: Not on file  Physical Activity: Not on file  Stress: Not on file  Social Connections: Not on file  Intimate Partner Violence: Not on file    Vital Signs: There were no vitals taken for this visit.  Examination: General Appearance: The patient is well-developed, well-nourished, and in no distress. Neck Circumference: 45 Skin: Gross inspection of skin unremarkable. Head: normocephalic, no gross deformities. Eyes: no gross deformities noted. ENT: ears appear grossly normal Neurologic: Alert and oriented. No involuntary movements.    EPWORTH SLEEPINESS SCALE:  Scale:  (0)= no chance of dozing; (1)= slight chance of dozing; (2)= moderate chance of dozing; (3)= high chance of dozing  Chance  Situtation    Sitting and reading: 1    Watching TV: 1    Sitting Inactive in public: 1    As a passenger in car: 1      Lying down to rest: 1    Sitting and talking: 0    Sitting quielty after lunch: 1    In a car, stopped in traffic: 0   TOTAL SCORE:   6 out of 24    SLEEP STUDIES:  Split 03/29/14 - AHI 53.6, Low SpO2 @ 80%   CPAP COMPLIANCE  DATA:  Date Range: 01/30/21 - 02/28/21  Average Daily Use: 8:52 hours  Median Use: 8:52 hours  Compliance for > 4 Hours: 100%  AHI: 0.6 respiratory events per hour  Days Used: 30/30   Mask Leak: daily average 40 minutes  95th Percentile Pressure: 12 cmH2O         LABS: Recent Results (from the past 2160 hour(s))  Novel Coronavirus, NAA ([facility])     Status: None   Collection Time: 12/08/20  4:50 PM   Specimen: Nasopharyngeal(NP) swabs in vial transport medium  Result Value Ref Range   SARS-CoV-2, NAA Not Detected Not Detected    Comment: This nucleic acid amplification  test was developed and its performance characteristics determined by [employer]. Nucleic acid amplification tests include RT-PCR and TMA. This test has not been FDA cleared or approved. This test has been authorized by FDA under an Emergency Use Authorization (EUA). This test is only authorized for the duration of time the declaration that circumstances exist justifying the authorization of the emergency use of in vitro diagnostic tests for detection of SARS-CoV-2 virus and/or diagnosis of COVID-19 infection under section 564(b)(1) of the Act, 21 U.S.C. 151VOH-6(W) (1), unless the authorization is terminated or revoked sooner. When diagnostic testing is negative, the possibility of a false negative result should be considered in the context of a patient's recent exposures and the presence of clinical signs and symptoms consistent with COVID-19. An individual without symptoms of COVID-19 and who is not shedding SARS-CoV-2 virus wo uld expect to have a negative (not detected) result in this assay.   SARS-COV-2, NAA 2 DAY TAT     Status: None   Collection Time: 12/08/20  4:50 PM  Result Value Ref Range   SARS-CoV-2, NAA 2 DAY TAT Performed   Microalbumin, Urine Waived     Status: Abnormal   Collection Time: 01/20/21  8:48 AM  Result Value Ref Range   Microalb, Ur Waived 30 (H) 0 - 19 mg/L    Creatinine, Urine Waived 300 10 - 300 mg/dL   Microalb/Creat Ratio <30 <30 mg/g    Comment:                              Abnormal:       30 - 300                         High Abnormal:           >300   CBC with Differential/Platelet     Status: None   Collection Time: 01/20/21  8:53 AM  Result Value Ref Range   WBC 6.4 3.4 - 10.8 x10E3/uL   RBC 4.84 4.14 - 5.80 x10E6/uL   Hemoglobin 13.4 13.0 - 17.7 g/dL   Hematocrit 40.8 37.5 - 51.0 %   MCV 84 79 - 97 fL   MCH 27.7 26.6 - 33.0 pg   MCHC 32.8 31.5 - 35.7 g/dL   RDW 15.3 11.6 - 15.4 %   Platelets 196 150 - 450 x10E3/uL   Neutrophils 59 Not Estab. %   Lymphs 28 Not Estab. %   Monocytes 7 Not Estab. %   Eos 5 Not Estab. %   Basos 1 Not Estab. %   Neutrophils Absolute 3.7 1.4 - 7.0 x10E3/uL   Lymphocytes Absolute 1.8 0.7 - 3.1 x10E3/uL   Monocytes Absolute 0.5 0.1 - 0.9 x10E3/uL   EOS (ABSOLUTE) 0.3 0.0 - 0.4 x10E3/uL   Basophils Absolute 0.1 0.0 - 0.2 x10E3/uL   Immature Granulocytes 0 Not Estab. %   Immature Grans (Abs) 0.0 0.0 - 0.1 x10E3/uL  Comprehensive metabolic panel     Status: Abnormal   Collection Time: 01/20/21  8:53 AM  Result Value Ref Range   Glucose 137 (H) 65 - 99 mg/dL   BUN 13 8 - 27 mg/dL   Creatinine, Ser 1.00 0.76 - 1.27 mg/dL   eGFR 86 >59 mL/min/1.73   BUN/Creatinine Ratio 13 10 - 24   Sodium 141 134 - 144 mmol/L   Potassium 3.5 3.5 - 5.2 mmol/L   Chloride 99  96 - 106 mmol/L   CO2 23 20 - 29 mmol/L   Calcium 9.1 8.6 - 10.2 mg/dL   Total Protein 7.0 6.0 - 8.5 g/dL   Albumin 4.3 3.8 - 4.9 g/dL   Globulin, Total 2.7 1.5 - 4.5 g/dL   Albumin/Globulin Ratio 1.6 1.2 - 2.2   Bilirubin Total 0.4 0.0 - 1.2 mg/dL   Alkaline Phosphatase 100 44 - 121 IU/L   AST 31 0 - 40 IU/L   ALT 37 0 - 44 IU/L  Lipid Panel w/o Chol/HDL Ratio     Status: Abnormal   Collection Time: 01/20/21  8:53 AM  Result Value Ref Range   Cholesterol, Total 165 100 - 199 mg/dL   Triglycerides 263 (H) 0 - 149 mg/dL   HDL 33 (L)  >39 mg/dL   VLDL Cholesterol Cal 44 (H) 5 - 40 mg/dL   LDL Chol Calc (NIH) 88 0 - 99 mg/dL  TSH     Status: None   Collection Time: 01/20/21  8:53 AM  Result Value Ref Range   TSH 2.900 0.450 - 4.500 uIU/mL  PSA     Status: None   Collection Time: 01/20/21  8:53 AM  Result Value Ref Range   Prostate Specific Ag, Serum 3.4 0.0 - 4.0 ng/mL    Comment: Roche ECLIA methodology. According to the American Urological Association, Serum PSA should decrease and remain at undetectable levels after radical prostatectomy. The AUA defines biochemical recurrence as an initial PSA value 0.2 ng/mL or greater followed by a subsequent confirmatory PSA value 0.2 ng/mL or greater. Values obtained with different assay methods or kits cannot be used interchangeably. Results cannot be interpreted as absolute evidence of the presence or absence of malignant disease.   Uric acid     Status: None   Collection Time: 01/20/21  8:53 AM  Result Value Ref Range   Uric Acid 7.7 3.8 - 8.4 mg/dL    Comment:            Therapeutic target for gout patients: <6.0  HgB A1c     Status: Abnormal   Collection Time: 01/20/21  8:53 AM  Result Value Ref Range   Hgb A1c MFr Bld 6.3 (H) 4.8 - 5.6 %    Comment:          Prediabetes: 5.7 - 6.4          Diabetes: >6.4          Glycemic control for adults with diabetes: <7.0    Est. average glucose Bld gHb Est-mCnc 134 mg/dL  Hepatitis C antibody     Status: None   Collection Time: 01/20/21  8:53 AM  Result Value Ref Range   Hep C Virus Ab <0.1 0.0 - 0.9 s/co ratio    Comment:                                   Negative:     < 0.8                              Indeterminate: 0.8 - 0.9                                   Positive:     > 0.9  HCV antibody alone does  not differentiate between  previous resolved infection and active infection.  The CDC and current clinical guidelines recommend  that a positive HCV antibody result be followed up  with an HCV RNA test to support  the diagnosis of  acute HCV infection. [facility] offers Hepatitis C  Virus (HCV) RNA, Diagnosis, NAA (322025) and  Hepatitis C Virus (HCV) Antibody with reflex to  Quantitative Real-time PCR (144050).     Radiology: DG Chest 2 View  Result Date: 12/25/2020 CLINICAL DATA:  40-year-old male with history of cough, follow-up study. EXAM: CHEST - 2 VIEW COMPARISON:  12/11/2020 FINDINGS: The mediastinal contours are within normal limits. No cardiomegaly. The lungs are clear bilaterally without evidence of focal consolidation, pleural effusion, or pneumothorax. Atherosclerotic calcifications of the aortic arch. Mild multilevel degenerative changes of the thoracic spine. No acute osseous abnormality. IMPRESSION: No acute cardiopulmonary process. Aortic Atherosclerosis (ICD10-I70.0). Electronically Signed   By: Ruthann Cancer MD   On: 12/25/2020 15:55    No results found.  No results found.    Assessment and Plan: Patient Active Problem List   Diagnosis Date Noted   Healthcare maintenance 01/20/2021   Aortic atherosclerosis ([place]) 12/25/2020   Intraparenchymal hemorrhage of brain ([place]) 12/04/2020   Chronic idiopathic gout involving toe of left foot without tophus 07/21/2020   History of stroke 01/15/2019   IFG (impaired fasting glucose) 06/28/2018   Benign neoplasm of transverse colon    Sleep apnea 03/02/2015   Hypertension 03/02/2015   Hyperlipidemia 03/02/2015   Morbid obesity ([place]) 03/02/2015   Personal history of other malignant neoplasm of skin 12/05/2013   1. OSA on CPAP The patient does tolerate PAP and reports  benefit from PAP use. His machine is past end of life and must be replaced. The patient was reminded how to clean equipment and advised to replace supplies routinely. The patient was also counselled on weight loss. The compliance is excellent. The AHI is 0.6.   OSA- continue excellent compliance, replace machine. F/u 30d after set up.    2. CPAP use counseling CPAP  Counseling: had a lengthy discussion with the patient regarding the importance of PAP therapy in management of the sleep apnea. Patient appears to understand the risk factor reduction and also understands the risks associated with untreated sleep apnea. Patient will try to make a good faith effort to remain compliant with therapy. Also instructed the patient on proper cleaning of the device including the water must be changed daily if possible and use of distilled water is preferred. Patient understands that the machine should be regularly cleaned with appropriate recommended cleaning solutions that do not damage the PAP machine for example given white vinegar and water rinses. Other methods such as ozone treatment may not be as good as these simple methods to achieve cleaning.   3. Morbid obesity ([place]) Obesity Counseling: Had a lengthy discussion regarding patients BMI and weight issues. Patient was instructed on portion control as well as increased activity. Also discussed caloric restrictions with trying to maintain intake less than 2000 Kcal. Discussions were made in accordance with the 5As of weight management. Simple actions such as not eating late and if able to, taking a walk is suggested.      General Counseling: I have discussed the findings of the evaluation and examination with Paul Cannon.  I have also discussed any further diagnostic evaluation thatmay be needed or ordered today. Paul Cannon verbalizes understanding of the findings of todays visit. We also reviewed his medications today and discussed  drug interactions and side effects including but not limited excessive drowsiness and altered mental states. We also discussed that there is always a risk not just to him but also people around him. he has been encouraged to call the office with any questions or concerns that should arise related to todays visit.  No orders of the defined types were placed in this encounter.       I have  personally obtained a history, examined the patient, evaluated laboratory and imaging results, formulated the assessment and plan and placed orders.   This patient was seen today by Tressie Ellis, PA-C in collaboration with Dr. Devona Konig.    Allyne Gee, MD [facility] Diplomate ABMS Pulmonary and Critical Care Medicine Sleep medicine

## 2021-03-01 NOTE — Patient Instructions (Signed)

## 2021-03-04 ENCOUNTER — Other Ambulatory Visit: Payer: Self-pay | Admitting: Nurse Practitioner

## 2021-03-05 ENCOUNTER — Ambulatory Visit
Admission: RE | Admit: 2021-03-05 | Discharge: 2021-03-05 | Disposition: A | Discharge status: Home / Self Care | Payer: Commercial Managed Care - PPO | Source: Ambulatory Visit | Attending: Internal Medicine | Admitting: Internal Medicine

## 2021-03-05 ENCOUNTER — Other Ambulatory Visit: Payer: Self-pay

## 2021-03-05 DIAGNOSIS — G9389 Other specified disorders of brain: Secondary | ICD-10-CM | POA: Insufficient documentation

## 2021-03-05 MED ORDER — GADOBUTROL 1 MMOL/ML IV SOLN
10.0000 mL | Freq: Once | INTRAVENOUS | Status: AC | PRN
  Administered 2021-03-05: 10 mL via INTRAVENOUS

## 2021-03-12 ENCOUNTER — Other Ambulatory Visit: Payer: Self-pay

## 2021-03-12 ENCOUNTER — Inpatient Hospital Stay: Payer: Commercial Managed Care - PPO | Attending: Internal Medicine | Admitting: Internal Medicine

## 2021-03-12 ENCOUNTER — Encounter: Payer: Self-pay | Admitting: Internal Medicine

## 2021-03-12 VITALS — BP 120/71 | HR 65 | Temp 97.0°F | Wt 295.1 lb

## 2021-03-12 DIAGNOSIS — I619 Nontraumatic intracerebral hemorrhage, unspecified: Secondary | ICD-10-CM

## 2021-03-12 DIAGNOSIS — Z87891 Personal history of nicotine dependence: Secondary | ICD-10-CM | POA: Diagnosis not present

## 2021-03-12 DIAGNOSIS — Z7982 Long term (current) use of aspirin: Secondary | ICD-10-CM | POA: Diagnosis not present

## 2021-03-12 DIAGNOSIS — I618 Other nontraumatic intracerebral hemorrhage: Secondary | ICD-10-CM | POA: Diagnosis present

## 2021-03-12 NOTE — Progress Notes (Signed)
[facility] at [facility] [address]  [place], [place] [address] [phone]   Interval Evaluation  Date of Service: 03/12/21 Patient Name: Paul Cannon Patient MRN: [id number] Patient DOB: [birth year] Provider: Henreitta Leber, MD  Identifying Statement:  Paul Cannon is a 60 y.o. male with right frontal intraparenchymal hemorrhage  Oncologic History: 11/03/20: Presents w/ behavioral changes, Brain MRI demonstrates hemorrhagic R frontal mass   Interval History: Paul Cannon presents today for follow up after recent MRI brain.  He and his wife describe continued deficits in energy, engagement, motivation.  Has been sleeping until late in the day, not motivated to get up.  He still leaves the lawn unkempt and is less social that prior to these changes.  He is otherwise fully functional and independent around the home otherwise.  H+P (11/06/20) Patient presented to medical attention this past week with 1-2 weeks of change in personality.  Normally very outgoing and gregarious, wife noticed he had become more withdrawn, less interactive.  He has been sleeping 10-12 hours per day/night, which is also unusual for him.  Also appreciated was drooping of the left side of the face.  Otherwise denies headaches, seizures, weakness, numbness.  No other systemic symptoms which are of a concerning nature, aside from chronic gout associated joint pain.   Medications: Current Outpatient Medications on File Prior to Visit  Medication Sig Dispense Refill   allopurinol (ZYLOPRIM) 100 MG tablet Take 3 tablets (300 mg total) by mouth daily. 270 tablet 4   amLODipine (NORVASC) 10 MG tablet TAKE 1 TABLET BY MOUTH EVERY DAY 90 tablet 1   Blood Glucose Monitoring Suppl (ONETOUCH VERIO REFLECT) w/Device KIT USE TO CHECK BLOOD SUGAR 3 TIMES A DAY AND DOCUMENT RESULTS, BRING TO APPOINTMENTS. GOAL IS <130 FASTING BLOOD SUGAR AND <180 TWO HOURS AFTER MEALS. IF ELEVATIONS  PLEASE NOTIFY PROVIDER IMMEDIATELY. 1 kit 0   chlorthalidone (HYGROTON) 25 MG tablet TAKE 1 TABLET BY MOUTH EVERY DAY 90 tablet 1   colchicine 0.6 MG tablet TAKE 1 TABLET (0.6 MG TOTAL) BY MOUTH DAILY. USE ONLY WHEN ACUTE FLARE OF GOUT PRESENT. 30 tablet 0   cyclobenzaprine (FLEXERIL) 10 MG tablet TAKE 1 TABLET BY MOUTH EVERY DAY AT BEDTIME AS NEEDED 30 tablet 1   gabapentin (NEURONTIN) 300 MG capsule Take 1 capsule (300 mg total) by mouth daily. 90 capsule 3   glucose blood test strip Use to check blood sugar 3 times a day and document results, bring to appointments.  Goal is <130 fasting blood sugar and <180 two hours after meals.  If elevations please notify provider immediately. 100 each 12   Lancets (ONETOUCH ULTRASOFT) lancets Use to check blood sugar 3 times a day and document results, bring to appointments.  Goal is <130 fasting blood sugar and <180 two hours after meals.  If elevations please notify provider immediately. 100 each 12   Multiple Vitamins-Minerals (ONE-A-DAY MENS 50+ ADVANTAGE PO) Take 1 tablet by mouth daily.     rosuvastatin (CRESTOR) 40 MG tablet Take 1 tablet (40 mg total) by mouth 2 (two) times a week. 24 tablet 4   telmisartan (MICARDIS) 80 MG tablet TAKE 1 TABLET BY MOUTH EVERY DAY 90 tablet 1   No current facility-administered medications on file prior to visit.    Allergies:  Allergies  Allergen Reactions   Percocet [Oxycodone-Acetaminophen] Diarrhea and Nausea And Vomiting   Vicodin [Hydrocodone-Acetaminophen] Diarrhea and Nausea And Vomiting   Past Medical History:  Past  Medical History:  Diagnosis Date   Brain mass    Gout 2022   Hyperlipidemia    Hypertension    Obesity    Sleep apnea    Past Surgical History:  Past Surgical History:  Procedure Laterality Date   COLONOSCOPY WITH PROPOFOL N/A 11/17/2015   Procedure: COLONOSCOPY WITH PROPOFOL;  Surgeon: [name], MD;  Location: [facility] ENDOSCOPY;  Service: Endoscopy;  Laterality: N/A;   SHOULDER  SURGERY     Social History:  Social History   Socioeconomic History   Marital status: Married    Spouse name: Not on file   Number of children: Not on file   Years of education: Not on file   Highest education level: Not on file  Occupational History   Not on file  Tobacco Use   Smoking status: Former    Types: Cigarettes    Quit date: 10/01/2010    Years since quitting: 10.4   Smokeless tobacco: Former  Scientific laboratory technician Use: Never used  Substance and Sexual Activity   Alcohol use: Yes    Comment: pt states occasional or very rarely   Drug use: No   Sexual activity: Yes  Other Topics Concern   Not on file  Social History Narrative   Not on file   Social Determinants of Health   Financial Resource Strain: Not on file  Food Insecurity: Not on file  Transportation Needs: Not on file  Physical Activity: Not on file  Stress: Not on file  Social Connections: Not on file  Intimate Partner Violence: Not on file   Family History:  Family History  Problem Relation Age of Onset   Arthritis Mother    Hyperlipidemia Mother    Hypertension Mother    Migraines Mother    Heart disease Father        MI   Stroke Father    Lung disease Father    Cancer Sister        breast   Cancer Brother        melanoma   Stroke Sister     Review of Systems: Constitutional: Doesn't report fevers, chills or abnormal weight loss Eyes: Doesn't report blurriness of vision Ears, nose, mouth, throat, and face: Doesn't report sore throat Respiratory: Doesn't report cough, dyspnea or wheezes Cardiovascular: Doesn't report palpitation, chest discomfort  Gastrointestinal:  Doesn't report nausea, constipation, diarrhea GU: Doesn't report incontinence Skin: Doesn't report skin rashes Neurological: Per HPI Musculoskeletal: Doesn't report joint pain Behavioral/Psych: Doesn't report anxiety  Physical Exam: There were no vitals filed for this visit.  KPS: 90. General: Alert, cooperative,  pleasant, in no acute distress Head: Normal EENT: No conjunctival injection or scleral icterus.  Lungs: Resp effort normal Cardiac: Regular rate Abdomen: Non-distended abdomen Skin: No rashes cyanosis or petechiae. Extremities: No clubbing or edema  Neurologic Exam: Mental Status: Awake, alert, attentive to examiner. Oriented to self and environment. Language is fluent with intact comprehension.  Noted elements of agnosia, abulia. Cranial Nerves: Visual acuity is grossly normal. Visual fields are full. Extra-ocular movements intact. No ptosis. Face is symmetric Motor: Tone and bulk are normal. Power is full in both arms and legs. Reflexes are symmetric, no pathologic reflexes present.  Sensory: Intact to light touch Gait: Normal.   Labs: I have reviewed the data as listed    Component Value Date/Time   NA 141 01/20/2021 0853   K 3.5 01/20/2021 0853   CL 99 01/20/2021 0853   CO2 23 01/20/2021  0853   GLUCOSE 137 (H) 01/20/2021 0853   GLUCOSE 152 (H) 01/15/2019 0754   BUN 13 01/20/2021 0853   CREATININE 1.00 01/20/2021 0853   CALCIUM 9.1 01/20/2021 0853   PROT 7.0 01/20/2021 0853   ALBUMIN 4.3 01/20/2021 0853   AST 31 01/20/2021 0853   ALT 37 01/20/2021 0853   ALKPHOS 100 01/20/2021 0853   BILITOT 0.4 01/20/2021 0853   GFRNONAA 74 07/21/2020 0841   GFRAA 85 07/21/2020 0841   Lab Results  Component Value Date   WBC 6.4 01/20/2021   NEUTROABS 3.7 01/20/2021   HGB 13.4 01/20/2021   HCT 40.8 01/20/2021   MCV 84 01/20/2021   PLT 196 01/20/2021    Imaging:  MR BRAIN W WO CONTRAST  Result Date: 03/06/2021 CLINICAL DATA:  Three-month follow-up of frontal mass EXAM: MRI HEAD WITHOUT AND WITH CONTRAST TECHNIQUE: Multiplanar, multiecho pulse sequences of the brain and surrounding structures were obtained without and with intravenous contrast. CONTRAST:  47mL GADAVIST GADOBUTROL 1 MMOL/ML IV SOLN COMPARISON:  11/28/2020 and 11/03/2020 FINDINGS: Brain: Continued regression of  hematoma in the right frontal lobe with encephalomalacia, volume loss, and hemosiderin staining. Angular/retracted scar-like enhancement encompasses the collapsed cavity. No evident neoplasm. Small remote left cerebellar infarcts. Chronic lacunar infarct at the left caudate. Mild ischemic gliosis in the cerebral white matter. Vascular: Normal flow voids and vascular enhancements. Skull and upper cervical spine: Normal marrow signal Sinuses/Orbits: Negative IMPRESSION: Continued evolution and regression of right frontal parenchymal hemorrhage. No evidence of underlying mass. Electronically Signed   By: Jorje Guild M.D.   On: 03/06/2021 07:27     Assessment/Plan Intraparenchymal hemorrhage of brain ([place])  Dimas Alexandria is clinically and radiographically stable today.  Most recent MRI officially rules out neoplasm as etiology.  We counseled him extensively today on secondary stroke prevention, risk factors.    He is ok to resume aspirin $RemoveBefore'81mg'[id number]$  daily given atheromatous disease and CVD risk factors.    He may follow up in 1 year or as needed with worsening symptoms, headaches, seizures, etc.   All questions were answered. The patient knows to call the clinic with any problems, questions or concerns. No barriers to learning were detected.  The total time spent in the encounter was 30 minutes and more than 50% was on counseling and review of test results   Ventura Sellers, MD Medical Director of Neuro-Oncology [facility] [place] 03/12/21 10:09 AM

## 2021-03-17 ENCOUNTER — Encounter: Payer: Self-pay | Admitting: Nurse Practitioner

## 2021-03-17 ENCOUNTER — Other Ambulatory Visit: Payer: Self-pay

## 2021-03-17 ENCOUNTER — Ambulatory Visit (INDEPENDENT_AMBULATORY_CARE_PROVIDER_SITE_OTHER): Payer: Commercial Managed Care - PPO | Admitting: Nurse Practitioner

## 2021-03-17 VITALS — BP 115/69 | HR 63 | Temp 98.6°F | Wt 295.6 lb

## 2021-03-17 DIAGNOSIS — Z23 Encounter for immunization: Secondary | ICD-10-CM | POA: Diagnosis not present

## 2021-03-17 DIAGNOSIS — E783 Hyperchylomicronemia: Secondary | ICD-10-CM

## 2021-03-17 DIAGNOSIS — G4733 Obstructive sleep apnea (adult) (pediatric): Secondary | ICD-10-CM

## 2021-03-17 DIAGNOSIS — I619 Nontraumatic intracerebral hemorrhage, unspecified: Secondary | ICD-10-CM | POA: Diagnosis not present

## 2021-03-17 DIAGNOSIS — Z9989 Dependence on other enabling machines and devices: Secondary | ICD-10-CM

## 2021-03-17 DIAGNOSIS — I10 Essential (primary) hypertension: Secondary | ICD-10-CM | POA: Diagnosis not present

## 2021-03-17 MED ORDER — TELMISARTAN 80 MG PO TABS: 80.0000 mg | ORAL_TABLET | Freq: Every day | ORAL | 4 refills | Status: DC

## 2021-03-17 MED ORDER — CHLORTHALIDONE 25 MG PO TABS: 25.0000 mg | ORAL_TABLET | Freq: Every day | ORAL | 4 refills | Status: DC

## 2021-03-17 MED ORDER — AMLODIPINE BESYLATE 10 MG PO TABS: 10.0000 mg | ORAL_TABLET | Freq: Every day | ORAL | 4 refills | Status: DC

## 2021-03-17 MED ORDER — ROSUVASTATIN CALCIUM 40 MG PO TABS: 40.0000 mg | ORAL_TABLET | ORAL | 4 refills | Status: DC

## 2021-03-17 NOTE — Assessment & Plan Note (Signed)
BMI 40.09 with HTN/HLD, IFG. Recommended eating smaller high protein, low fat meals more frequently and exercising 30 mins a day 5 times a week with a goal of 10-15lb weight loss in the next 3 months. Patient voiced their understanding and motivation to adhere to these recommendations.

## 2021-03-17 NOTE — Assessment & Plan Note (Signed)
Noted on 11/28/20 -- continue collaboration with neurosurgery as needed, may benefit referral to neurology in future.  Recent notes reviewed.  Recent MRI noted regression of area and patient has improved symptoms.  Continue off ASA at this time.  If any acute symptoms present immediately notify provider or go to ER.

## 2021-03-17 NOTE — Assessment & Plan Note (Signed)
Chronic, stable -- continue 100% use of CPAP at home.   

## 2021-03-17 NOTE — Patient Instructions (Signed)

## 2021-03-17 NOTE — Assessment & Plan Note (Signed)
Chronic, ongoing.  Continue current medication regimen and adjust as needed -- is on twice weekly Crestor due to myalgia with increased doses -- could consider addition of Zetia.  Will plan on repeat CMP and lipids next visit. Educated him on stroke prevention goals: BP <130/90 LDL <70 A1C <6.5

## 2021-03-17 NOTE — Progress Notes (Signed)
BP 115/69   Pulse 63   Temp 98.6 F (37 C) (Oral)   Wt 295 lb 9.6 oz (134.1 kg)   SpO2 94%   BMI 40.09 kg/m    Subjective:    Patient ID: Paul Cannon, male    DOB: 17-Oct-1960, 60 y.o.   MRN: [id number]  HPI: Paul Cannon is a 60 y.o. male  Chief Complaint  Patient presents with   Follow-up    Patient states he saw recently oncologist last Friday and recent imaging was negative and he was cleared. Patient states he still has his moments.    HYPERTENSION / HYPERLIPIDEMIA Intraparenchymal hemorrhage diagnosed 11/03/20.  Had repeat MRI on 03/05/21 for further assessment of area -- noted continued evolution and regression of right frontal parenchymal hemorrhage.  Last saw neuro 11/28/20 and oncology 03/12/21 -- he was cleared from oncology perspective.  Last visit with cardiology 10/17/2019 (no changes).   History of CVA, lacunar infarcts.  Had past MYO screen, normal with EF 55-65%. Continues on Chlorthalidone, Micardis, Amlodipine, and Crestor.   Has underlying sleep apnea, he continues to use CPAP nightly.  Recently saw pulmonary on 03/01/21 -- for APAP. Satisfied with current treatment? yes Duration of hypertension: chronic BP monitoring frequency: occasionally BP range: 110/80 range at home BP medication side effects: no Duration of hyperlipidemia: chronic Cholesterol medication side effects: no Cholesterol supplements: none Medication compliance: good compliance Aspirin: yes Recent stressors: no Recurrent headaches: no Visual changes: no Palpitations: no Dyspnea: no Chest pain: no Lower extremity edema: no Dizzy/lightheaded: no    Relevant past medical, surgical, family and social history reviewed and updated as indicated. Interim medical history since our last visit reviewed. Allergies and medications reviewed and updated.  Review of Systems  Constitutional:  Negative for activity change, diaphoresis, fatigue and fever.  Respiratory:  Negative for cough,  chest tightness, shortness of breath and wheezing.   Cardiovascular:  Negative for chest pain, palpitations and leg swelling.  Gastrointestinal: Negative.   Neurological: Negative.   Psychiatric/Behavioral: Negative.     Per HPI unless specifically indicated above     Objective:    BP 115/69   Pulse 63   Temp 98.6 F (37 C) (Oral)   Wt 295 lb 9.6 oz (134.1 kg)   SpO2 94%   BMI 40.09 kg/m   Wt Readings from Last 3 Encounters:  03/17/21 295 lb 9.6 oz (134.1 kg)  03/12/21 295 lb 1.6 oz (133.9 kg)  03/01/21 295 lb 8 oz (134 kg)    Physical Exam Vitals and nursing note reviewed.  Constitutional:      General: He is awake. He is not in acute distress.    Appearance: He is well-developed and well-groomed. He is obese. He is not ill-appearing or toxic-appearing.  HENT:     Head: Normocephalic and atraumatic.     Right Ear: Hearing normal. No drainage.     Left Ear: Hearing normal. No drainage.  Eyes:     General: Lids are normal.        Right eye: No discharge.        Left eye: No discharge.     Conjunctiva/sclera: Conjunctivae normal.     Pupils: Pupils are equal, round, and reactive to light.  Neck:     Thyroid: No thyromegaly.     Vascular: No carotid bruit.  Cardiovascular:     Rate and Rhythm: Normal rate and regular rhythm.     Heart sounds: Normal heart sounds, S1 normal and S2  normal. No murmur heard.   No gallop.  Pulmonary:     Effort: Pulmonary effort is normal. No accessory muscle usage or respiratory distress.     Breath sounds: Normal breath sounds.  Abdominal:     General: Bowel sounds are normal.     Palpations: Abdomen is soft. There is no hepatomegaly or splenomegaly.  Musculoskeletal:        General: Normal range of motion.     Cervical back: Normal range of motion and neck supple.     Right lower leg: No edema.     Left lower leg: No edema.  Skin:    General: Skin is warm and dry.     Capillary Refill: Capillary refill takes less than 2 seconds.      Findings: No rash.  Neurological:     Mental Status: He is alert and oriented to person, place, and time.     Cranial Nerves: Cranial nerves are intact.     Gait: Gait is intact.     Deep Tendon Reflexes: Reflexes are normal and symmetric.     Reflex Scores:      Brachioradialis reflexes are 2+ on the right side and 2+ on the left side.      Patellar reflexes are 2+ on the right side and 2+ on the left side. Psychiatric:        Attention and Perception: Attention normal.        Mood and Affect: Mood normal.        Speech: Speech normal.        Behavior: Behavior normal. Behavior is cooperative.        Thought Content: Thought content normal.    Results for orders placed or performed in visit on 01/20/21  CBC with Differential/Platelet  Result Value Ref Range   WBC 6.4 3.4 - 10.8 x10E3/uL   RBC 4.84 4.14 - 5.80 x10E6/uL   Hemoglobin 13.4 13.0 - 17.7 g/dL   Hematocrit 40.8 37.5 - 51.0 %   MCV 84 79 - 97 fL   MCH 27.7 26.6 - 33.0 pg   MCHC 32.8 31.5 - 35.7 g/dL   RDW 15.3 11.6 - 15.4 %   Platelets 196 150 - 450 x10E3/uL   Neutrophils 59 Not Estab. %   Lymphs 28 Not Estab. %   Monocytes 7 Not Estab. %   Eos 5 Not Estab. %   Basos 1 Not Estab. %   Neutrophils Absolute 3.7 1.4 - 7.0 x10E3/uL   Lymphocytes Absolute 1.8 0.7 - 3.1 x10E3/uL   Monocytes Absolute 0.5 0.1 - 0.9 x10E3/uL   EOS (ABSOLUTE) 0.3 0.0 - 0.4 x10E3/uL   Basophils Absolute 0.1 0.0 - 0.2 x10E3/uL   Immature Granulocytes 0 Not Estab. %   Immature Grans (Abs) 0.0 0.0 - 0.1 x10E3/uL  Comprehensive metabolic panel  Result Value Ref Range   Glucose 137 (H) 65 - 99 mg/dL   BUN 13 8 - 27 mg/dL   Creatinine, Ser 1.00 0.76 - 1.27 mg/dL   eGFR 86 >59 mL/min/1.73   BUN/Creatinine Ratio 13 10 - 24   Sodium 141 134 - 144 mmol/L   Potassium 3.5 3.5 - 5.2 mmol/L   Chloride 99 96 - 106 mmol/L   CO2 23 20 - 29 mmol/L   Calcium 9.1 8.6 - 10.2 mg/dL   Total Protein 7.0 6.0 - 8.5 g/dL   Albumin 4.3 3.8 - 4.9 g/dL    Globulin, Total 2.7 1.5 - 4.5 g/dL   Albumin/Globulin  Ratio 1.6 1.2 - 2.2   Bilirubin Total 0.4 0.0 - 1.2 mg/dL   Alkaline Phosphatase 100 44 - 121 IU/L   AST 31 0 - 40 IU/L   ALT 37 0 - 44 IU/L  Lipid Panel w/o Chol/HDL Ratio  Result Value Ref Range   Cholesterol, Total 165 100 - 199 mg/dL   Triglycerides 263 (H) 0 - 149 mg/dL   HDL 33 (L) >39 mg/dL   VLDL Cholesterol Cal 44 (H) 5 - 40 mg/dL   LDL Chol Calc (NIH) 88 0 - 99 mg/dL  TSH  Result Value Ref Range   TSH 2.900 0.450 - 4.500 uIU/mL  PSA  Result Value Ref Range   Prostate Specific Ag, Serum 3.4 0.0 - 4.0 ng/mL  Uric acid  Result Value Ref Range   Uric Acid 7.7 3.8 - 8.4 mg/dL  HgB A1c  Result Value Ref Range   Hgb A1c MFr Bld 6.3 (H) 4.8 - 5.6 %   Est. average glucose Bld gHb Est-mCnc 134 mg/dL  Microalbumin, Urine Waived  Result Value Ref Range   Microalb, Ur Waived 30 (H) 0 - 19 mg/L   Creatinine, Urine Waived 300 10 - 300 mg/dL   Microalb/Creat Ratio <30 <30 mg/g  Hepatitis C antibody  Result Value Ref Range   Hep C Virus Ab <0.1 0.0 - 0.9 s/co ratio      Assessment & Plan:   Problem List Items Addressed This Visit       Cardiovascular and Mediastinum   Hypertension    Chronic, stable with home BP at goal in office and at home.  Continue current medication regimen and recommend DASH diet at home, lowering sodium in diet to avoid fluid retention. Adjust regimen as needed.  Continue to collaborate with cardiology as needed.  Labs next visit = CMP.  Will further consider discontinuation of Chlorthalidone if ongoing gout flares and possibly need to start Hydralazine or Clonidine -- avoid BB due to low resting HR.  Return to office in 6 months.      Relevant Medications   amLODipine (NORVASC) 10 MG tablet   rosuvastatin (CRESTOR) 40 MG tablet (Start on 03/18/2021)   telmisartan (MICARDIS) 80 MG tablet   chlorthalidone (HYGROTON) 25 MG tablet     Respiratory   OSA on CPAP    Chronic, stable -- continue 100%  use of CPAP at home.          Nervous and Auditory   Intraparenchymal hemorrhage of brain [facility]) - Primary    Noted on 11/28/20 -- continue collaboration with neurosurgery as needed, may benefit referral to neurology in future.  Recent notes reviewed.  Recent MRI noted regression of area and patient has improved symptoms.  Continue off ASA at this time.  If any acute symptoms present immediately notify provider or go to ER.        Other   Hyperlipidemia    Chronic, ongoing.  Continue current medication regimen and adjust as needed -- is on twice weekly Crestor due to myalgia with increased doses -- could consider addition of Zetia.  Will plan on repeat CMP and lipids next visit. Educated him on stroke prevention goals: BP <130/90 LDL <70 A1C <6.5      Relevant Medications   amLODipine (NORVASC) 10 MG tablet   rosuvastatin (CRESTOR) 40 MG tablet (Start on 03/18/2021)   telmisartan (MICARDIS) 80 MG tablet   chlorthalidone (HYGROTON) 25 MG tablet   Morbid obesity ([facility])    BMI  40.09 with HTN/HLD, IFG. Recommended eating smaller high protein, low fat meals more frequently and exercising 30 mins a day 5 times a week with a goal of 10-15lb weight loss in the next 3 months. Patient voiced their understanding and motivation to adhere to these recommendations.       Other Visit Diagnoses     Flu vaccine need       Flu vaccine today   Relevant Orders   Flu Vaccine QUAD 6+ mos PF IM (Fluarix Quad PF) (Completed)        Follow up plan: Return in about 6 months (around 09/15/2021).

## 2021-03-17 NOTE — Assessment & Plan Note (Signed)
Chronic, stable with home BP at goal in office and at home.  Continue current medication regimen and recommend DASH diet at home, lowering sodium in diet to avoid fluid retention. Adjust regimen as needed.  Continue to collaborate with cardiology as needed.  Labs next visit = CMP.  Will further consider discontinuation of Chlorthalidone if ongoing gout flares and possibly need to start Hydralazine or Clonidine -- avoid BB due to low resting HR.  Return to office in 6 months.

## 2021-03-22 ENCOUNTER — Encounter: Payer: Self-pay | Admitting: Internal Medicine

## 2021-03-29 ENCOUNTER — Telehealth: Payer: Self-pay | Admitting: *Deleted

## 2021-03-29 NOTE — Telephone Encounter (Signed)
Completed medical release for patient to have dental procedure done on 03/30/2021 and returned via fax to [facility].

## 2021-03-29 NOTE — Telephone Encounter (Signed)
Received call from [facility] [phone], needing to see if we got the medical release to give ok to proceed with dental extraction.  Returned call to advise no fax received at [facility].  Recommended they fax to here since MD is physically here today.

## 2021-03-31 NOTE — Telephone Encounter (Signed)
Called dentist office today and they have received medical clearance.

## 2021-04-02 ENCOUNTER — Other Ambulatory Visit: Payer: Self-pay | Admitting: Nurse Practitioner

## 2021-04-03 NOTE — Telephone Encounter (Signed)
Requested Prescriptions  Pending Prescriptions Disp Refills  . colchicine 0.6 MG tablet [Pharmacy Med Name: COLCHICINE 0.6 MG TABLET] 30 tablet 0    Sig: TAKE 1 TABLET (0.6 MG TOTAL) BY MOUTH DAILY. USE ONLY WHEN ACUTE FLARE OF GOUT PRESENT.     Endocrinology:  Gout Agents Passed - 04/02/2021  2:32 PM      Passed - Uric Acid in normal range and within 360 days    Uric Acid  Date Value Ref Range Status  01/20/2021 7.7 3.8 - 8.4 mg/dL Final    Comment:               Therapeutic target for gout patients: <6.0         Passed - Cr in normal range and within 360 days    Creatinine, Ser  Date Value Ref Range Status  01/20/2021 1.00 0.76 - 1.27 mg/dL Final         Passed - Valid encounter within last 12 months    Recent Outpatient Visits          2 weeks ago Intraparenchymal hemorrhage of brain ([place])   [place], Jolene T, NP   2 months ago Intraparenchymal hemorrhage of brain ([place])   [place], Barbaraann Faster, NP   3 months ago Cough   [place], [place] T, NP   3 months ago Cough   [place], [place] T, NP   3 months ago Intraparenchymal hemorrhage of brain ([place])   [place], Barbaraann Faster, NP      Future Appointments            In 5 months Cannady, Barbaraann Faster, NP [employer]

## 2021-04-12 ENCOUNTER — Other Ambulatory Visit: Payer: Self-pay | Admitting: Nurse Practitioner

## 2021-04-12 NOTE — Telephone Encounter (Signed)
Requested medication (s) are due for refill today: Yes  Requested medication (s) are on the active medication list: Yes  Last refill:  01/25/21  Future visit scheduled: Yes  Notes to clinic:  See request.    Requested Prescriptions  Pending Prescriptions Disp Refills   cyclobenzaprine (FLEXERIL) 10 MG tablet [Pharmacy Med Name: CYCLOBENZAPRINE 10 MG TABLET] 30 tablet 1    Sig: TAKE 1 TABLET BY MOUTH EVERY DAY AT BEDTIME AS NEEDED     Not Delegated - Analgesics:  Muscle Relaxants Failed - 04/12/2021 10:57 AM      Failed - This refill cannot be delegated      Passed - Valid encounter within last 6 months    Recent Outpatient Visits           3 weeks ago Intraparenchymal hemorrhage of brain ([place])   [place], Jolene T, NP   2 months ago Intraparenchymal hemorrhage of brain ([place])   [place] Cannady, Barbaraann Faster, NP   3 months ago Cough   [place], [place] T, NP   4 months ago Cough   [place], [place] T, NP   4 months ago Intraparenchymal hemorrhage of brain ([place])   [place], Barbaraann Faster, NP       Future Appointments             In 5 months Cannady, Barbaraann Faster, NP [employer]

## 2021-04-12 NOTE — Telephone Encounter (Signed)
Fyi.

## 2021-04-26 ENCOUNTER — Encounter: Payer: Self-pay | Admitting: Nurse Practitioner

## 2021-04-26 MED ORDER — ROSUVASTATIN CALCIUM 40 MG PO TABS: 40.0000 mg | ORAL_TABLET | ORAL | 4 refills | Status: DC

## 2021-05-02 ENCOUNTER — Other Ambulatory Visit: Payer: Self-pay | Admitting: Nurse Practitioner

## 2021-05-03 ENCOUNTER — Ambulatory Visit (INDEPENDENT_AMBULATORY_CARE_PROVIDER_SITE_OTHER): Payer: Commercial Managed Care - PPO | Admitting: Internal Medicine

## 2021-05-03 ENCOUNTER — Other Ambulatory Visit: Payer: Self-pay

## 2021-05-03 VITALS — BP 151/76 | HR 56 | Resp 18 | Ht 72.0 in | Wt 301.0 lb

## 2021-05-03 DIAGNOSIS — Z9989 Dependence on other enabling machines and devices: Secondary | ICD-10-CM | POA: Diagnosis not present

## 2021-05-03 DIAGNOSIS — G4733 Obstructive sleep apnea (adult) (pediatric): Secondary | ICD-10-CM

## 2021-05-03 DIAGNOSIS — Z7189 Other specified counseling: Secondary | ICD-10-CM

## 2021-05-03 NOTE — Patient Instructions (Signed)

## 2021-05-03 NOTE — Progress Notes (Signed)
[facility] [place], [place] [address]  Pulmonary Sleep Medicine   Office Visit Note  Patient Name: Paul Cannon DOB: November 12, 1960 MRN [id number]    Chief Complaint: Obstructive Sleep Apnea visit  Brief History:  Paul Cannon is seen today for 30 day follow up after new therapy  The patient has a 17 year history of sleep apnea. Patient is using PAP nightly.  The patient feels just  after sleeping with PAP.  The patient reports benefiting from PAP use. Epworth Sleepiness Score is 6 out of 24. The patient does take occasionally naps 15 -60 minutes with PAP on. The patient complains of the following: no complaints  The compliance download shows  compliance with an average use time of 7:59 hours @ 100%. The AHI is 0.7  The patient does not complain of limb movements disrupting sleep.  ROS  General: (-) fever, (-) chills, (-) night sweat Nose and Sinuses: (-) nasal stuffiness or itchiness, (-) postnasal drip, (-) nosebleeds, (-) sinus trouble. Mouth and Throat: (-) sore throat, (-) hoarseness. Neck: (-) swollen glands, (-) enlarged thyroid, (-) neck pain. Respiratory: - cough, - shortness of breath, - wheezing. Neurologic: - numbness, - tingling. Psychiatric: - anxiety, - depression   Current Medication: Outpatient Encounter Medications as of 05/03/2021  Medication Sig Note   allopurinol (ZYLOPRIM) 100 MG tablet Take 3 tablets (300 mg total) by mouth daily. 11/06/2020: Really only take 2 a day instead of 3   amLODipine (NORVASC) 10 MG tablet Take 1 tablet (10 mg total) by mouth daily.    chlorhexidine (PERIDEX) 0.12 % solution SMARTSIG:By Mouth    chlorthalidone (HYGROTON) 25 MG tablet Take 1 tablet (25 mg total) by mouth daily.    colchicine 0.6 MG tablet TAKE 1 TABLET (0.6 MG TOTAL) BY MOUTH DAILY. USE ONLY WHEN ACUTE FLARE OF GOUT PRESENT.    cyclobenzaprine (FLEXERIL) 10 MG tablet TAKE 1 TABLET BY MOUTH EVERY DAY AT BEDTIME AS NEEDED    gabapentin  (NEURONTIN) 300 MG capsule Take 1 capsule (300 mg total) by mouth daily. 11/06/2020: Per pt only as needed   glucose blood test strip Use to check blood sugar 3 times a day and document results, bring to appointments.  Goal is <130 fasting blood sugar and <180 two hours after meals.  If elevations please notify provider immediately.    Multiple Vitamins-Minerals (ONE-A-DAY MENS 50+ ADVANTAGE PO) Take 1 tablet by mouth daily.    rosuvastatin (CRESTOR) 40 MG tablet Take 1 tablet (40 mg total) by mouth 2 (two) times a week.    telmisartan (MICARDIS) 80 MG tablet Take 1 tablet (80 mg total) by mouth daily.    No facility-administered encounter medications on file as of 05/03/2021.    Surgical History: Past Surgical History:  Procedure Laterality Date   COLONOSCOPY WITH PROPOFOL N/A 11/17/2015   Procedure: COLONOSCOPY WITH PROPOFOL;  Surgeon: [name], MD;  Location: [facility] ENDOSCOPY;  Service: Endoscopy;  Laterality: N/A;   SHOULDER SURGERY      Medical History: Past Medical History:  Diagnosis Date   Brain mass    Gout 2022   Hyperlipidemia    Hypertension    Obesity    Sleep apnea     Family History: Non contributory to the present illness  Social History: Social History   Socioeconomic History   Marital status: Married    Spouse name: Not on file   Number of children: Not on file   Years of education: Not on file  Highest education level: Not on file  Occupational History   Not on file  Tobacco Use   Smoking status: Former    Types: Cigarettes    Quit date: 10/01/2010    Years since quitting: 10.5   Smokeless tobacco: Former  Scientific laboratory technician Use: Never used  Substance and Sexual Activity   Alcohol use: Yes    Comment: pt states occasional or very rarely   Drug use: No   Sexual activity: Yes  Other Topics Concern   Not on file  Social History Narrative   Not on file   Social Determinants of Health   Financial Resource Strain: Not on file  Food  Insecurity: Not on file  Transportation Needs: Not on file  Physical Activity: Not on file  Stress: Not on file  Social Connections: Not on file  Intimate Partner Violence: Not on file    Vital Signs: There were no vitals taken for this visit. There is no height or weight on file to calculate BMI.    Examination: General Appearance: The patient is well-developed, well-nourished, and in no distress. Neck Circumference: 45 cm Skin: Gross inspection of skin unremarkable. Head: normocephalic, no gross deformities. Eyes: no gross deformities noted. ENT: ears appear grossly normal Neurologic: Alert and oriented. No involuntary movements.    EPWORTH SLEEPINESS SCALE:  Scale:  (0)= no chance of dozing; (1)= slight chance of dozing; (2)= moderate chance of dozing; (3)= high chance of dozing  Chance  Situtation    Sitting and reading: 1    Watching TV: 1    Sitting Inactive in public: 1    As a passenger in car: 1      Lying down to rest: 1    Sitting and talking: 0    Sitting quielty after lunch: 1    In a car, stopped in traffic: 0   TOTAL SCORE:   6 out of 24    SLEEP STUDIES:  Split 03/29/14 - AHI 53.6, Low SpO2 @ 80%   CPAP COMPLIANCE DATA:  Date Range: 04/03/21 - 05/02/21  Average Daily Use: 7:59 hours  Median Use: 8:05 hours  Compliance for > 4 Hours: 100%  AHI: 0.7 respiratory events per hour  Days Used: 30/30  Mask Leak: 68.7 lpm  95th Percentile Pressure: 11.9 cmH2O  LABS: No results found for this or any previous visit (from the past 2160 hour(s)).  Radiology: MR BRAIN W WO CONTRAST  Result Date: 03/06/2021 CLINICAL DATA:  Three-month follow-up of frontal mass EXAM: MRI HEAD WITHOUT AND WITH CONTRAST TECHNIQUE: Multiplanar, multiecho pulse sequences of the brain and surrounding structures were obtained without and with intravenous contrast. CONTRAST:  51mL GADAVIST GADOBUTROL 1 MMOL/ML IV SOLN COMPARISON:  11/28/2020 and 11/03/2020  FINDINGS: Brain: Continued regression of hematoma in the right frontal lobe with encephalomalacia, volume loss, and hemosiderin staining. Angular/retracted scar-like enhancement encompasses the collapsed cavity. No evident neoplasm. Small remote left cerebellar infarcts. Chronic lacunar infarct at the left caudate. Mild ischemic gliosis in the cerebral white matter. Vascular: Normal flow voids and vascular enhancements. Skull and upper cervical spine: Normal marrow signal Sinuses/Orbits: Negative IMPRESSION: Continued evolution and regression of right frontal parenchymal hemorrhage. No evidence of underlying mass. Electronically Signed   By: Jorje Guild M.D.   On: 03/06/2021 07:27    No results found.  No results found.    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 OSA on CPAP The patient does tolerate PAP and reports  benefit from PAP use. The patient was reminded how to clean equipment and advised to replace supplies routinely. The patient was also counselled on weight loss. The compliance is excellent . The AHI is 0.7.   OSA- continue excellent compliance with pap. F/u one year.    2. CPAP use counseling CPAP Counseling: had a lengthy discussion with the patient regarding the importance of PAP therapy in management of the sleep apnea. Patient appears to understand the risk factor reduction and also understands the risks associated with untreated  sleep apnea. Patient will try to make a good faith effort to remain compliant with therapy. Also instructed the patient on proper cleaning of the device including the water must be changed daily if possible and use of distilled water is preferred. Patient understands that the machine should be regularly cleaned with appropriate recommended cleaning solutions that do not damage the PAP machine for example given white vinegar and water rinses. Other methods such as ozone treatment may not be as good as these simple methods to achieve cleaning.   3. Morbid obesity ([place]) Obesity Counseling: Had a lengthy discussion regarding patients BMI and weight issues. Patient was instructed on portion control as well as increased activity. Also discussed caloric restrictions with trying to maintain intake less than 2000 Kcal. Discussions were made in accordance with the 5As of weight management. Simple actions such as not eating late and if able to, taking a walk is suggested.     General Counseling: I have discussed the findings of the evaluation and examination with Harrell Gave.  I have also discussed any further diagnostic evaluation thatmay be needed or ordered today. Delno verbalizes understanding of the findings of todays visit. We also reviewed his medications today and discussed drug interactions and side effects including but not limited excessive drowsiness and altered mental states. We also discussed that there is always a risk not just to him but also people around him. he has been encouraged to call the office with any questions or concerns that should arise related to todays visit.  No orders of the defined types were placed in this encounter.       I have personally obtained a history, examined the patient, evaluated laboratory and imaging results, formulated the assessment and plan and placed orders. This patient was seen today by Tressie Ellis, PA-C in collaboration with Dr. Devona Konig.    Allyne Gee, MD [facility] Diplomate ABMS Pulmonary Critical Care Medicine and Sleep Medicine

## 2021-05-03 NOTE — Telephone Encounter (Signed)
Requested Prescriptions  Pending Prescriptions Disp Refills  . colchicine 0.6 MG tablet [Pharmacy Med Name: COLCHICINE 0.6 MG TABLET] 30 tablet 0    Sig: TAKE 1 TABLET (0.6 MG TOTAL) BY MOUTH DAILY. USE ONLY WHEN ACUTE FLARE OF GOUT PRESENT.     Endocrinology:  Gout Agents Passed - 05/02/2021  1:32 PM      Passed - Uric Acid in normal range and within 360 days    Uric Acid  Date Value Ref Range Status  01/20/2021 7.7 3.8 - 8.4 mg/dL Final    Comment:               Therapeutic target for gout patients: <6.0         Passed - Cr in normal range and within 360 days    Creatinine, Ser  Date Value Ref Range Status  01/20/2021 1.00 0.76 - 1.27 mg/dL Final         Passed - Valid encounter within last 12 months    Recent Outpatient Visits          1 month ago Intraparenchymal hemorrhage of brain ([place])   [place], Jolene T, NP   3 months ago Intraparenchymal hemorrhage of brain ([place])   [place], Barbaraann Faster, NP   4 months ago Cough   [place], [place] T, NP   4 months ago Cough   [place], McIntosh T, NP   4 months ago Intraparenchymal hemorrhage of brain ([place])   [place], Barbaraann Faster, NP      Future Appointments            In 4 months Cannady, Barbaraann Faster, NP [employer]

## 2021-05-06 HISTORY — PX: DENTAL SURGERY: SHX609

## 2021-05-27 ENCOUNTER — Other Ambulatory Visit: Payer: Self-pay | Admitting: Nurse Practitioner

## 2021-05-27 NOTE — Telephone Encounter (Signed)
Requested Prescriptions  Pending Prescriptions Disp Refills   colchicine 0.6 MG tablet [Pharmacy Med Name: COLCHICINE 0.6 MG TABLET] 30 tablet 0    Sig: TAKE 1 TABLET (0.6 MG TOTAL) BY MOUTH DAILY. USE ONLY WHEN ACUTE FLARE OF GOUT PRESENT.     Endocrinology:  Gout Agents Passed - 05/27/2021  1:30 PM      Passed - Uric Acid in normal range and within 360 days    Uric Acid  Date Value Ref Range Status  01/20/2021 7.7 3.8 - 8.4 mg/dL Final    Comment:               Therapeutic target for gout patients: <6.0         Passed - Cr in normal range and within 360 days    Creatinine, Ser  Date Value Ref Range Status  01/20/2021 1.00 0.76 - 1.27 mg/dL Final         Passed - Valid encounter within last 12 months    Recent Outpatient Visits          2 months ago Intraparenchymal hemorrhage of brain ([place])   [place], Jolene T, NP   4 months ago Intraparenchymal hemorrhage of brain ([place])   [place], Barbaraann Faster, NP   5 months ago Cough   [place], [place] T, NP   5 months ago Cough   [place], [place] T, NP   5 months ago Intraparenchymal hemorrhage of brain ([place])   [place], Barbaraann Faster, NP      Future Appointments            In 3 months Cannady, Barbaraann Faster, NP [employer]

## 2021-06-28 ENCOUNTER — Other Ambulatory Visit: Payer: Self-pay | Admitting: Family Medicine

## 2021-06-28 NOTE — Telephone Encounter (Signed)
Requested Prescriptions  Pending Prescriptions Disp Refills   colchicine 0.6 MG tablet [Pharmacy Med Name: COLCHICINE 0.6 MG TABLET] 30 tablet 0    Sig: TAKE 1 TABLET (0.6 MG TOTAL) BY MOUTH DAILY. USE ONLY WHEN ACUTE FLARE OF GOUT PRESENT.     Endocrinology:  Gout Agents Passed - 06/28/2021  1:31 PM      Passed - Uric Acid in normal range and within 360 days    Uric Acid  Date Value Ref Range Status  01/20/2021 7.7 3.8 - 8.4 mg/dL Final    Comment:               Therapeutic target for gout patients: <6.0         Passed - Cr in normal range and within 360 days    Creatinine, Ser  Date Value Ref Range Status  01/20/2021 1.00 0.76 - 1.27 mg/dL Final         Passed - Valid encounter within last 12 months    Recent Outpatient Visits          3 months ago Intraparenchymal hemorrhage of brain ([place])   [place], Jolene T, NP   5 months ago Intraparenchymal hemorrhage of brain ([place])   [place], Barbaraann Faster, NP   6 months ago Cough   [place], [place] T, NP   6 months ago Cough   [place], [place] T, NP   6 months ago Intraparenchymal hemorrhage of brain ([place])   [place], Barbaraann Faster, NP      Future Appointments            In 2 months Cannady, Barbaraann Faster, NP [employer] [facility]

## 2021-07-02 ENCOUNTER — Encounter: Payer: Self-pay | Admitting: Nurse Practitioner

## 2021-07-14 ENCOUNTER — Other Ambulatory Visit: Payer: Self-pay | Admitting: Nurse Practitioner

## 2021-07-14 NOTE — Telephone Encounter (Signed)
Requested Prescriptions  Pending Prescriptions Disp Refills   gabapentin (NEURONTIN) 300 MG capsule [Pharmacy Med Name: GABAPENTIN 300 MG CAPSULE] 90 capsule 0    Sig: TAKE 1 CAPSULE BY MOUTH EVERY DAY     Neurology: Anticonvulsants - gabapentin Passed - 07/14/2021 11:18 AM      Passed - Cr in normal range and within 360 days    Creatinine, Ser  Date Value Ref Range Status  01/20/2021 1.00 0.76 - 1.27 mg/dL Final         Passed - Completed PHQ-2 or PHQ-9 in the last 360 days      Passed - Valid encounter within last 12 months    Recent Outpatient Visits          3 months ago Intraparenchymal hemorrhage of brain ([place])   [place], Jolene T, NP   5 months ago Intraparenchymal hemorrhage of brain ([place])   [place] Cannady, Barbaraann Faster, NP   7 months ago Cough   [place], Maili T, NP   7 months ago Cough   [place], Roff T, NP   7 months ago Intraparenchymal hemorrhage of brain ([place])   [place], Barbaraann Faster, NP      Future Appointments            In 2 months Cannady, Barbaraann Faster, NP [employer]

## 2021-07-21 ENCOUNTER — Other Ambulatory Visit: Payer: Self-pay | Admitting: Nurse Practitioner

## 2021-07-21 NOTE — Telephone Encounter (Signed)
Requested medications are due for refill today.  unsure  Requested medications are on the active medications list.  yes  Last refill. 06/28/2021 #30 1 refill  Future visit scheduled.   yes  Notes to clinic.  Pt is only to take this during a gout flair. Pt should have 30 day supply , if taking this medication every day.    Requested Prescriptions  Pending Prescriptions Disp Refills   colchicine 0.6 MG tablet [Pharmacy Med Name: COLCHICINE 0.6 MG TABLET] 30 tablet 1    Sig: TAKE 1 TABLET (0.6 MG TOTAL) BY MOUTH DAILY. USE ONLY WHEN ACUTE FLARE OF GOUT PRESENT.     Endocrinology:  Gout Agents - colchicine Passed - 07/21/2021  2:33 PM      Passed - Cr in normal range and within 360 days    Creatinine, Ser  Date Value Ref Range Status  01/20/2021 1.00 0.76 - 1.27 mg/dL Final          Passed - ALT in normal range and within 360 days    ALT  Date Value Ref Range Status  01/20/2021 37 0 - 44 IU/L Final          Passed - AST in normal range and within 360 days    AST  Date Value Ref Range Status  01/20/2021 31 0 - 40 IU/L Final          Passed - Valid encounter within last 12 months    Recent Outpatient Visits           4 months ago Intraparenchymal hemorrhage of brain ([place])   [place], Jolene T, NP   6 months ago Intraparenchymal hemorrhage of brain ([place])   [place], Barbaraann Faster, NP   7 months ago Cough   [place], [place] T, NP   7 months ago Cough   [place], [place] T, NP   7 months ago Intraparenchymal hemorrhage of brain ([place])   [place], Henrine Screws T, NP       Future Appointments             In 2 months Cannady, Barbaraann Faster, NP [employer] [facility]            Passed - CBC within normal limits and completed in the last 12 months    WBC  Date Value Ref Range Status  01/20/2021 6.4 3.4 - 10.8 x10E3/uL Final  01/15/2019 4.5 4.0 - 10.5 K/uL  Final   RBC  Date Value Ref Range Status  01/20/2021 4.84 4.14 - 5.80 x10E6/uL Final  01/15/2019 4.82 4.22 - 5.81 MIL/uL Final   Hemoglobin  Date Value Ref Range Status  01/20/2021 13.4 13.0 - 17.7 g/dL Final   Hematocrit  Date Value Ref Range Status  01/20/2021 40.8 37.5 - 51.0 % Final   MCHC  Date Value Ref Range Status  01/20/2021 32.8 31.5 - 35.7 g/dL Final  01/15/2019 33.3 30.0 - 36.0 g/dL Final   [facility]  Date Value Ref Range Status  01/20/2021 27.7 26.6 - 33.0 pg Final  01/15/2019 28.6 26.0 - 34.0 pg Final   MCV  Date Value Ref Range Status  01/20/2021 84 79 - 97 fL Final   No results found for: PLTCOUNTKUC, LABPLAT, POCPLA RDW  Date Value Ref Range Status  01/20/2021 15.3 11.6 - 15.4 % Final

## 2021-09-21 ENCOUNTER — Ambulatory Visit: Payer: Commercial Managed Care - PPO | Admitting: Nurse Practitioner

## 2021-09-22 DIAGNOSIS — Z8679 Personal history of other diseases of the circulatory system: Secondary | ICD-10-CM | POA: Insufficient documentation

## 2021-09-22 NOTE — Patient Instructions (Addendum)

## 2021-09-23 ENCOUNTER — Ambulatory Visit (INDEPENDENT_AMBULATORY_CARE_PROVIDER_SITE_OTHER): Payer: Commercial Managed Care - PPO | Admitting: Nurse Practitioner

## 2021-09-23 ENCOUNTER — Encounter: Payer: Self-pay | Admitting: Nurse Practitioner

## 2021-09-23 VITALS — BP 121/74 | HR 54 | Temp 97.7°F | Ht 72.0 in | Wt 300.6 lb

## 2021-09-23 DIAGNOSIS — I700 Atherosclerosis of aorta: Secondary | ICD-10-CM

## 2021-09-23 DIAGNOSIS — E783 Hyperchylomicronemia: Secondary | ICD-10-CM

## 2021-09-23 DIAGNOSIS — Z8673 Personal history of transient ischemic attack (TIA), and cerebral infarction without residual deficits: Secondary | ICD-10-CM

## 2021-09-23 DIAGNOSIS — I10 Essential (primary) hypertension: Secondary | ICD-10-CM | POA: Diagnosis not present

## 2021-09-23 DIAGNOSIS — R7301 Impaired fasting glucose: Secondary | ICD-10-CM

## 2021-09-23 DIAGNOSIS — Z9989 Dependence on other enabling machines and devices: Secondary | ICD-10-CM

## 2021-09-23 DIAGNOSIS — Z8679 Personal history of other diseases of the circulatory system: Secondary | ICD-10-CM

## 2021-09-23 DIAGNOSIS — G4733 Obstructive sleep apnea (adult) (pediatric): Secondary | ICD-10-CM

## 2021-09-23 LAB — MICROALBUMIN, URINE WAIVED
Creatinine, Urine Waived: 300 mg/dL (ref 10–300)
Microalb, Ur Waived: 10 mg/L (ref 0–19)
Microalb/Creat Ratio: <30 mg/g (ref <30)

## 2021-09-23 LAB — BAYER DCA HB A1C WAIVED: HB A1C (BAYER DCA - WAIVED): 6.1 % — ABNORMAL HIGH (ref 4.8–5.6)

## 2021-09-23 MED ORDER — CYCLOBENZAPRINE HCL 10 MG PO TABS: ORAL_TABLET | ORAL | 1 refills | Status: DC

## 2021-09-23 NOTE — Assessment & Plan Note (Signed)
Ongoing and monitored -- diet focus, obtain A1C today is 6.1%, downward from previous 6.3%. ?

## 2021-09-23 NOTE — Assessment & Plan Note (Signed)
Chronic, stable -- continue 100% use of CPAP at home.   

## 2021-09-23 NOTE — Progress Notes (Signed)
? ?BP 121/74   Pulse (!) 54   Temp 97.7 ?F (36.5 ?C) (Oral)   Ht 6' (1.829 m)   Wt (!) 300 lb 9.6 oz (136.4 kg)   SpO2 96%   BMI 40.77 kg/m?   ? ?Subjective:  ? ? Patient ID: Orlan Aversa, male    DOB: 05-24-1961, 61 y.o.   MRN: [id number] ? ?HPI: ?Phelan Schadt is a 61 y.o. male ? ?Chief Complaint  ?Patient presents with  ? Hypertension  ? Aortic Atherosclerosis   ? Medication Refill  ?  Patient requesting a two-month supply refill on Flexeril at today's visit.   ? ?HYPERTENSION / HYPERLIPIDEMIA ?Intraparenchymal hemorrhage diagnosed 11/03/20.  Repeat MRI on 03/05/21 -- noted continued evolution and regression of right frontal parenchymal hemorrhage.  Last saw neuro 11/28/20 and oncology 03/12/21 -- he was cleared from oncology perspective. Last visit with cardiology 10/17/2019 (no changes). A1c last check was 6.3% in August, was on Prednisone at time.   ? ?History of CVA, lacunar infarcts.  Had past MYO screen, normal with EF 55-65%. Continues on Chlorthalidone, Micardis, Amlodipine, and Crestor. ?  ?Has underlying sleep apnea, he continues to use CPAP nightly. Recently saw pulmonary on 05/03/21 -- for CPAP. ?Satisfied with current treatment? yes ?Duration of hypertension: chronic ?BP monitoring frequency: occasionally ?BP range: 120-130/70-80 ?BP medication side effects: no ?Duration of hyperlipidemia: chronic ?Cholesterol medication side effects: no ?Cholesterol supplements: none ?Medication compliance: good compliance ?Aspirin: yes ?Recent stressors: no ?Recurrent headaches: no ?Visual changes: no ?Palpitations: no ?Dyspnea: no ?Chest pain: no ?Lower extremity edema: no ?Dizzy/lightheaded: no  ?  ?Relevant past medical, surgical, family and social history reviewed and updated as indicated. Interim medical history since our last visit reviewed. ?Allergies and medications reviewed and updated. ? ?Review of Systems  ?Constitutional:  Negative for activity change, diaphoresis, fatigue and fever.   ?Respiratory:  Negative for cough, chest tightness, shortness of breath and wheezing.   ?Cardiovascular:  Negative for chest pain, palpitations and leg swelling.  ?Gastrointestinal: Negative.   ?Neurological: Negative.   ?Psychiatric/Behavioral: Negative.    ? ?Per HPI unless specifically indicated above ? ?   ?Objective:  ?  ?BP 121/74   Pulse (!) 54   Temp 97.7 ?F (36.5 ?C) (Oral)   Ht 6' (1.829 m)   Wt (!) 300 lb 9.6 oz (136.4 kg)   SpO2 96%   BMI 40.77 kg/m?   ?Wt Readings from Last 3 Encounters:  ?09/23/21 (!) 300 lb 9.6 oz (136.4 kg)  ?05/03/21 (!) 301 lb (136.5 kg)  ?03/17/21 295 lb 9.6 oz (134.1 kg)  ?  ?Physical Exam ?Vitals and nursing note reviewed.  ?Constitutional:   ?   General: He is awake. He is not in acute distress. ?   Appearance: He is well-developed and well-groomed. He is obese. He is not ill-appearing or toxic-appearing.  ?HENT:  ?   Head: Normocephalic and atraumatic.  ?   Right Ear: Hearing normal. No drainage.  ?   Left Ear: Hearing normal. No drainage.  ?Eyes:  ?   General: Lids are normal.     ?   Right eye: No discharge.     ?   Left eye: No discharge.  ?   Conjunctiva/sclera: Conjunctivae normal.  ?   Pupils: Pupils are equal, round, and reactive to light.  ?Neck:  ?   Thyroid: No thyromegaly.  ?   Vascular: No carotid bruit.  ?Cardiovascular:  ?   Rate and Rhythm: Regular rhythm. Bradycardia present.  ?  Heart sounds: Normal heart sounds, S1 normal and S2 normal. No murmur heard. ?  No gallop.  ?Pulmonary:  ?   Effort: Pulmonary effort is normal. No accessory muscle usage or respiratory distress.  ?   Breath sounds: Normal breath sounds.  ?Abdominal:  ?   General: Bowel sounds are normal.  ?   Palpations: Abdomen is soft. There is no hepatomegaly or splenomegaly.  ?Musculoskeletal:     ?   General: Normal range of motion.  ?   Cervical back: Normal range of motion and neck supple.  ?   Right lower leg: No edema.  ?   Left lower leg: No edema.  ?Skin: ?   General: Skin is warm  and dry.  ?   Capillary Refill: Capillary refill takes less than 2 seconds.  ?   Findings: No rash.  ?Neurological:  ?   Mental Status: He is alert and oriented to person, place, and time.  ?   Gait: Gait is intact.  ?   Deep Tendon Reflexes: Reflexes are normal and symmetric.  ?   Reflex Scores: ?     Brachioradialis reflexes are 2+ on the right side and 2+ on the left side. ?     Patellar reflexes are 2+ on the right side and 2+ on the left side. ?Psychiatric:     ?   Attention and Perception: Attention normal.     ?   Mood and Affect: Mood normal.     ?   Speech: Speech normal.     ?   Behavior: Behavior normal. Behavior is cooperative.     ?   Thought Content: Thought content normal.  ? ? ?Results for orders placed or performed in visit on 01/20/21  ?CBC with Differential/Platelet  ?Result Value Ref Range  ? WBC 6.4 3.4 - 10.8 x10E3/uL  ? RBC 4.84 4.14 - 5.80 x10E6/uL  ? Hemoglobin 13.4 13.0 - 17.7 g/dL  ? Hematocrit 40.8 37.5 - 51.0 %  ? MCV 84 79 - 97 fL  ? MCH 27.7 26.6 - 33.0 pg  ? MCHC 32.8 31.5 - 35.7 g/dL  ? RDW 15.3 11.6 - 15.4 %  ? Platelets 196 150 - 450 x10E3/uL  ? Neutrophils 59 Not Estab. %  ? Lymphs 28 Not Estab. %  ? Monocytes 7 Not Estab. %  ? Eos 5 Not Estab. %  ? Basos 1 Not Estab. %  ? Neutrophils Absolute 3.7 1.4 - 7.0 x10E3/uL  ? Lymphocytes Absolute 1.8 0.7 - 3.1 x10E3/uL  ? Monocytes Absolute 0.5 0.1 - 0.9 x10E3/uL  ? EOS (ABSOLUTE) 0.3 0.0 - 0.4 x10E3/uL  ? Basophils Absolute 0.1 0.0 - 0.2 x10E3/uL  ? Immature Granulocytes 0 Not Estab. %  ? Immature Grans (Abs) 0.0 0.0 - 0.1 x10E3/uL  ?Comprehensive metabolic panel  ?Result Value Ref Range  ? Glucose 137 (H) 65 - 99 mg/dL  ? BUN 13 8 - 27 mg/dL  ? Creatinine, Ser 1.00 0.76 - 1.27 mg/dL  ? eGFR 86 >59 mL/min/1.73  ? BUN/Creatinine Ratio 13 10 - 24  ? Sodium 141 134 - 144 mmol/L  ? Potassium 3.5 3.5 - 5.2 mmol/L  ? Chloride 99 96 - 106 mmol/L  ? CO2 23 20 - 29 mmol/L  ? Calcium 9.1 8.6 - 10.2 mg/dL  ? Total Protein 7.0 6.0 - 8.5 g/dL  ?  Albumin 4.3 3.8 - 4.9 g/dL  ? Globulin, Total 2.7 1.5 - 4.5 g/dL  ? Albumin/Globulin Ratio  1.6 1.2 - 2.2  ? Bilirubin Total 0.4 0.0 - 1.2 mg/dL  ? Alkaline Phosphatase 100 44 - 121 IU/L  ? AST 31 0 - 40 IU/L  ? ALT 37 0 - 44 IU/L  ?Lipid Panel w/o Chol/HDL Ratio  ?Result Value Ref Range  ? Cholesterol, Total 165 100 - 199 mg/dL  ? Triglycerides 263 (H) 0 - 149 mg/dL  ? HDL 33 (L) >39 mg/dL  ? VLDL Cholesterol Cal 44 (H) 5 - 40 mg/dL  ? LDL Chol Calc (NIH) 88 0 - 99 mg/dL  ?TSH  ?Result Value Ref Range  ? TSH 2.900 0.450 - 4.500 uIU/mL  ?PSA  ?Result Value Ref Range  ? Prostate Specific Ag, Serum 3.4 0.0 - 4.0 ng/mL  ?Uric acid  ?Result Value Ref Range  ? Uric Acid 7.7 3.8 - 8.4 mg/dL  ?HgB A1c  ?Result Value Ref Range  ? Hgb A1c MFr Bld 6.3 (H) 4.8 - 5.6 %  ? Est. average glucose Bld gHb Est-mCnc 134 mg/dL  ?Microalbumin, Urine Waived  ?Result Value Ref Range  ? Microalb, Ur Waived 30 (H) 0 - 19 mg/L  ? Creatinine, Urine Waived 300 10 - 300 mg/dL  ? Microalb/Creat Ratio <30 <30 mg/g  ?Hepatitis C antibody  ?Result Value Ref Range  ? Hep C Virus Ab <0.1 0.0 - 0.9 s/co ratio  ? ?   ?Assessment & Plan:  ? ?Problem List Items Addressed This Visit   ? ?  ? Cardiovascular and Mediastinum  ? Aortic atherosclerosis ([place]) - Primary  ?  Noted on CXR 12/25/20.  Recommend continuation of statin daily, avoid ASA at this time due to recent bleed. ? ?  ?  ? Relevant Orders  ? Comprehensive metabolic panel  ? Lipid Panel w/o Chol/HDL Ratio  ? Hypertension  ?  Chronic, stable with home BP at goal in office and at home.  Continue current medication regimen and recommend DASH diet at home, lowering sodium in diet to avoid fluid retention. Adjust regimen as needed.  Continue to collaborate with cardiology as needed.  Labs: CMP and urine ALB today, urine ALB 13 September 2021.  Will further consider discontinuation of Chlorthalidone if ongoing gout flares and possibly need to start Hydralazine or Clonidine -- avoid BB due to low resting HR.   Return to office in 6 months. ? ?  ?  ? Relevant Orders  ? Microalbumin, Urine Waived  ? Comprehensive metabolic panel  ?  ? Respiratory  ? OSA on CPAP  ?  Chronic, stable -- continue 100% use of CPAP at home.   ? ?  ?  ?

## 2021-09-23 NOTE — Assessment & Plan Note (Signed)
Chronic, stable with home BP at goal in office and at home.  Continue current medication regimen and recommend DASH diet at home, lowering sodium in diet to avoid fluid retention. Adjust regimen as needed.  Continue to collaborate with cardiology as needed.  Labs: CMP and urine ALB today, urine ALB 13 September 2021.  Will further consider discontinuation of Chlorthalidone if ongoing gout flares and possibly need to start Hydralazine or Clonidine -- avoid BB due to low resting HR.  Return to office in 6 months. ?

## 2021-09-23 NOTE — Assessment & Plan Note (Signed)
Noted on 11/28/20 -- continue collaboration with neurosurgery as needed, may benefit referral to neurology in future.  Recent notes reviewed.  Last MRI noted regression of area and patient has improved symptoms.  Continue off ASA at this time.  If any acute symptoms present immediately notify provider or go to ER. ?

## 2021-09-23 NOTE — Assessment & Plan Note (Signed)
Noted on CXR 12/25/20.  Recommend continuation of statin daily, avoid ASA at this time due to recent bleed. ?

## 2021-09-23 NOTE — Assessment & Plan Note (Signed)
BMI 40.77 with HTN/HLD, IFG. Recommended eating smaller high protein, low fat meals more frequently and exercising 30 mins a day 5 times a week with a goal of 10-15lb weight loss in the next 3 months. Patient voiced their understanding and motivation to adhere to these recommendations. ? ?

## 2021-09-23 NOTE — Assessment & Plan Note (Signed)
Chronic, ongoing.  Continue current medication regimen and adjust as needed -- is on twice weekly Crestor due to myalgia with increased doses -- could consider addition of Zetia.  Will plan on repeat CMP and lipids today. Educated him on stroke prevention goals: ?BP <130/90 ?LDL <70 ?A1C <6.5 ?

## 2021-09-24 ENCOUNTER — Encounter: Payer: Self-pay | Admitting: Nurse Practitioner

## 2021-09-24 DIAGNOSIS — R7989 Other specified abnormal findings of blood chemistry: Secondary | ICD-10-CM | POA: Insufficient documentation

## 2021-09-24 LAB — LIPID PANEL W/O CHOL/HDL RATIO
Cholesterol, Total: 148 mg/dL (ref 100–199)
HDL: 40 mg/dL (ref >39)
LDL Chol Calc (NIH): 81 mg/dL (ref 0–99)
Triglycerides: 156 mg/dL — ABNORMAL HIGH (ref 0–149)
VLDL Cholesterol Cal: 27 mg/dL (ref 5–40)

## 2021-09-24 LAB — COMPREHENSIVE METABOLIC PANEL
ALT: 84 IU/L — ABNORMAL HIGH (ref 0–44)
AST: 73 IU/L — ABNORMAL HIGH (ref 0–40)
Albumin/Globulin Ratio: 1.5 (ref 1.2–2.2)
Albumin: 4.3 g/dL (ref 3.8–4.8)
Alkaline Phosphatase: 92 IU/L (ref 44–121)
BUN/Creatinine Ratio: 14 (ref 10–24)
BUN: 16 mg/dL (ref 8–27)
Bilirubin Total: 0.4 mg/dL (ref 0.0–1.2)
CO2: 22 mmol/L (ref 20–29)
Calcium: 9.4 mg/dL (ref 8.6–10.2)
Chloride: 103 mmol/L (ref 96–106)
Creatinine, Ser: 1.14 mg/dL (ref 0.76–1.27)
Globulin, Total: 2.9 g/dL (ref 1.5–4.5)
Glucose: 126 mg/dL — ABNORMAL HIGH (ref 70–99)
Potassium: 3.7 mmol/L (ref 3.5–5.2)
Sodium: 143 mmol/L (ref 134–144)
Total Protein: 7.2 g/dL (ref 6.0–8.5)
eGFR: 73 mL/min/{1.73_m2} (ref >59)

## 2021-09-24 NOTE — Progress Notes (Signed)
Contacted via [place] ? ? ?Good evening Paul Cannon, your labs have returned: ?- Kidney function, creatinine and eGFR, remains normal.  Liver function testing, AST and ALT, are elevated though.  This is new.  Have you been drinking any alcohol or taking frequent Tylenol?  Please let me know via a message.  We will plan to recheck these next visit.  Reduce any alcohol or Tylenol use + start focus on healthy diet and regular exercise. ?- Cholesterol levels -- would like to see LDL <70 for stroke prevention.  Please continue Rosuvastatin daily and focus heavily over next months on diet and regular exercise -- if remains above 70 next visit we may add on Zetia for you to take with Rosuvastatin.  Any questions? ?Keep being amazing!!  Thank you for allowing me to participate in your care.  I appreciate you. ?Kindest regards, ?Zoejane Gaulin ? ?

## 2021-10-01 IMAGING — CR DG CHEST 2V
1 series · 2 of 2 positions shown · non-contrast
Comparison: None.

CLINICAL DATA: Cough 5 days with wheezing and congestion.

EXAM:
CHEST - 2 VIEW

[Series 1: dg chest 2 view · 0.14mm/px · 2 of 2 slices shown]
[im 1/2]
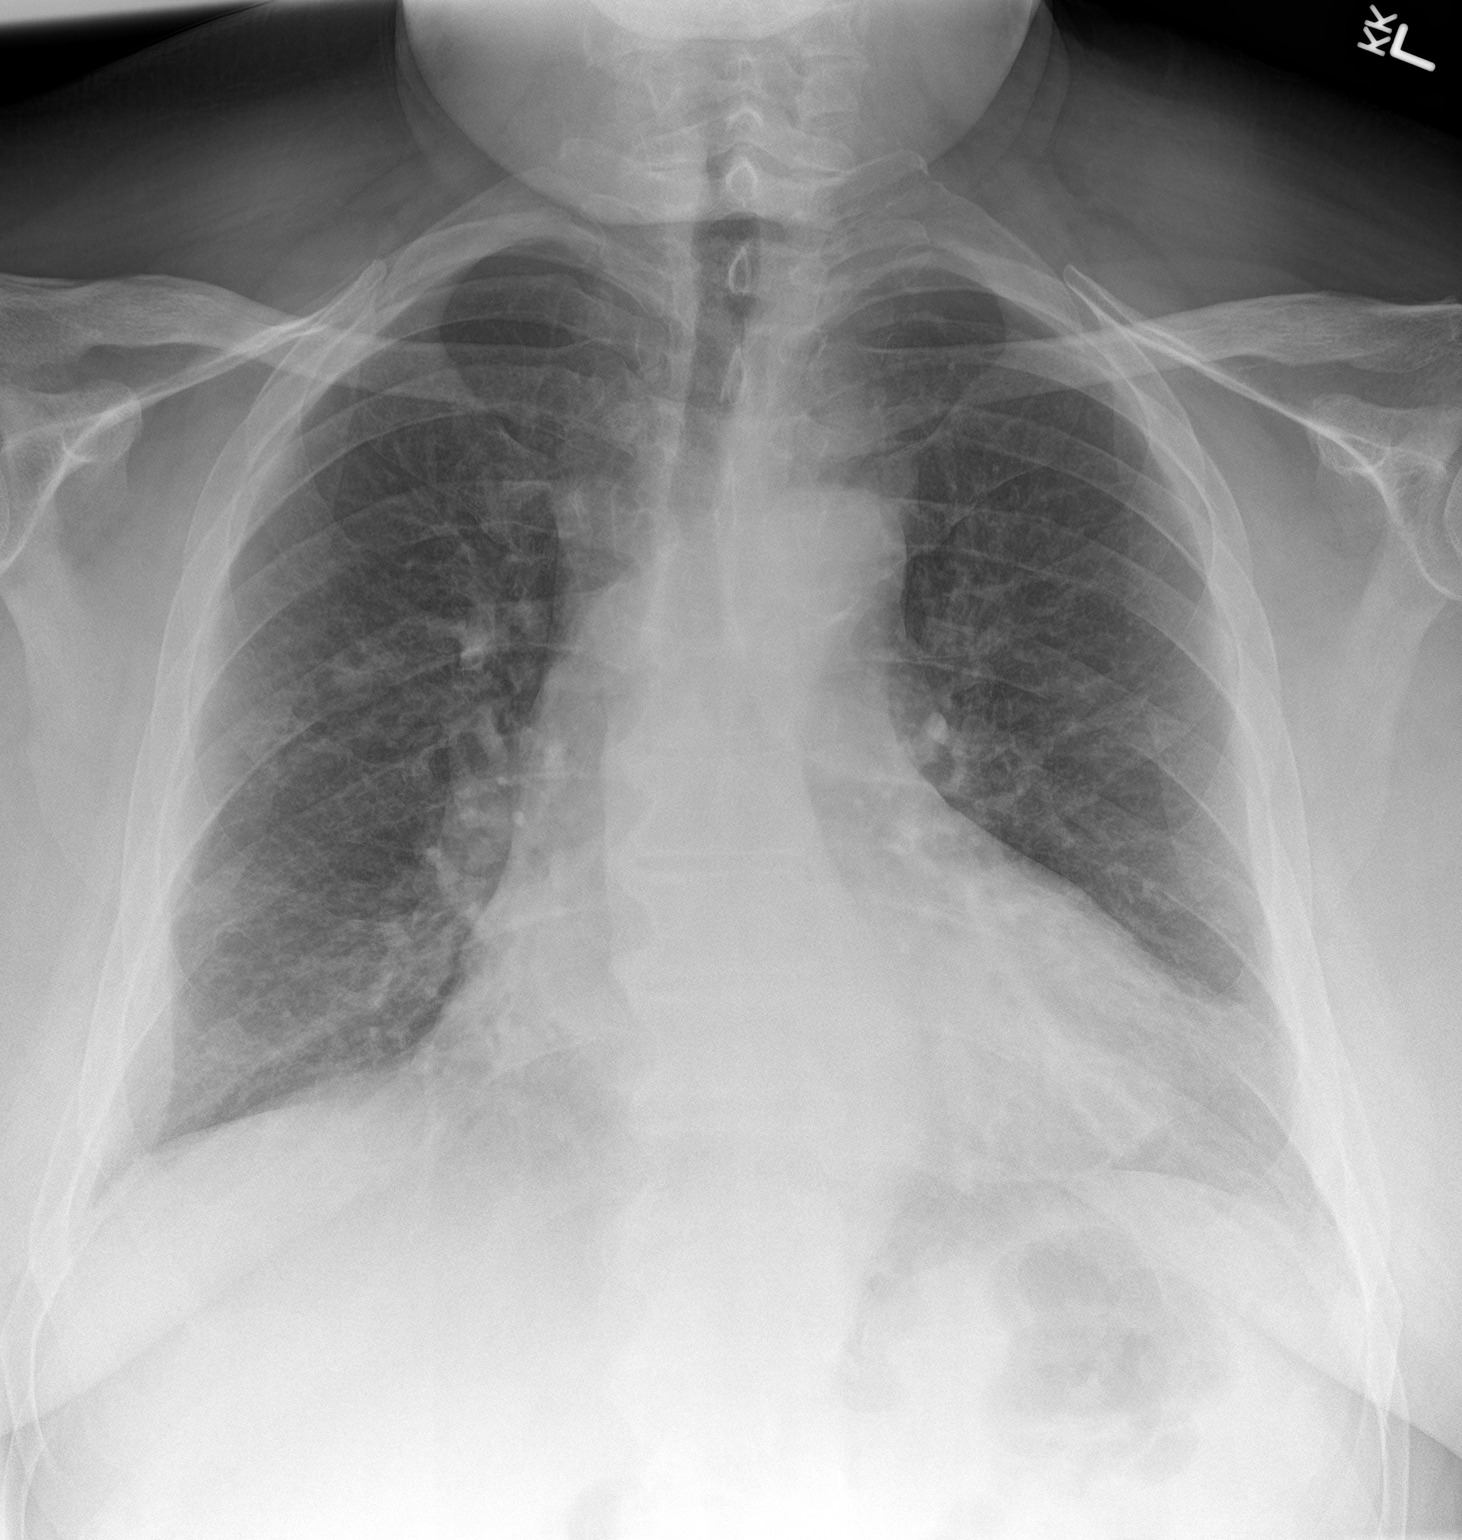
[im 2/2]
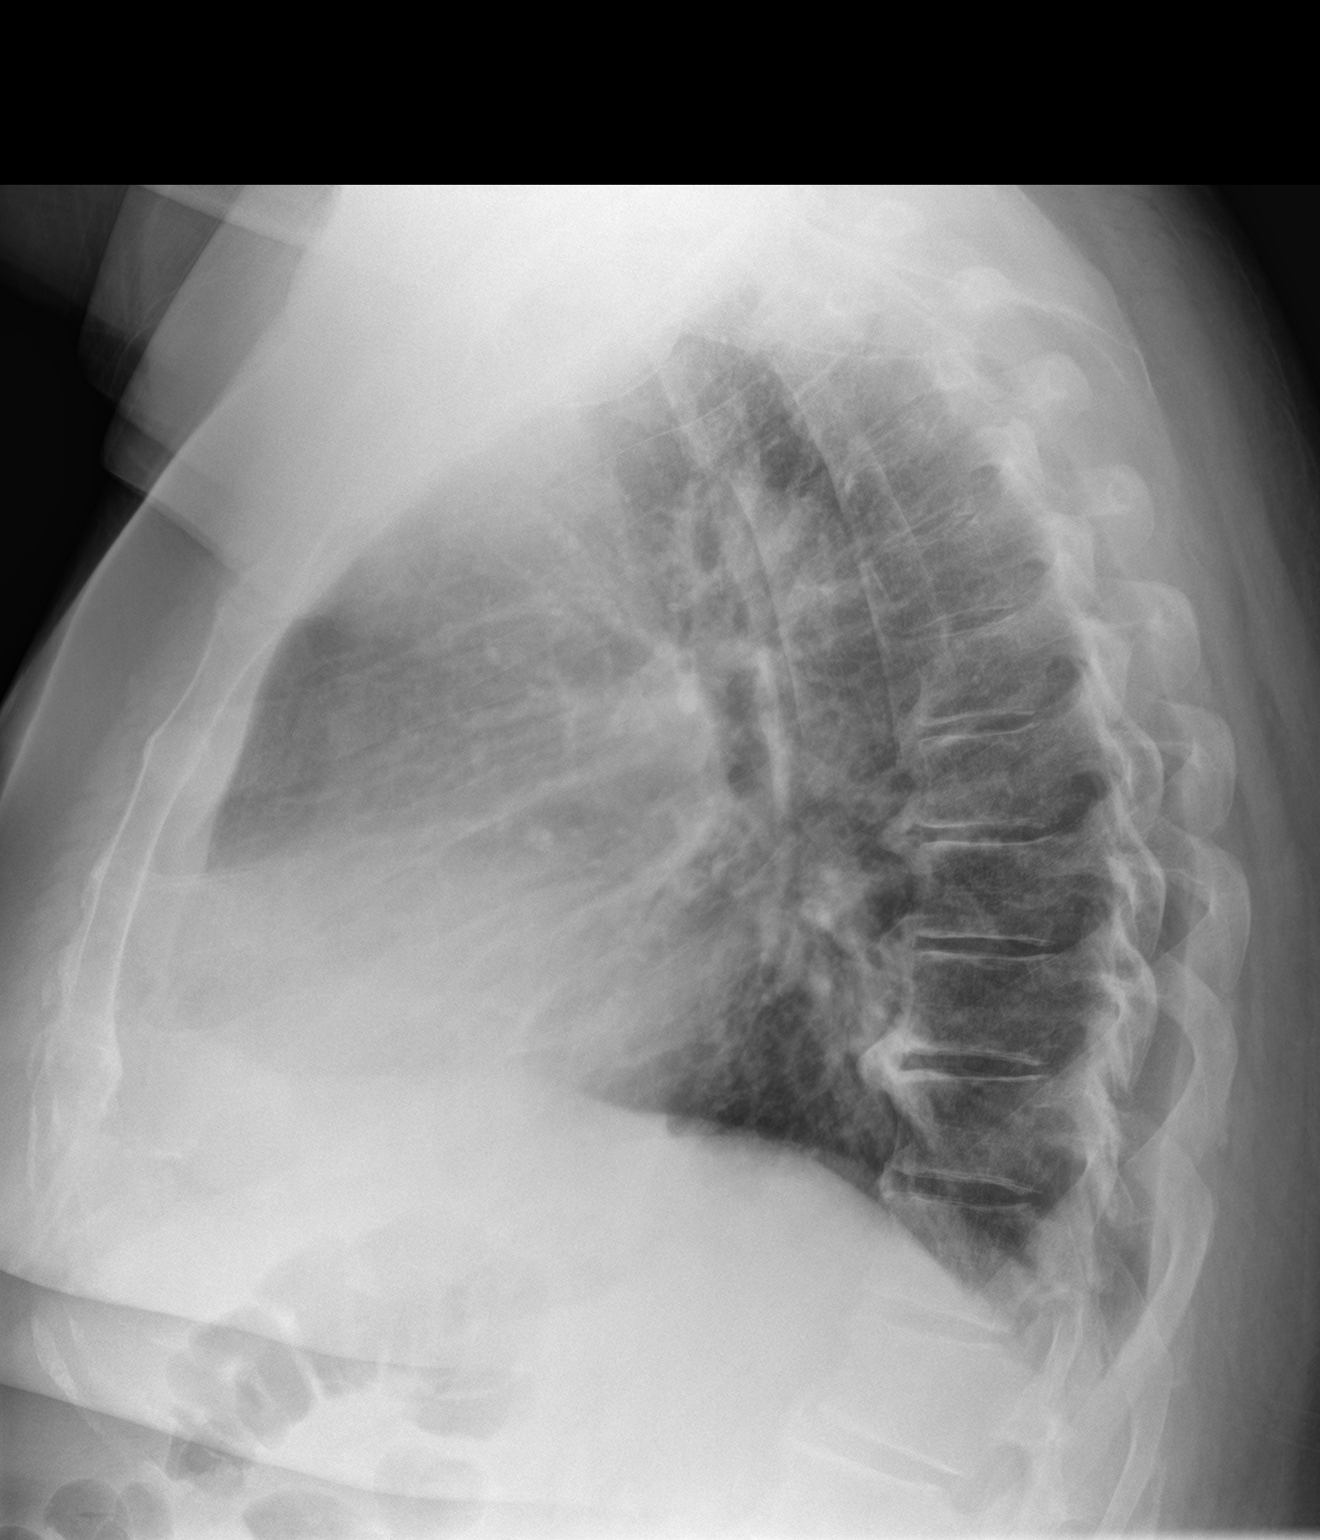

[2 of 2 positions shown; findings below may reference images not displayed]

FINDINGS: Lungs are adequately inflated without lobar consolidation or
effusion. Subtle hazy opacification over the perihilar regions which
may be due to mild vascular congestion and less likely atypical
infection or inflammatory process. Mild cardiomegaly. Degenerative
change of the spine.
IMPRESSION: Subtle hazy perihilar opacification which may be due to mild
vascular congestion and less likely atypical infection or
inflammatory process. Recommend follow-up chest radiograph 2-3
weeks.

## 2021-10-28 ENCOUNTER — Other Ambulatory Visit: Payer: Self-pay | Admitting: Nurse Practitioner

## 2021-11-01 NOTE — Telephone Encounter (Signed)
Requested Prescriptions  Pending Prescriptions Disp Refills  . gabapentin (NEURONTIN) 300 MG capsule [Pharmacy Med Name: GABAPENTIN 300 MG CAPSULE] 90 capsule 0    Sig: TAKE 1 CAPSULE BY MOUTH EVERY DAY     Neurology: Anticonvulsants - gabapentin Passed - 10/28/2021 10:52 AM      Passed - Cr in normal range and within 360 days    Creatinine, Ser  Date Value Ref Range Status  09/23/2021 1.14 0.76 - 1.27 mg/dL Final         Passed - Completed PHQ-2 or PHQ-9 in the last 360 days      Passed - Valid encounter within last 12 months    Recent Outpatient Visits          1 month ago Aortic atherosclerosis ([place])   [place], Jolene T, NP   7 months ago Intraparenchymal hemorrhage of brain ([place])   [place], Jolene T, NP   9 months ago Intraparenchymal hemorrhage of brain ([place])   [place], Barbaraann Faster, NP   10 months ago Cough   [place], [place] T, NP   10 months ago Cough   [place], Barbaraann Faster, NP      Future Appointments            In 4 months Cannady, Barbaraann Faster, NP [facility]

## 2021-12-03 ENCOUNTER — Other Ambulatory Visit: Payer: Self-pay | Admitting: Nurse Practitioner

## 2021-12-03 NOTE — Telephone Encounter (Signed)
Requested Prescriptions  Pending Prescriptions Disp Refills  . allopurinol (ZYLOPRIM) 100 MG tablet [Pharmacy Med Name: ALLOPURINOL 100 MG TABLET] 270 tablet 0    Sig: TAKE 3 TABLETS BY MOUTH DAILY     Endocrinology:  Gout Agents - allopurinol Passed - 12/03/2021  1:59 AM      Passed - Uric Acid in normal range and within 360 days    Uric Acid  Date Value Ref Range Status  01/20/2021 7.7 3.8 - 8.4 mg/dL Final    Comment:               Therapeutic target for gout patients: <6.0         Passed - Cr in normal range and within 360 days    Creatinine, Ser  Date Value Ref Range Status  09/23/2021 1.14 0.76 - 1.27 mg/dL Final         Passed - Valid encounter within last 12 months    Recent Outpatient Visits          2 months ago Aortic atherosclerosis ([place])   [place], Jolene T, NP   8 months ago Intraparenchymal hemorrhage of brain ([place])   [place] Cannady, Jolene T, NP   10 months ago Intraparenchymal hemorrhage of brain ([place])   [place] Cannady, Barbaraann Faster, NP   11 months ago Cough   [place], [place] T, NP   11 months ago Cough   [place], [place] T, NP      Future Appointments            In 3 months Cannady, [place] T, NP [facility]           Passed - CBC within normal limits and completed in the last 12 months    WBC  Date Value Ref Range Status  01/20/2021 6.4 3.4 - 10.8 x10E3/uL Final  01/15/2019 4.5 4.0 - 10.5 K/uL Final   RBC  Date Value Ref Range Status  01/20/2021 4.84 4.14 - 5.80 x10E6/uL Final  01/15/2019 4.82 4.22 - 5.81 MIL/uL Final   Hemoglobin  Date Value Ref Range Status  01/20/2021 13.4 13.0 - 17.7 g/dL Final   Hematocrit  Date Value Ref Range Status  01/20/2021 40.8 37.5 - 51.0 % Final   MCHC  Date Value Ref Range Status  01/20/2021 32.8 31.5 - 35.7 g/dL Final  01/15/2019 33.3 30.0 - 36.0 g/dL Final   [facility]  Date Value Ref  Range Status  01/20/2021 27.7 26.6 - 33.0 pg Final  01/15/2019 28.6 26.0 - 34.0 pg Final   MCV  Date Value Ref Range Status  01/20/2021 84 79 - 97 fL Final   No results found for: "PLTCOUNTKUC", "LABPLAT", "POCPLA" RDW  Date Value Ref Range Status  01/20/2021 15.3 11.6 - 15.4 % Final

## 2022-02-27 ENCOUNTER — Other Ambulatory Visit: Payer: Self-pay | Admitting: Nurse Practitioner

## 2022-02-28 NOTE — Telephone Encounter (Signed)
Requested Prescriptions  Pending Prescriptions Disp Refills  . allopurinol (ZYLOPRIM) 100 MG tablet [Pharmacy Med Name: ALLOPURINOL 100 MG TABLET] 270 tablet 0    Sig: TAKE 3 TABLETS BY MOUTH DAILY     Endocrinology:  Gout Agents - allopurinol Failed - 02/27/2022  9:16 AM      Failed - Uric Acid in normal range and within 360 days    Uric Acid  Date Value Ref Range Status  01/20/2021 7.7 3.8 - 8.4 mg/dL Final    Comment:               Therapeutic target for gout patients: <6.0         Failed - CBC within normal limits and completed in the last 12 months    WBC  Date Value Ref Range Status  01/20/2021 6.4 3.4 - 10.8 x10E3/uL Final  01/15/2019 4.5 4.0 - 10.5 K/uL Final   RBC  Date Value Ref Range Status  01/20/2021 4.84 4.14 - 5.80 x10E6/uL Final  01/15/2019 4.82 4.22 - 5.81 MIL/uL Final   Hemoglobin  Date Value Ref Range Status  01/20/2021 13.4 13.0 - 17.7 g/dL Final   Hematocrit  Date Value Ref Range Status  01/20/2021 40.8 37.5 - 51.0 % Final   MCHC  Date Value Ref Range Status  01/20/2021 32.8 31.5 - 35.7 g/dL Final  01/15/2019 33.3 30.0 - 36.0 g/dL Final   [facility]  Date Value Ref Range Status  01/20/2021 27.7 26.6 - 33.0 pg Final  01/15/2019 28.6 26.0 - 34.0 pg Final   MCV  Date Value Ref Range Status  01/20/2021 84 79 - 97 fL Final   No results found for: "PLTCOUNTKUC", "LABPLAT", "POCPLA" RDW  Date Value Ref Range Status  01/20/2021 15.3 11.6 - 15.4 % Final         Passed - Cr in normal range and within 360 days    Creatinine, Ser  Date Value Ref Range Status  09/23/2021 1.14 0.76 - 1.27 mg/dL Final         Passed - Valid encounter within last 12 months    Recent Outpatient Visits          5 months ago Aortic atherosclerosis ([place])   [place], Jolene T, NP   11 months ago Intraparenchymal hemorrhage of brain ([place])   [place], Jolene T, NP   1 year ago Intraparenchymal hemorrhage of brain ([place])    [place], Barbaraann Faster, NP   1 year ago Cough   [place], Barbaraann Faster, NP   1 year ago Cough   [place], Barbaraann Faster, NP      Future Appointments            In 3 weeks Cannady, Barbaraann Faster, NP [facility]

## 2022-03-18 ENCOUNTER — Other Ambulatory Visit: Payer: Self-pay | Admitting: Nurse Practitioner

## 2022-03-18 NOTE — Telephone Encounter (Signed)
Requested Prescriptions  Pending Prescriptions Disp Refills  . rosuvastatin (CRESTOR) 40 MG tablet [Pharmacy Med Name: ROSUVASTATIN CALCIUM 40 MG TAB] 24 tablet 0    Sig: TAKE 1 TABLET (40 MG TOTAL) BY MOUTH 2 (TWO) TIMES A WEEK.     Cardiovascular:  Antilipid - Statins 2 Failed - 03/18/2022  1:30 AM      Failed - Lipid Panel in normal range within the last 12 months    Cholesterol, Total  Date Value Ref Range Status  09/23/2021 148 100 - 199 mg/dL Final   Cholesterol [place], Waived  Date Value Ref Range Status  07/08/2019 100 <200 mg/dL Final    Comment:                            Desirable                <200                         Borderline High      200- 239                         High                     >239    LDL Chol Calc (NIH)  Date Value Ref Range Status  09/23/2021 81 0 - 99 mg/dL Final   HDL  Date Value Ref Range Status  09/23/2021 40 >39 mg/dL Final   Triglycerides  Date Value Ref Range Status  09/23/2021 156 (H) 0 - 149 mg/dL Final   Triglycerides Piccolo,Waived  Date Value Ref Range Status  07/08/2019 100 <150 mg/dL Final    Comment:                            Normal                   <150                         Borderline High     150 - 199                         High                200 - 499                         Very High                >499          Passed - Cr in normal range and within 360 days    Creatinine, Ser  Date Value Ref Range Status  09/23/2021 1.14 0.76 - 1.27 mg/dL Final         Passed - Patient is not pregnant      Passed - Valid encounter within last 12 months    Recent Outpatient Visits          5 months ago Aortic atherosclerosis ([place])   [place], Henrine [place] T, NP   1 year ago Intraparenchymal hemorrhage of brain ([place])   [place], Jolene T, NP   1 year ago Intraparenchymal hemorrhage of brain (  [place])   [place], Barbaraann Faster, NP   1 year ago Cough    [place], Barbaraann Faster, NP   1 year ago Cough   [place], Barbaraann Faster, NP      Future Appointments            In 1 week Cannady, Barbaraann Faster, NP [facility]

## 2022-03-20 NOTE — Patient Instructions (Signed)
Stroke Prevention Some medical conditions and lifestyle choices can lead to a higher risk for a stroke. You can help to prevent a stroke by eating healthy foods and exercising. It also helps to not smoke and to manage any health problems you may have. How can this condition affect me? A stroke is an emergency. It should be treated right away. A stroke can lead to brain damage or threaten your life. There is a better chance of surviving and getting better after a stroke if you get medical help right away. What can increase my risk? The following medical conditions may increase your risk of a stroke: Diseases of the heart and blood vessels (cardiovascular disease). High blood pressure (hypertension). Diabetes. High cholesterol. Sickle cell disease. Problems with blood clotting. Being very overweight. Sleeping problems (obstructivesleep apnea). Other risk factors include: Being older than age 60. A history of blood clots, stroke, or mini-stroke (TIA). Race, ethnic background, or a family history of stroke. Smoking or using tobacco products. Taking birth control pills, especially if you smoke. Heavy alcohol and drug use. Not being active. What actions can I take to prevent this? Manage your health conditions High cholesterol. Eat a healthy diet. If this is not enough to manage your cholesterol, you may need to take medicines. Take medicines as told by your doctor. High blood pressure. Try to keep your blood pressure below 130/80. If your blood pressure cannot be managed through a healthy diet and regular exercise, you may need to take medicines. Take medicines as told by your doctor. Ask your doctor if you should check your blood pressure at home. Have your blood pressure checked every year. Diabetes. Eat a healthy diet and get regular exercise. If your blood sugar (glucose) cannot be managed through diet and exercise, you may need to take medicines. Take medicines as told by your  doctor. Talk to your doctor about getting checked for sleeping problems. Signs of a problem can include: Snoring a lot. Feeling very tired. Make sure that you manage any other conditions you have. Nutrition  Follow instructions from your doctor about what to eat or drink. You may be told to: Eat and drink fewer calories each day. Limit how much salt (sodium) you use to 1,500 milligrams (mg) each day. Use only healthy fats for cooking, such as olive oil, canola oil, and sunflower oil. Eat healthy foods. To do this: Choose foods that are high in fiber. These include whole grains, and fresh fruits and vegetables. Eat at least 5 servings of fruits and vegetables a day. Try to fill one-half of your plate with fruits and vegetables at each meal. Choose low-fat (lean) proteins. These include low-fat cuts of meat, chicken without skin, fish, tofu, beans, and nuts. Eat low-fat dairy products. Avoid foods that: Are high in salt. Have saturated fat. Have trans fat. Have cholesterol. Are processed or pre-made. Count how many carbohydrates you eat and drink each day. Lifestyle If you drink alcohol: Limit how much you have to: 0-1 drink a day for women who are not pregnant. 0-2 drinks a day for men. Know how much alcohol is in your drink. In the [place] one drink equals one 12 oz bottle of beer (355mL), one 5 oz glass of wine (148mL), or one 1 oz glass of hard liquor (44mL). Do not smoke or use any products that have nicotine or tobacco. If you need help quitting, ask your doctor. Avoid secondhand smoke. Do not use drugs. Activity  Try to stay at a   healthy weight. Get at least 30 minutes of exercise on most days, such as: Fast walking. Biking. Swimming. Medicines Take over-the-counter and prescription medicines only as told by your doctor. Avoid taking birth control pills. Talk to your doctor about the risks of taking birth control pills if: You are over 35 years old. You smoke. You get  very bad headaches. You have had a blood clot. Where to find more information American Stroke Association: www.strokeassociation.org Get help right away if: You or a loved one has any signs of a stroke. "BE FAST" is an easy way to remember the warning signs: B - Balance. Dizziness, sudden trouble walking, or loss of balance. E - Eyes. Trouble seeing or a change in how you see. F - Face. Sudden weakness or loss of feeling of the face. The face or eyelid may droop on one side. A - Arms. Weakness or loss of feeling in an arm. This happens all of a sudden and most often on one side of the body. S - Speech. Sudden trouble speaking, slurred speech, or trouble understanding what people say. T - Time. Time to call emergency services. Write down what time symptoms started. You or a loved one has other signs of a stroke, such as: A sudden, very bad headache with no known cause. Feeling like you may vomit (nausea). Vomiting. A seizure. These symptoms may be an emergency. Get help right away. Call your local emergency services (911 in the [place] Do not wait to see if the symptoms will go away. Do not drive yourself to the hospital. Summary You can help to prevent a stroke by eating healthy, exercising, and not smoking. It also helps to manage any health problems you have. Do not smoke or use any products that contain nicotine or tobacco. Get help right away if you or a loved one has any signs of a stroke. This information is not intended to replace advice given to you by your health care provider. Make sure you discuss any questions you have with your health care provider. Document Revised: 12/23/2019 Document Reviewed: 12/23/2019 Elsevier Patient Education  2023 Elsevier Inc.  

## 2022-03-25 ENCOUNTER — Encounter: Payer: Self-pay | Admitting: Nurse Practitioner

## 2022-03-25 ENCOUNTER — Ambulatory Visit (INDEPENDENT_AMBULATORY_CARE_PROVIDER_SITE_OTHER): Payer: Commercial Managed Care - PPO | Admitting: Nurse Practitioner

## 2022-03-25 DIAGNOSIS — G4733 Obstructive sleep apnea (adult) (pediatric): Secondary | ICD-10-CM

## 2022-03-25 DIAGNOSIS — Z8673 Personal history of transient ischemic attack (TIA), and cerebral infarction without residual deficits: Secondary | ICD-10-CM

## 2022-03-25 DIAGNOSIS — Z23 Encounter for immunization: Secondary | ICD-10-CM

## 2022-03-25 DIAGNOSIS — I700 Atherosclerosis of aorta: Secondary | ICD-10-CM

## 2022-03-25 DIAGNOSIS — Z0000 Encounter for general adult medical examination without abnormal findings: Secondary | ICD-10-CM | POA: Diagnosis not present

## 2022-03-25 DIAGNOSIS — I10 Essential (primary) hypertension: Secondary | ICD-10-CM

## 2022-03-25 DIAGNOSIS — E783 Hyperchylomicronemia: Secondary | ICD-10-CM

## 2022-03-25 DIAGNOSIS — N400 Enlarged prostate without lower urinary tract symptoms: Secondary | ICD-10-CM

## 2022-03-25 DIAGNOSIS — R7989 Other specified abnormal findings of blood chemistry: Secondary | ICD-10-CM

## 2022-03-25 DIAGNOSIS — R7301 Impaired fasting glucose: Secondary | ICD-10-CM

## 2022-03-25 DIAGNOSIS — M1A0720 Idiopathic chronic gout, left ankle and foot, without tophus (tophi): Secondary | ICD-10-CM

## 2022-03-25 LAB — BAYER DCA HB A1C WAIVED: HB A1C (BAYER DCA - WAIVED): 6.1 % — ABNORMAL HIGH (ref 4.8–5.6)

## 2022-03-25 MED ORDER — TELMISARTAN 80 MG PO TABS: 80.0000 mg | ORAL_TABLET | Freq: Every day | ORAL | 4 refills | Status: DC

## 2022-03-25 MED ORDER — AMLODIPINE BESYLATE 10 MG PO TABS: 10.0000 mg | ORAL_TABLET | Freq: Every day | ORAL | 4 refills | Status: DC

## 2022-03-25 MED ORDER — CHLORTHALIDONE 25 MG PO TABS: 25.0000 mg | ORAL_TABLET | Freq: Every day | ORAL | 4 refills | Status: DC

## 2022-03-25 MED ORDER — ROSUVASTATIN CALCIUM 40 MG PO TABS: 40.0000 mg | ORAL_TABLET | ORAL | 4 refills | Status: DC

## 2022-03-25 MED ORDER — ALLOPURINOL 100 MG PO TABS: 300.0000 mg | ORAL_TABLET | Freq: Every day | ORAL | 4 refills | Status: DC

## 2022-03-25 NOTE — Assessment & Plan Note (Signed)
Chronic, ongoing.  Continue current medication regimen and adjust as needed -- is on twice weekly Crestor due to myalgia with increased doses -- could consider addition of Zetia.  Will plan on repeat CMP and lipids. Educated him on stroke prevention goals: BP <130/90 LDL <70 A1C <6.5

## 2022-03-25 NOTE — Assessment & Plan Note (Signed)
Chronic, stable .  BP at goal at home and in office.  Continue current medication regimen and recommend DASH diet at home, lowering sodium in diet to avoid fluid retention. Adjust regimen as needed.  Continue to collaborate with cardiology as needed.  Labs: CMP, CBC.  Will further consider discontinuation of Chlorthalidone if ongoing gout flares and possibly need to start Hydralazine or Clonidine -- avoid BB due to low resting HR.  Return to office in 6 months.

## 2022-03-25 NOTE — Assessment & Plan Note (Signed)
BMI 45.15 with HTN/HLD, IFG. Recommended eating smaller high protein, low fat meals more frequently and exercising 30 mins a day 5 times a week with a goal of 10-15lb weight loss in the next 3 months. Patient voiced their understanding and motivation to adhere to these recommendations.

## 2022-03-25 NOTE — Assessment & Plan Note (Signed)
Chronic, ongoing.  Check uric acid level today and continue current medication regimen.  Colchicine for use during acute flares and Gabapentin for his chronic knee pain as needed. Consider discontinuation of Chlorthalidone if ongoing flares.

## 2022-03-25 NOTE — Progress Notes (Signed)
BP 128/72   Pulse (!) 58   Temp 98 F (36.7 C) (Oral)   Ht '5\' 11"'$  (1.803 m)   Wt (!) 323 lb 11.2 oz (146.8 kg)   SpO2 94%   BMI 45.15 kg/m    Subjective:    Patient ID: Paul Cannon, male    DOB: 11-01-1960, 61 y.o.   MRN: [id number]  HPI: Paul Cannon is a 61 y.o. male presenting on 03/25/2022 for comprehensive medical examination. Current medical complaints include:none  He currently lives with: wife Interim Problems from his last visit: no   Reports his wife has concerns for depression, but he reports he is not depressed. Denies SI/HI.  She is concerned his "want to" is not there, however now does not care as much.  He would like new dermatologist and new OSA provider, not happy with current providers.  HYPERTENSION / [place] Seeing cardiology and neurology + oncology at this time.  Last visit with cardiology 10/17/2019 (no changes). To return as needed only. Continues on Chlorthalidone, Micardis, Amlodipine, and Crestor 2-3 days a week (did not tolerate daily dosing)  Intraparenchymal hemorrhage diagnosed 11/03/20.  Last saw neuro 11/28/20 and oncology 03/12/21 due to concern for mass on imaging.  History of CVA, lacunar infarcts. Had past MYO screen, normal with EF 55-65%.  Has OSA, he continues to use CPAP nightly.  Goes to [employer] for supplies and downloads, but feels he needs a new location for OSA. Satisfied with current treatment? yes Duration of hypertension: chronic BP monitoring frequency: occasionally BP range: 110-130/80 range at home BP medication side effects: no Duration of hyperlipidemia: chronic Cholesterol medication side effects: no Cholesterol supplements: none Medication compliance: good compliance Aspirin: yes Recent stressors: no Recurrent headaches: no Visual changes: no Palpitations: no Dyspnea: no Chest pain: no Lower extremity edema: no Dizzy/lightheaded: no  The 10-year ASCVD risk score (Arnett DK, et al., 2019) is:  9.8%   Values used to calculate the score:     Age: 88 years     Sex: Male     Is Non-Hispanic African American: No     Diabetic: No     Tobacco smoker: No     Systolic Blood Pressure: 740 mmHg     Is BP treated: Yes     HDL Cholesterol: 40 mg/dL     Total Cholesterol: 148 mg/dL    PREDIABETES: A1c recently 6.1%. Polydipsia/polyuria: no Visual disturbance: no Chest pain: no Paresthesias: no  GOUT Continues to take Allopurinol daily + takes Colchicine as needed. Duration:chronic Right 1st metatarsophalangeal pain: no Left 1st metatarsophalangeal pain: no Right knee pain: no Left knee pain: no Severity: none current Swelling: no Redness: no Trauma: no Recent dietary change or indiscretion: no Fevers: no Nausea/vomiting: no Aggravating factors: varies Alleviating factors: Colchicine Status:  stable Treatments attempted: Allopurinol and Colchicine  Functional Status Survey: Is the patient deaf or have difficulty hearing?: No Does the patient have difficulty seeing, even when wearing glasses/contacts?: No Does the patient have difficulty concentrating, remembering, or making decisions?: No Does the patient have difficulty walking or climbing stairs?: No Does the patient have difficulty dressing or bathing?: No Does the patient have difficulty doing errands alone such as visiting a doctor's office or shopping?: No  FALL RISK:    03/25/2022   10:14 AM 09/23/2021   10:36 AM 01/20/2021    8:08 AM 01/17/2020    9:08 AM 01/25/2018    8:55 AM  Fall Risk   Falls in the past  year? 0 0 0 0 No  Number falls in past yr: 0 0 0 0   Injury with Fall? 0 0 0 0   Risk for fall due to : No Fall Risks No Fall Risks No Fall Risks No Fall Risks   Follow up Falls evaluation completed Falls evaluation completed Falls evaluation completed Falls evaluation completed     Depression Screen    03/25/2022   10:14 AM 09/23/2021   10:36 AM 01/20/2021    8:20 AM 11/03/2020    2:18 PM 01/17/2020     9:08 AM  Depression screen PHQ 2/9  Decreased Interest 0 0 2 1 0  Down, Depressed, Hopeless 0 0 0 1 0  PHQ - 2 Score 0 0 2 2 0  Altered sleeping 0 0 1 0   Tired, decreased energy 0 0 1 2   Change in appetite 0 0 0 0   Feeling bad or failure about yourself  0 0 0 0   Trouble concentrating 0 0 0 0   Moving slowly or fidgety/restless 0 0 0 1   Suicidal thoughts 0 0 0 0   PHQ-9 Score 0 0 4 5   Difficult doing work/chores Not difficult at all Not difficult at all Not difficult at all Somewhat difficult    Past Medical History:  Past Medical History:  Diagnosis Date   Brain mass    Gout 2022   Hyperlipidemia    Hypertension    Obesity    Sleep apnea     Surgical History:  Past Surgical History:  Procedure Laterality Date   COLONOSCOPY WITH PROPOFOL N/A 11/17/2015   Procedure: COLONOSCOPY WITH PROPOFOL;  Surgeon: [name], MD;  Location: [facility] ENDOSCOPY;  Service: Endoscopy;  Laterality: N/A;   DENTAL SURGERY  05/2021   SHOULDER SURGERY      Medications:  Current Outpatient Medications on File Prior to Visit  Medication Sig   colchicine 0.6 MG tablet TAKE 1 TABLET (0.6 MG TOTAL) BY MOUTH DAILY. USE ONLY WHEN ACUTE FLARE OF GOUT PRESENT.   cyclobenzaprine (FLEXERIL) 10 MG tablet TAKE 1 TABLET BY MOUTH EVERY DAY AT BEDTIME AS NEEDED   gabapentin (NEURONTIN) 300 MG capsule TAKE 1 CAPSULE BY MOUTH EVERY DAY   Multiple Vitamins-Minerals (ONE-A-DAY MENS 50+ ADVANTAGE PO) Take 1 tablet by mouth daily.   No current facility-administered medications on file prior to visit.    Allergies:  Allergies  Allergen Reactions   Percocet [Oxycodone-Acetaminophen] Diarrhea and Nausea And Vomiting   Vicodin [Hydrocodone-Acetaminophen] Diarrhea and Nausea And Vomiting    Social History:  Social History   Socioeconomic History   Marital status: Married    Spouse name: Not on file   Number of children: Not on file   Years of education: Not on file   Highest education level: Not on  file  Occupational History   Not on file  Tobacco Use   Smoking status: Former    Types: Cigarettes    Quit date: 10/01/2010    Years since quitting: 11.4   Smokeless tobacco: Former  Scientific laboratory technician Use: Never used  Substance and Sexual Activity   Alcohol use: Yes    Comment: pt states occasional or very rarely   Drug use: No   Sexual activity: Yes  Other Topics Concern   Not on file  Social History Narrative   Not on file   Social Determinants of Health   Financial Resource Strain: Not on file  Food  Insecurity: Not on file  Transportation Needs: Not on file  Physical Activity: Not on file  Stress: Not on file  Social Connections: Not on file  Intimate Partner Violence: Not on file   Social History   Tobacco Use  Smoking Status Former   Types: Cigarettes   Quit date: 10/01/2010   Years since quitting: 11.4  Smokeless Tobacco Former   Social History   Substance and Sexual Activity  Alcohol Use Yes   Comment: pt states occasional or very rarely    Family History:  Family History  Problem Relation Age of Onset   Arthritis Mother    Hyperlipidemia Mother    Hypertension Mother    Migraines Mother    Heart disease Father        MI   Stroke Father    Lung disease Father    Cancer Sister        breast   Cancer Brother        melanoma   Stroke Sister     Past medical history, surgical history, medications, allergies, family history and social history reviewed with patient today and changes made to appropriate areas of the chart.   Review of Systems -  negative All other ROS negative except what is listed above and in the HPI.      Objective:    BP 128/72   Pulse (!) 58   Temp 98 F (36.7 C) (Oral)   Ht '5\' 11"'$  (1.803 m)   Wt (!) 323 lb 11.2 oz (146.8 kg)   SpO2 94%   BMI 45.15 kg/m   Wt Readings from Last 3 Encounters:  03/25/22 (!) 323 lb 11.2 oz (146.8 kg)  09/23/21 (!) 300 lb 9.6 oz (136.4 kg)  05/03/21 (!) 301 lb (136.5 kg)     Physical Exam Vitals and nursing note reviewed.  Constitutional:      General: He is awake. He is not in acute distress.    Appearance: He is well-developed and well-groomed. He is morbidly obese. He is not ill-appearing.  HENT:     Head: Normocephalic and atraumatic.     Right Ear: Hearing, tympanic membrane, ear canal and external ear normal. No drainage.     Left Ear: Hearing, tympanic membrane, ear canal and external ear normal. No drainage.     Nose: Nose normal.     Mouth/Throat:     Pharynx: Uvula midline.  Eyes:     General: Lids are normal.        Right eye: No discharge.        Left eye: No discharge.     Extraocular Movements: Extraocular movements intact.     Conjunctiva/sclera: Conjunctivae normal.     Pupils: Pupils are equal, round, and reactive to light.     Visual Fields: Right eye visual fields normal and left eye visual fields normal.  Neck:     Thyroid: No thyromegaly.     Vascular: No carotid bruit or JVD.     Trachea: Trachea normal.  Cardiovascular:     Rate and Rhythm: Normal rate and regular rhythm.     Heart sounds: Normal heart sounds, S1 normal and S2 normal. No murmur heard.    No gallop.  Pulmonary:     Effort: Pulmonary effort is normal. No accessory muscle usage or respiratory distress.     Breath sounds: Normal breath sounds.  Abdominal:     General: Bowel sounds are normal.     Palpations: Abdomen is  soft. There is no hepatomegaly or splenomegaly.     Tenderness: There is no abdominal tenderness.  Genitourinary:    Prostate: Enlarged (mild). Not tender and no nodules present.  Musculoskeletal:        General: Normal range of motion.     Cervical back: Normal range of motion and neck supple.     Right lower leg: 1+ Edema present.     Left lower leg: 1+ Edema present.  Lymphadenopathy:     Head:     Right side of head: No submental, submandibular, tonsillar, preauricular or posterior auricular adenopathy.     Left side of head: No  submental, submandibular, tonsillar, preauricular or posterior auricular adenopathy.     Cervical: No cervical adenopathy.  Skin:    General: Skin is warm and dry.     Capillary Refill: Capillary refill takes less than 2 seconds.     Findings: No rash.  Neurological:     Mental Status: He is alert and oriented to person, place, and time.     Gait: Gait is intact.     Deep Tendon Reflexes: Reflexes are normal and symmetric.     Reflex Scores:      Brachioradialis reflexes are 2+ on the right side and 2+ on the left side.      Patellar reflexes are 2+ on the right side and 2+ on the left side. Psychiatric:        Attention and Perception: Attention normal.        Mood and Affect: Mood normal.        Speech: Speech normal.        Behavior: Behavior normal. Behavior is cooperative.        Thought Content: Thought content normal.        Cognition and Memory: Cognition normal.        Judgment: Judgment normal.    Results for orders placed or performed in visit on 03/25/22  Bayer DCA Hb A1c Waived  Result Value Ref Range   HB A1C (BAYER DCA - WAIVED) 6.1 (H) 4.8 - 5.6 %      Assessment & Plan:   Problem List Items Addressed This Visit       Cardiovascular and Mediastinum   Aortic atherosclerosis (HCC)    Chronic.  Noted on CXR 12/25/20.  Recommend continuation of statin daily, avoid ASA at this time due to recent bleed.      Relevant Medications   chlorthalidone (HYGROTON) 25 MG tablet   rosuvastatin (CRESTOR) 40 MG tablet (Start on 03/28/2022)   telmisartan (MICARDIS) 80 MG tablet   amLODipine (NORVASC) 10 MG tablet   Other Relevant Orders   Comprehensive metabolic panel   Lipid Panel w/o Chol/HDL Ratio   Hypertension    Chronic, stable .  BP at goal at home and in office.  Continue current medication regimen and recommend DASH diet at home, lowering sodium in diet to avoid fluid retention. Adjust regimen as needed.  Continue to collaborate with cardiology as needed.  Labs:  CMP, CBC.  Will further consider discontinuation of Chlorthalidone if ongoing gout flares and possibly need to start Hydralazine or Clonidine -- avoid BB due to low resting HR.  Return to office in 6 months.      Relevant Medications   chlorthalidone (HYGROTON) 25 MG tablet   rosuvastatin (CRESTOR) 40 MG tablet (Start on 03/28/2022)   telmisartan (MICARDIS) 80 MG tablet   amLODipine (NORVASC) 10 MG tablet   Other Relevant  Orders   CBC with Differential/Platelet   Comprehensive metabolic panel   TSH     Respiratory   OSA on CPAP    Chronic, stable -- continue 100% use of CPAP at home.        Relevant Orders   CBC with Differential/Platelet     Endocrine   IFG (impaired fasting glucose)    Ongoing and monitored -- diet focus, A1c trending down today to 5.7%.      Relevant Orders   Bayer DCA Hb A1c Waived (Completed)     Musculoskeletal and Integument   Chronic idiopathic gout involving toe of left foot without tophus    Chronic, ongoing.  Check uric acid level today and continue current medication regimen.  Colchicine for use during acute flares and Gabapentin for his chronic knee pain as needed. Consider discontinuation of Chlorthalidone if ongoing flares.        Relevant Medications   allopurinol (ZYLOPRIM) 100 MG tablet   Other Relevant Orders   Uric acid     Other   Elevated LFTs    Recheck CMP today.      Relevant Orders   Comprehensive metabolic panel   History of stroke    In August 2020 and intraparenchymal bleed June 2022. Continue statin daily and avoid ASA due to recent bleed.  BP at goal today.  Check A1c, CMP, CBC.      Relevant Orders   Comprehensive metabolic panel   Lipid Panel w/o Chol/HDL Ratio   Hyperlipidemia    Chronic, ongoing.  Continue current medication regimen and adjust as needed -- is on twice weekly Crestor due to myalgia with increased doses -- could consider addition of Zetia.  Will plan on repeat CMP and lipids. Educated him on  stroke prevention goals: BP <130/90 LDL <70 A1C <6.5      Relevant Medications   chlorthalidone (HYGROTON) 25 MG tablet   rosuvastatin (CRESTOR) 40 MG tablet (Start on 03/28/2022)   telmisartan (MICARDIS) 80 MG tablet   amLODipine (NORVASC) 10 MG tablet   Other Relevant Orders   Comprehensive metabolic panel   Lipid Panel w/o Chol/HDL Ratio   Morbid obesity ([place]) - Primary    BMI 45.15 with HTN/HLD, IFG. Recommended eating smaller high protein, low fat meals more frequently and exercising 30 mins a day 5 times a week with a goal of 10-15lb weight loss in the next 3 months. Patient voiced their understanding and motivation to adhere to these recommendations.       Other Visit Diagnoses     Benign prostatic hyperplasia without lower urinary tract symptoms       PSA on labs today.   Relevant Orders   PSA   Flu vaccine need       Flu vaccine in office today.   Relevant Orders   Flu Vaccine QUAD 6+ mos PF IM (Fluarix Quad PF) (Completed)   Encounter for annual physical exam       Annual physical today with labs and health maintenance reviewed, discussed with patient.        Discussed aspirin prophylaxis for myocardial infarction prevention and decision was made to continue ASA  LABORATORY TESTING:  Health maintenance labs ordered today as discussed above.   The natural history of prostate cancer and ongoing controversy regarding screening and potential treatment outcomes of prostate cancer has been discussed with the patient. The meaning of a false positive PSA and a false negative PSA has been discussed. He indicates understanding of  the limitations of this screening test and wishes  to proceed with screening PSA testing.   IMMUNIZATIONS:   - Tdap: Tetanus vaccination status reviewed: last tetanus booster within 10 years. - Influenza: Up to date - Pneumovax: Not applicable - Prevnar: Not applicable - Zostavax vaccine: Refused  SCREENING: - Colonoscopy: Up to date   Discussed with patient purpose of the colonoscopy is to detect colon cancer at curable precancerous or early stages   - AAA Screening: Not applicable  -Hearing Test: Not applicable  -Spirometry: Not applicable   PATIENT COUNSELING:    Sexuality: Discussed sexually transmitted diseases, partner selection, use of condoms, avoidance of unintended pregnancy  and contraceptive alternatives.   Advised to avoid cigarette smoking.  I discussed with the patient that most people either abstain from alcohol or drink within safe limits (<=14/week and <=4 drinks/occasion for males, <=7/weeks and <= 3 drinks/occasion for females) and that the risk for alcohol disorders and other health effects rises proportionally with the number of drinks per week and how often a drinker exceeds daily limits.  Discussed cessation/primary prevention of drug use and availability of treatment for abuse.   Diet: Encouraged to adjust caloric intake to maintain  or achieve ideal body weight, to reduce intake of dietary saturated fat and total fat, to limit sodium intake by avoiding high sodium foods and not adding table salt, and to maintain adequate dietary potassium and calcium preferably from fresh fruits, vegetables, and low-fat dairy products.    Stressed the importance of regular exercise  Injury prevention: Discussed safety belts, safety helmets, smoke detector, smoking near bedding or upholstery.   Dental health: Discussed importance of regular tooth brushing, flossing, and dental visits.   Follow up plan: NEXT PREVENTATIVE PHYSICAL DUE IN 1 YEAR. Return in about 6 months (around 09/24/2022) for HTN/HLD, Prediabetes.

## 2022-03-25 NOTE — Assessment & Plan Note (Signed)
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 Continue statin daily and avoid ASA due to recent bleed.  BP at goal today.  Check A1c, CMP, CBC.

## 2022-03-25 NOTE — Assessment & Plan Note (Signed)
Chronic, stable -- continue 100% use of CPAP at home.

## 2022-03-25 NOTE — Assessment & Plan Note (Addendum)
Ongoing and monitored -- diet focus, A1c trending down today to 5.7%.

## 2022-03-25 NOTE — Assessment & Plan Note (Signed)
Chronic.  Noted on CXR 12/25/20.  Recommend continuation of statin daily, avoid ASA at this time due to recent bleed.

## 2022-03-25 NOTE — Assessment & Plan Note (Signed)
Recheck CMP today.  °

## 2022-03-26 LAB — COMPREHENSIVE METABOLIC PANEL
ALT: 76 IU/L — ABNORMAL HIGH (ref 0–44)
AST: 57 IU/L — ABNORMAL HIGH (ref 0–40)
Albumin/Globulin Ratio: 1.7 (ref 1.2–2.2)
Albumin: 4.2 g/dL (ref 3.9–4.9)
Alkaline Phosphatase: 73 IU/L (ref 44–121)
BUN/Creatinine Ratio: 15 (ref 10–24)
BUN: 17 mg/dL (ref 8–27)
Bilirubin Total: 0.4 mg/dL (ref 0.0–1.2)
CO2: 24 mmol/L (ref 20–29)
Calcium: 9.5 mg/dL (ref 8.6–10.2)
Chloride: 102 mmol/L (ref 96–106)
Creatinine, Ser: 1.15 mg/dL (ref 0.76–1.27)
Globulin, Total: 2.5 g/dL (ref 1.5–4.5)
Glucose: 123 mg/dL — ABNORMAL HIGH (ref 70–99)
Potassium: 3.8 mmol/L (ref 3.5–5.2)
Sodium: 143 mmol/L (ref 134–144)
Total Protein: 6.7 g/dL (ref 6.0–8.5)
eGFR: 72 mL/min/{1.73_m2} (ref >59)

## 2022-03-26 LAB — CBC WITH DIFFERENTIAL/PLATELET
Basophils Absolute: 0 10*3/uL (ref 0.0–0.2)
Basos: 0 %
EOS (ABSOLUTE): 0.2 10*3/uL (ref 0.0–0.4)
Eos: 3 %
Hematocrit: 43.9 % (ref 37.5–51.0)
Hemoglobin: 14.7 g/dL (ref 13.0–17.7)
Immature Grans (Abs): 0 10*3/uL (ref 0.0–0.1)
Immature Granulocytes: 0 %
Lymphocytes Absolute: 1.4 10*3/uL (ref 0.7–3.1)
Lymphs: 26 %
MCH: 28.7 pg (ref 26.6–33.0)
MCHC: 33.5 g/dL (ref 31.5–35.7)
MCV: 86 fL (ref 79–97)
Monocytes Absolute: 0.5 10*3/uL (ref 0.1–0.9)
Monocytes: 9 %
Neutrophils Absolute: 3.2 10*3/uL (ref 1.4–7.0)
Neutrophils: 62 %
Platelets: 199 10*3/uL (ref 150–450)
RBC: 5.12 x10E6/uL (ref 4.14–5.80)
RDW: 13.2 % (ref 11.6–15.4)
WBC: 5.2 10*3/uL (ref 3.4–10.8)

## 2022-03-26 LAB — LIPID PANEL W/O CHOL/HDL RATIO
Cholesterol, Total: 189 mg/dL (ref 100–199)
HDL: 38 mg/dL — ABNORMAL LOW (ref >39)
LDL Chol Calc (NIH): 113 mg/dL — ABNORMAL HIGH (ref 0–99)
Triglycerides: 216 mg/dL — ABNORMAL HIGH (ref 0–149)
VLDL Cholesterol Cal: 38 mg/dL (ref 5–40)

## 2022-03-26 LAB — URIC ACID: Uric Acid: 7 mg/dL (ref 3.8–8.4)

## 2022-03-26 LAB — PSA: Prostate Specific Ag, Serum: 1.3 ng/mL (ref 0.0–4.0)

## 2022-03-26 LAB — TSH: TSH: 3.52 u[IU]/mL (ref 0.450–4.500)

## 2022-03-27 ENCOUNTER — Other Ambulatory Visit: Payer: Self-pay | Admitting: Nurse Practitioner

## 2022-03-27 MED ORDER — EZETIMIBE 10 MG PO TABS: 10.0000 mg | ORAL_TABLET | Freq: Every day | ORAL | 3 refills | Status: DC

## 2022-03-27 NOTE — Progress Notes (Signed)
Contacted via [place] morning Gerald Stabs, your labs have returned: - CMP = Kidney function, creatinine and eGFR, remains normal, but liver function, AST and ALT remain mildly elevated -- if ongoing elevations on next visit then I recommend we obtain an ultrasound of your liver to check on this.  Glucose (sugar) is mildly elevated which goes along with your prediabetes and A1c 6.1%.  Continue focus on healthy diet changes and regular activity. - Cholesterol levels have trended up, if you can try taking Rosuvastatin 3 days a week and I am going to send in some Zetia (not a statin) to add to regimen with Rosuvastatin to help lower levels further.  Goal is LDL <70 for stroke prevention. - Remainder of labs are all stable.  Any questions? Keep being amazing!!  Thank you for allowing me to participate in your care.  I appreciate you. Kindest regards, Gatsby Chismar

## 2022-05-02 ENCOUNTER — Other Ambulatory Visit: Payer: Self-pay | Admitting: Nurse Practitioner

## 2022-05-02 ENCOUNTER — Telehealth: Payer: Self-pay | Admitting: Nurse Practitioner

## 2022-05-02 DIAGNOSIS — L989 Disorder of the skin and subcutaneous tissue, unspecified: Secondary | ICD-10-CM

## 2022-05-02 NOTE — Telephone Encounter (Signed)
Patient wants orders for new supplies sent to a different place, tired of dealing with the supplier he has now across from the hospital

## 2022-05-02 NOTE — Telephone Encounter (Signed)
Pt is calling to ask if referral can be placed to dermatology as discussed 03/25/22. Pt is also wanting to change his DME for his CPAP. Please advise CB- [phone]

## 2022-05-02 NOTE — Telephone Encounter (Addendum)
Patient states that he will look for his sleep study, really does not want to do this again . Is think that he wants to have supply orders sent to [facility].

## 2022-05-03 NOTE — Telephone Encounter (Signed)
Requested Prescriptions  Pending Prescriptions Disp Refills   cyclobenzaprine (FLEXERIL) 10 MG tablet [Pharmacy Med Name: CYCLOBENZAPRINE 10 MG TABLET] 30 tablet 1    Sig: TAKE 1 TABLET BY MOUTH EVERY DAY AT BEDTIME AS NEEDED     Not Delegated - Analgesics:  Muscle Relaxants Failed - 05/02/2022  4:09 PM      Failed - This refill cannot be delegated      Passed - Valid encounter within last 6 months    Recent Outpatient Visits           1 month ago Morbid obesity ([place])   [place], Henrine Screws T, NP   7 months ago Aortic atherosclerosis ([place])   [place], Henrine Screws T, NP   1 year ago Intraparenchymal hemorrhage of brain ([place])   [place], Barbaraann Faster, NP   1 year ago Intraparenchymal hemorrhage of brain ([place])   [place], Barbaraann Faster, NP   1 year ago Cough   [place], [place] T, NP       Future Appointments             In 4 months Cannady, [place] T, NP [facility]             gabapentin (NEURONTIN) 300 MG capsule [Pharmacy Med Name: GABAPENTIN 300 MG CAPSULE] 90 capsule 3    Sig: TAKE [place]     Neurology: Anticonvulsants - gabapentin Passed - 05/02/2022  4:09 PM      Passed - Cr in normal range and within 360 days    Creatinine, Ser  Date Value Ref Range Status  03/25/2022 1.15 0.76 - 1.27 mg/dL Final         Passed - Completed PHQ-2 or PHQ-9 in the last 360 days      Passed - Valid encounter within last 12 months    Recent Outpatient Visits           1 month ago Morbid obesity ([place])   [place], Jolene T, NP   7 months ago Aortic atherosclerosis ([place])   [place], Henrine Screws T, NP   1 year ago Intraparenchymal hemorrhage of brain ([place])   [place], Jolene T, NP   1 year ago Intraparenchymal hemorrhage of brain ([place])   [place],  Barbaraann Faster, NP   1 year ago Cough   [place], Barbaraann Faster, NP       Future Appointments             In 4 months Cannady, Barbaraann Faster, NP [facility]

## 2022-05-03 NOTE — Telephone Encounter (Signed)
Requested medications are due for refill today.  unsure  Requested medications are on the active medications list.  yes  Last refill. 09/23/2021 #30 1 rf  Future visit scheduled.   yes  Notes to clinic.  Refill not delegated.    Requested Prescriptions  Pending Prescriptions Disp Refills   cyclobenzaprine (FLEXERIL) 10 MG tablet [Pharmacy Med Name: CYCLOBENZAPRINE 10 MG TABLET] 30 tablet 1    Sig: TAKE 1 TABLET BY MOUTH EVERY DAY AT BEDTIME AS NEEDED     Not Delegated - Analgesics:  Muscle Relaxants Failed - 05/02/2022  4:09 PM      Failed - This refill cannot be delegated      Passed - Valid encounter within last 6 months    Recent Outpatient Visits           1 month ago Morbid obesity ([place])   [place], Henrine Screws T, NP   7 months ago Aortic atherosclerosis ([place])   [place], Henrine Screws T, NP   1 year ago Intraparenchymal hemorrhage of brain ([place])   [place], Barbaraann Faster, NP   1 year ago Intraparenchymal hemorrhage of brain ([place])   [place], Barbaraann Faster, NP   1 year ago Cough   [place], [place] T, NP       Future Appointments             In 4 months Cannady, [place] T, NP [facility]            Signed Prescriptions Disp Refills   gabapentin (NEURONTIN) 300 MG capsule 90 capsule 3    Sig: TAKE [place]     Neurology: Anticonvulsants - gabapentin Passed - 05/02/2022  4:09 PM      Passed - Cr in normal range and within 360 days    Creatinine, Ser  Date Value Ref Range Status  03/25/2022 1.15 0.76 - 1.27 mg/dL Final         Passed - Completed PHQ-2 or PHQ-9 in the last 360 days      Passed - Valid encounter within last 12 months    Recent Outpatient Visits           1 month ago Morbid obesity ([place])   [place], Jolene T, NP   7 months ago Aortic atherosclerosis ([place])   [place], Henrine Screws T, NP   1 year ago Intraparenchymal hemorrhage of brain ([place])   [place], Jolene T, NP   1 year ago Intraparenchymal hemorrhage of brain ([place])   [place], Barbaraann Faster, NP   1 year ago Cough   [place], Barbaraann Faster, NP       Future Appointments             In 4 months Cannady, Barbaraann Faster, NP [facility]

## 2022-06-11 ENCOUNTER — Other Ambulatory Visit: Payer: Self-pay | Admitting: Nurse Practitioner

## 2022-06-11 DIAGNOSIS — I10 Essential (primary) hypertension: Secondary | ICD-10-CM

## 2022-06-13 NOTE — Telephone Encounter (Signed)
Refilled 03/25/2022 #90 4 rf - confirmed by same pharmacy. Requested Prescriptions  Pending Prescriptions Disp Refills   telmisartan (MICARDIS) 80 MG tablet [Pharmacy Med Name: TELMISARTAN 80 MG TABLET] 90 tablet 4    Sig: TAKE 1 TABLET BY MOUTH EVERY DAY     Cardiovascular:  Angiotensin Receptor Blockers Passed - 06/11/2022  8:21 AM      Passed - Cr in normal range and within 180 days    Creatinine, Ser  Date Value Ref Range Status  03/25/2022 1.15 0.76 - 1.27 mg/dL Final         Passed - K in normal range and within 180 days    Potassium  Date Value Ref Range Status  03/25/2022 3.8 3.5 - 5.2 mmol/L Final         Passed - Patient is not pregnant      Passed - Last BP in normal range    BP Readings from Last 1 Encounters:  03/25/22 128/72         Passed - Valid encounter within last 6 months    Recent Outpatient Visits           2 months ago Morbid obesity ([place])   [place], Jolene T, NP   8 months ago Aortic atherosclerosis ([place])   [place], Henrine Screws T, NP   1 year ago Intraparenchymal hemorrhage of brain ([place])   [place], Jolene T, NP   1 year ago Intraparenchymal hemorrhage of brain ([place])   [place], Barbaraann Faster, NP   1 year ago Cough   [place], Barbaraann Faster, NP       Future Appointments             In 3 months Cannady, Barbaraann Faster, NP [facility]

## 2022-06-30 ENCOUNTER — Other Ambulatory Visit: Payer: Self-pay | Admitting: Nurse Practitioner

## 2022-07-01 NOTE — Telephone Encounter (Signed)
Requested medication (s) are due for refill today: yes  Requested medication (s) are on the active medication list: yes  Last refill:  05/03/22 #90 1 RF  Future visit scheduled: yes  Notes to clinic:  med not delegated to NT to RF   Requested Prescriptions  Pending Prescriptions Disp Refills   cyclobenzaprine (FLEXERIL) 10 MG tablet [Pharmacy Med Name: CYCLOBENZAPRINE 10 MG TABLET] 30 tablet 1    Sig: TAKE 1 TABLET BY MOUTH EVERY DAY AT BEDTIME AS NEEDED     Not Delegated - Analgesics:  Muscle Relaxants Failed - 06/30/2022  2:39 PM      Failed - This refill cannot be delegated      Passed - Valid encounter within last 6 months    Recent Outpatient Visits           3 months ago Morbid obesity ([place])   [place], Henrine Screws T, NP   9 months ago Aortic atherosclerosis ([place])   [place], Henrine Screws T, NP   1 year ago Intraparenchymal hemorrhage of brain ([place])   [place], Henrine Screws T, NP   1 year ago Intraparenchymal hemorrhage of brain [facility])   [place], Henrine Screws T, NP   1 year ago Cough   [place], Barbaraann Faster, NP       Future Appointments             In 2 months Cannady, Barbaraann Faster, NP [facility]

## 2022-09-24 NOTE — Patient Instructions (Signed)

## 2022-09-26 ENCOUNTER — Ambulatory Visit (INDEPENDENT_AMBULATORY_CARE_PROVIDER_SITE_OTHER): Payer: Commercial Managed Care - PPO | Admitting: Nurse Practitioner

## 2022-09-26 ENCOUNTER — Encounter: Payer: Self-pay | Admitting: Nurse Practitioner

## 2022-09-26 DIAGNOSIS — N529 Male erectile dysfunction, unspecified: Secondary | ICD-10-CM | POA: Insufficient documentation

## 2022-09-26 DIAGNOSIS — N522 Drug-induced erectile dysfunction: Secondary | ICD-10-CM

## 2022-09-26 DIAGNOSIS — I700 Atherosclerosis of aorta: Secondary | ICD-10-CM

## 2022-09-26 DIAGNOSIS — M25562 Pain in left knee: Secondary | ICD-10-CM

## 2022-09-26 DIAGNOSIS — G4733 Obstructive sleep apnea (adult) (pediatric): Secondary | ICD-10-CM

## 2022-09-26 DIAGNOSIS — Z8673 Personal history of transient ischemic attack (TIA), and cerebral infarction without residual deficits: Secondary | ICD-10-CM

## 2022-09-26 DIAGNOSIS — M25561 Pain in right knee: Secondary | ICD-10-CM | POA: Insufficient documentation

## 2022-09-26 DIAGNOSIS — E783 Hyperchylomicronemia: Secondary | ICD-10-CM | POA: Diagnosis not present

## 2022-09-26 DIAGNOSIS — I10 Essential (primary) hypertension: Secondary | ICD-10-CM

## 2022-09-26 DIAGNOSIS — R7989 Other specified abnormal findings of blood chemistry: Secondary | ICD-10-CM

## 2022-09-26 DIAGNOSIS — R7301 Impaired fasting glucose: Secondary | ICD-10-CM

## 2022-09-26 DIAGNOSIS — G8929 Other chronic pain: Secondary | ICD-10-CM

## 2022-09-26 MED ORDER — MELOXICAM 7.5 MG PO TABS
7.5000 mg | ORAL_TABLET | Freq: Every day | ORAL | 4 refills | Status: DC
Start: 2022-09-26 — End: 2023-02-14

## 2022-09-26 MED ORDER — SILDENAFIL CITRATE 100 MG PO TABS: 50.0000 mg | ORAL_TABLET | Freq: Every day | ORAL | 11 refills | Status: DC | PRN

## 2022-09-26 NOTE — Assessment & Plan Note (Signed)
Chronic, stable . BP at goal on recheck today and at goal at home. Continue current medication regimen and recommend DASH diet at home, lowering sodium in diet to avoid fluid retention. Adjust regimen as needed.  Continue to collaborate with cardiology as needed.  Labs: CMP.  Will further consider discontinuation of Chlorthalidone if ongoing gout flares and possibly need to start Hydralazine or Clonidine -- avoid BB due to low resting HR.  Return to office in 6 months.

## 2022-09-26 NOTE — Assessment & Plan Note (Signed)
Chronic, stable -- continue 100% use of CPAP at home.   

## 2022-09-26 NOTE — Assessment & Plan Note (Signed)
Chronic, ongoing for years.  Will send in Meloxicam to take sparsely as needed.  Educated him on this and discussed side effects with this.  To use very rarely.

## 2022-09-26 NOTE — Assessment & Plan Note (Signed)
BMI 45.96 with HTN/HLD, IFG. Recommended eating smaller high protein, low fat meals more frequently and exercising 30 mins a day 5 times a week with a goal of 10-15lb weight loss in the next 3 months. Patient voiced their understanding and motivation to adhere to these recommendations.

## 2022-09-26 NOTE — Assessment & Plan Note (Signed)
Recheck CMP today.  °

## 2022-09-26 NOTE — Assessment & Plan Note (Signed)
Ongoing and monitored -- diet focus, A1c last visit 6.1%.

## 2022-09-26 NOTE — Assessment & Plan Note (Signed)
Chronic.  Noted on CXR 12/25/20.  Recommend continuation of statin daily, avoid ASA at this time due to recent bleed. 

## 2022-09-26 NOTE — Assessment & Plan Note (Signed)
Chronic, ongoing.  Continue current medication regimen and adjust as needed -- is on three days weekly Crestor due to myalgia with increased doses + continue Zetia daily  Will plan on repeat CMP and lipids. Educated him on stroke prevention goals: BP <130/90 LDL <70 A1C <6.5

## 2022-09-26 NOTE — Assessment & Plan Note (Addendum)
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 Continue statin daily and avoid ASA due to past bleed.  BP at goal today.  Check A1c, CMP, CBC. Educated him on stroke prevention goals: BP <130/90 LDL <70 A1C <6.5

## 2022-09-26 NOTE — Assessment & Plan Note (Signed)
Over past 6 months with BP medications on board.  Will trial low dose Viagra.  Instructed on this and side effects to monitor for + when to stop taking medication.

## 2022-09-26 NOTE — Progress Notes (Signed)
BP 128/68 (BP Location: Left Arm, Patient Position: Sitting, Cuff Size: Large)   Pulse (!) 58   Temp 97.7 F (36.5 C) (Oral)   Ht 5' 10.98" (1.803 m)   Wt (!) 329 lb 6.4 oz (149.4 kg)   SpO2 98%   BMI 45.96 kg/m    Subjective:    Patient ID: Paul Cannon, male    DOB: 1960-10-07, 62 y.o.   MRN: [id number]  HPI: Paul Cannon is a 62 y.o. male  Chief Complaint  Patient presents with   Hypertension   Hyperlipidemia   Prediabetes   Occasional knee pain to both knees -- has Flexeril at home he takes on occasion.  His wife would like him to get some Meloxicam.    HYPERTENSION / HYPERLIPIDEMIA Continues on Chlorthalidone, Micardis, Amlodipine, Zetia, and Crestor taking 3 times a week. Intraparenchymal hemorrhage diagnosed 11/03/20.  Repeat MRI on 03/05/21 -- noted continued evolution and regression of right frontal parenchymal hemorrhage.  Last saw neuro 11/28/20 and oncology 03/12/21 -- he was cleared from oncology perspective. Last saw cardiology 10/17/2019 to return as needed only.  History of CVA, lacunar infarcts.  Had past MYO screen, normal with EF 55-65%.   A1c last check was 6.1% in October, has been sitting in 6's for over one year. Does endorses difficulty achieving erection over past 6 months, would like to trial something for ED.   Has underlying sleep apnea, he continues to use CPAP nightly. Recently saw pulmonary on 05/03/21 -- for CPAP.  Satisfied with current treatment? yes Duration of hypertension: chronic BP monitoring frequency: weekly BP range: 130/70-80 BP medication side effects: no Duration of hyperlipidemia: chronic Cholesterol medication side effects: no Cholesterol supplements: none Medication compliance: good compliance Aspirin: yes Recent stressors: no Recurrent headaches: no Visual changes: no Palpitations: no Dyspnea: no Chest pain: no Lower extremity edema: no Dizzy/lightheaded: no  The ASCVD Risk score (Arnett DK, et al., 2019)  failed to calculate for the following reasons:   The patient has a prior MI or stroke diagnosis  Relevant past medical, surgical, family and social history reviewed and updated as indicated. Interim medical history since our last visit reviewed. Allergies and medications reviewed and updated.  Review of Systems  Constitutional:  Negative for activity change, diaphoresis, fatigue and fever.  Respiratory:  Negative for cough, chest tightness, shortness of breath and wheezing.   Cardiovascular:  Negative for chest pain, palpitations and leg swelling.  Gastrointestinal: Negative.   Neurological: Negative.   Psychiatric/Behavioral: Negative.      Per HPI unless specifically indicated above     Objective:    BP 128/68 (BP Location: Left Arm, Patient Position: Sitting, Cuff Size: Large)   Pulse (!) 58   Temp 97.7 F (36.5 C) (Oral)   Ht 5' 10.98" (1.803 m)   Wt (!) 329 lb 6.4 oz (149.4 kg)   SpO2 98%   BMI 45.96 kg/m   Wt Readings from Last 3 Encounters:  09/26/22 (!) 329 lb 6.4 oz (149.4 kg)  03/25/22 (!) 323 lb 11.2 oz (146.8 kg)  09/23/21 (!) 300 lb 9.6 oz (136.4 kg)    Physical Exam Vitals and nursing note reviewed.  Constitutional:      General: He is awake. He is not in acute distress.    Appearance: He is well-developed and well-groomed. He is obese. He is not ill-appearing or toxic-appearing.  HENT:     Head: Normocephalic and atraumatic.     Right Ear: Hearing normal. No drainage.  Left Ear: Hearing normal. No drainage.  Eyes:     General: Lids are normal.        Right eye: No discharge.        Left eye: No discharge.     Conjunctiva/sclera: Conjunctivae normal.     Pupils: Pupils are equal, round, and reactive to light.  Neck:     Thyroid: No thyromegaly.     Vascular: No carotid bruit.  Cardiovascular:     Rate and Rhythm: Regular rhythm. Bradycardia present.     Heart sounds: Normal heart sounds, S1 normal and S2 normal. No murmur heard.    No gallop.   Pulmonary:     Effort: Pulmonary effort is normal. No accessory muscle usage or respiratory distress.     Breath sounds: Normal breath sounds.  Abdominal:     General: Bowel sounds are normal.     Palpations: Abdomen is soft. There is no hepatomegaly or splenomegaly.  Musculoskeletal:        General: Normal range of motion.     Cervical back: Normal range of motion and neck supple.     Right lower leg: No edema.     Left lower leg: No edema.  Skin:    General: Skin is warm and dry.     Capillary Refill: Capillary refill takes less than 2 seconds.     Findings: No rash.  Neurological:     Mental Status: He is alert and oriented to person, place, and time.     Gait: Gait is intact.     Deep Tendon Reflexes: Reflexes are normal and symmetric.     Reflex Scores:      Brachioradialis reflexes are 2+ on the right side and 2+ on the left side.      Patellar reflexes are 2+ on the right side and 2+ on the left side. Psychiatric:        Attention and Perception: Attention normal.        Mood and Affect: Mood normal.        Speech: Speech normal.        Behavior: Behavior normal. Behavior is cooperative.        Thought Content: Thought content normal.    Results for orders placed or performed in visit on 03/25/22  Bayer DCA Hb A1c Waived  Result Value Ref Range   HB A1C (BAYER DCA - WAIVED) 6.1 (H) 4.8 - 5.6 %  CBC with Differential/Platelet  Result Value Ref Range   WBC 5.2 3.4 - 10.8 x10E3/uL   RBC 5.12 4.14 - 5.80 x10E6/uL   Hemoglobin 14.7 13.0 - 17.7 g/dL   Hematocrit 32.9 92.4 - 51.0 %   MCV 86 79 - 97 fL   MCH 28.7 26.6 - 33.0 pg   MCHC 33.5 31.5 - 35.7 g/dL   RDW 26.8 34.1 - 96.2 %   Platelets 199 150 - 450 x10E3/uL   Neutrophils 62 Not Estab. %   Lymphs 26 Not Estab. %   Monocytes 9 Not Estab. %   Eos 3 Not Estab. %   Basos 0 Not Estab. %   Neutrophils Absolute 3.2 1.4 - 7.0 x10E3/uL   Lymphocytes Absolute 1.4 0.7 - 3.1 x10E3/uL   Monocytes Absolute 0.5 0.1 - 0.9  x10E3/uL   EOS (ABSOLUTE) 0.2 0.0 - 0.4 x10E3/uL   Basophils Absolute 0.0 0.0 - 0.2 x10E3/uL   Immature Granulocytes 0 Not Estab. %   Immature Grans (Abs) 0.0 0.0 - 0.1 x10E3/uL  Comprehensive metabolic panel  Result Value Ref Range   Glucose 123 (H) 70 - 99 mg/dL   BUN 17 8 - 27 mg/dL   Creatinine, Ser 1.61 0.76 - 1.27 mg/dL   eGFR 72 >09 UE/AVW/0.98   BUN/Creatinine Ratio 15 10 - 24   Sodium 143 134 - 144 mmol/L   Potassium 3.8 3.5 - 5.2 mmol/L   Chloride 102 96 - 106 mmol/L   CO2 24 20 - 29 mmol/L   Calcium 9.5 8.6 - 10.2 mg/dL   Total Protein 6.7 6.0 - 8.5 g/dL   Albumin 4.2 3.9 - 4.9 g/dL   Globulin, Total 2.5 1.5 - 4.5 g/dL   Albumin/Globulin Ratio 1.7 1.2 - 2.2   Bilirubin Total 0.4 0.0 - 1.2 mg/dL   Alkaline Phosphatase 73 44 - 121 IU/L   AST 57 (H) 0 - 40 IU/L   ALT 76 (H) 0 - 44 IU/L  Lipid Panel w/o Chol/HDL Ratio  Result Value Ref Range   Cholesterol, Total 189 100 - 199 mg/dL   Triglycerides 119 (H) 0 - 149 mg/dL   HDL 38 (L) >14 mg/dL   VLDL Cholesterol Cal 38 5 - 40 mg/dL   LDL Chol Calc (NIH) 782 (H) 0 - 99 mg/dL  TSH  Result Value Ref Range   TSH 3.520 0.450 - 4.500 uIU/mL  PSA  Result Value Ref Range   Prostate Specific Ag, Serum 1.3 0.0 - 4.0 ng/mL  Uric acid  Result Value Ref Range   Uric Acid 7.0 3.8 - 8.4 mg/dL      Assessment & Plan:   Problem List Items Addressed This Visit       Cardiovascular and Mediastinum   Aortic atherosclerosis    Chronic.  Noted on CXR 12/25/20.  Recommend continuation of statin daily, avoid ASA at this time due to recent bleed.      Relevant Medications   sildenafil (VIAGRA) 100 MG tablet   Other Relevant Orders   Comprehensive metabolic panel   Lipid Panel w/o Chol/HDL Ratio   Hypertension    Chronic, stable . BP at goal on recheck today and at goal at home. Continue current medication regimen and recommend DASH diet at home, lowering sodium in diet to avoid fluid retention. Adjust regimen as needed.   Continue to collaborate with cardiology as needed.  Labs: CMP.  Will further consider discontinuation of Chlorthalidone if ongoing gout flares and possibly need to start Hydralazine or Clonidine -- avoid BB due to low resting HR.  Return to office in 6 months.      Relevant Medications   sildenafil (VIAGRA) 100 MG tablet   Other Relevant Orders   Comprehensive metabolic panel     Respiratory   OSA on CPAP    Chronic, stable -- continue 100% use of CPAP at home.          Endocrine   IFG (impaired fasting glucose)    Ongoing and monitored -- diet focus, A1c last visit 6.1%.      Relevant Orders   HgB A1c     Other   Bilateral knee pain    Chronic, ongoing for years.  Will send in Meloxicam to take sparsely as needed.  Educated him on this and discussed side effects with this.  To use very rarely.      Elevated LFTs    Recheck CMP today.      Erectile dysfunction    Over past 6 months with BP medications on board.  Will trial low dose Viagra.  Instructed on this and side effects to monitor for + when to stop taking medication.      History of stroke    In August 2020 and intraparenchymal bleed June 2022. Continue statin daily and avoid ASA due to past bleed.  BP at goal today.  Check A1c, CMP, CBC. Educated him on stroke prevention goals: BP <130/90 LDL <70 A1C <6.5       Hyperlipidemia    Chronic, ongoing.  Continue current medication regimen and adjust as needed -- is on three days weekly Crestor due to myalgia with increased doses + continue Zetia daily  Will plan on repeat CMP and lipids. Educated him on stroke prevention goals: BP <130/90 LDL <70 A1C <6.5      Relevant Medications   sildenafil (VIAGRA) 100 MG tablet   Other Relevant Orders   Comprehensive metabolic panel   Lipid Panel w/o Chol/HDL Ratio   Morbid obesity (HCC) - Primary    BMI 45.96 with HTN/HLD, IFG. Recommended eating smaller high protein, low fat meals more frequently and exercising 30 mins  a day 5 times a week with a goal of 10-15lb weight loss in the next 3 months. Patient voiced their understanding and motivation to adhere to these recommendations.         Follow up plan: Return in about 6 months (around 03/28/2023) for Annual physical after 03/26/23.

## 2022-09-27 LAB — COMPREHENSIVE METABOLIC PANEL
ALT: 62 IU/L — ABNORMAL HIGH (ref 0–44)
AST: 49 IU/L — ABNORMAL HIGH (ref 0–40)
Albumin/Globulin Ratio: 1.7 (ref 1.2–2.2)
Albumin: 4.2 g/dL (ref 3.9–4.9)
Alkaline Phosphatase: 79 IU/L (ref 44–121)
BUN/Creatinine Ratio: 12 (ref 10–24)
BUN: 12 mg/dL (ref 8–27)
Bilirubin Total: 0.4 mg/dL (ref 0.0–1.2)
CO2: 21 mmol/L (ref 20–29)
Calcium: 9.2 mg/dL (ref 8.6–10.2)
Chloride: 101 mmol/L (ref 96–106)
Creatinine, Ser: 1.04 mg/dL (ref 0.76–1.27)
Globulin, Total: 2.5 g/dL (ref 1.5–4.5)
Glucose: 153 mg/dL — ABNORMAL HIGH (ref 70–99)
Potassium: 3.9 mmol/L (ref 3.5–5.2)
Sodium: 141 mmol/L (ref 134–144)
Total Protein: 6.7 g/dL (ref 6.0–8.5)
eGFR: 81 mL/min/{1.73_m2} (ref >59)

## 2022-09-27 LAB — LIPID PANEL W/O CHOL/HDL RATIO
Cholesterol, Total: 172 mg/dL (ref 100–199)
HDL: 37 mg/dL — ABNORMAL LOW (ref >39)
LDL Chol Calc (NIH): 93 mg/dL (ref 0–99)
Triglycerides: 249 mg/dL — ABNORMAL HIGH (ref 0–149)
VLDL Cholesterol Cal: 42 mg/dL — ABNORMAL HIGH (ref 5–40)

## 2022-09-27 LAB — HEMOGLOBIN A1C
Est. average glucose Bld gHb Est-mCnc: 151 mg/dL
Hgb A1c MFr Bld: 6.9 % — ABNORMAL HIGH (ref 4.8–5.6)

## 2022-09-27 NOTE — Progress Notes (Signed)
Contacted via MyChart -- however please call to ensure he received message and see which A1c option he would like to take -- he will need a 3 month follow-up with me in office, currently not scheduled until October but A1c has trended up to diabetes level.  My message:  Good evening Paul Cannon, your labs have returned: - Kidney function, creatinine and eGFR, remains normal.  Liver function continues to show mild elevations (AST and ALT), although these continue to trend down. Glucose level has trended up, which we will go over with A1c chat. - Cholesterol levels show stable LDL, although I would like to see lower and we can discuss more next visit.  Triglycerides remain elevated.  Focus heavy on diet at home. - A1c, the diabetes testing, is my biggest concern this lab check.  It has now trended up to diabetes range at 6.9%.  Remember me saying any A1c 6.5% or greater is considered diabetic range.  At this point since it is below 7% we have a couple options: #1 You could focus very heavily on diet and exercise over the next 3 months and we can then recheck in office, if remains elevated we start medication at that time.  If you choose this option I highly recommend the American Diabetes Association website for a ton of information on diet and changes to make + recipes. #2 We can go ahead and start a medication called Metformin at 500 MG BID, this medication helps encourage your body to use the insulin it is making.  Side effects can be diarrhea, constipation, or nausea at times.  We would then recheck A1c in 3 months.   Please let me know which option you would like to take.  I will then send in a glucometer for you to start checking blood sugars daily with morning fasting goal <130 and two hours after meals goal will be <180.  I will also have staff call and schedule a 3 month follow-up in office.  Any questions? Keep being amazing!!  Thank you for allowing me to participate in your care.  I appreciate  you. Kindest regards, Vy Badley

## 2022-09-28 ENCOUNTER — Encounter: Payer: Self-pay | Admitting: Nurse Practitioner

## 2022-10-01 ENCOUNTER — Other Ambulatory Visit: Payer: Self-pay | Admitting: Nurse Practitioner

## 2022-10-03 NOTE — Telephone Encounter (Signed)
Requested medication (s) are due for refill today: yes  Requested medication (s) are on the active medication list: yes  Last refill:  07/01/22 #30/1  Future visit scheduled: yes  Notes to clinic:  Unable to refill per protocol, cannot delegate.      Requested Prescriptions  Pending Prescriptions Disp Refills   cyclobenzaprine (FLEXERIL) 10 MG tablet [Pharmacy Med Name: CYCLOBENZAPRINE 10 MG TABLET] 30 tablet 1    Sig: TAKE 1 TABLET BY MOUTH EVERY DAY AT BEDTIME AS NEEDED     Not Delegated - Analgesics:  Muscle Relaxants Failed - 10/01/2022 11:20 AM      Failed - This refill cannot be delegated      Passed - Valid encounter within last 6 months    Recent Outpatient Visits           1 week ago Morbid obesity (HCC)   [place] [facility] [place], [place] T, NP   6 months ago Morbid obesity (HCC)   [place] [facility] [place], Corrie Dandy T, NP   1 year ago Aortic atherosclerosis (HCC)   [place] [facility] [place], Corrie Dandy T, NP   1 year ago Intraparenchymal hemorrhage of brain (HCC)   [place] [facility] [place], Corrie Dandy T, NP   1 year ago Intraparenchymal hemorrhage of brain [facility])   [place] [facility] [place], Dorie Rank, NP       Future Appointments             In 2 months [place], Dorie Rank, NP [place] [facility], [facility]   In 5 months [place], Dorie Rank, NP  [facility], [facility]

## 2022-11-23 ENCOUNTER — Other Ambulatory Visit: Payer: Self-pay | Admitting: Nurse Practitioner

## 2022-11-23 NOTE — Telephone Encounter (Signed)
Requested medication (s) are due for refill today: yes  Requested medication (s) are on the active medication list: yes  Last refill:  10/03/22  Future visit scheduled: yes  Notes to clinic:  Unable to refill per protocol, cannot delegate.      Requested Prescriptions  Pending Prescriptions Disp Refills   cyclobenzaprine (FLEXERIL) 10 MG tablet [Pharmacy Med Name: CYCLOBENZAPRINE 10 MG TABLET] 30 tablet 1    Sig: TAKE 1 TABLET BY MOUTH EVERY DAY AT BEDTIME AS NEEDED     Not Delegated - Analgesics:  Muscle Relaxants Failed - 11/23/2022  2:11 AM      Failed - This refill cannot be delegated      Passed - Valid encounter within last 6 months    Recent Outpatient Visits           1 month ago Morbid obesity (HCC)   [place] [facility] [place], [place] T, NP   8 months ago Morbid obesity (HCC)   [place] [facility] [place], Corrie Dandy T, NP   1 year ago Aortic atherosclerosis (HCC)   [place] [facility] [place], Corrie Dandy T, NP   1 year ago Intraparenchymal hemorrhage of brain (HCC)   [place] [facility] [place], Corrie Dandy T, NP   1 year ago Intraparenchymal hemorrhage of brain (HCC)   [place] [facility] [place], Dorie Rank, NP       Future Appointments             In 1 month [place], Dorie Rank, NP [facility] [facility], [facility]   In 4 months [place], Dorie Rank, NP [place] [facility], [facility]

## 2022-12-24 NOTE — Patient Instructions (Signed)
Be Involved in Caring For Your Health:  Taking Medications When medications are taken as directed, they can greatly improve your health. But if they are not taken as prescribed, they may not work. In some cases, not taking them correctly can be harmful. To help ensure your treatment remains effective and safe, understand your medications and how to take them. Bring your medications to each visit for review by your provider.  Your lab results, notes, and after visit summary will be available on My Chart. We strongly encourage you to use this feature. If lab results are abnormal the clinic will contact you with the appropriate steps. If the clinic does not contact you assume the results are satisfactory. You can always view your results on My Chart. If you have questions regarding your health or results, please contact the clinic during office hours. You can also ask questions on My Chart.  We at [facility] are grateful that you chose us to provide your care. We strive to provide evidence-based and compassionate care and are always looking for feedback. If you get a survey from the clinic please complete this so we can hear your opinions.  Diabetes Mellitus Basics  Diabetes mellitus, or diabetes, is a long-term (chronic) disease. It occurs when the body does not properly use sugar (glucose) that is released from food after you eat. Diabetes mellitus may be caused by one or both of these problems: Your pancreas does not make enough of a hormone called insulin. Your body does not react in a normal way to the insulin that it makes. Insulin lets glucose enter cells in your body. This gives you energy. If you have diabetes, glucose cannot get into cells. This causes high blood glucose (hyperglycemia). How to treat and manage diabetes You may need to take insulin or other diabetes medicines daily to keep your glucose in balance. If you are prescribed insulin, you will learn how to give  yourself insulin by injection. You may need to adjust the amount of insulin you take based on the foods that you eat. You will need to check your blood glucose levels using a glucose monitor as told by your health care provider. The readings can help determine if you have low or high blood glucose. Generally, you should have these blood glucose levels: Before meals (preprandial): 80-130 mg/dL (4.4-7.2 mmol/L). After meals (postprandial): below 180 mg/dL (10 mmol/L). Hemoglobin A1c (HbA1c) level: less than 7%. Your health care provider will set treatment goals for you. Keep all follow-up visits. This is important. Follow these instructions at home: Diabetes medicines Take your diabetes medicines every day as told by your health care provider. List your diabetes medicines here: Name of medicine: ______________________________ Amount (dose): _______________ Time (a.m./p.m.): _______________ Notes: ___________________________________ Name of medicine: ______________________________ Amount (dose): _______________ Time (a.m./p.m.): _______________ Notes: ___________________________________ Name of medicine: ______________________________ Amount (dose): _______________ Time (a.m./p.m.): _______________ Notes: ___________________________________ Insulin If you use insulin, list the types of insulin you use here: Insulin type: ______________________________ Amount (dose): _______________ Time (a.m./p.m.): _______________Notes: ___________________________________ Insulin type: ______________________________ Amount (dose): _______________ Time (a.m./p.m.): _______________ Notes: ___________________________________ Insulin type: ______________________________ Amount (dose): _______________ Time (a.m./p.m.): _______________ Notes: ___________________________________ Insulin type: ______________________________ Amount (dose): _______________ Time (a.m./p.m.): _______________ Notes:  ___________________________________ Insulin type: ______________________________ Amount (dose): _______________ Time (a.m./p.m.): _______________ Notes: ___________________________________ Managing blood glucose  Check your blood glucose levels using a glucose monitor as told by your health care provider. Write down the times that you check your glucose levels here: Time: _______________ Notes: ___________________________________   Time: _______________ Notes: ___________________________________ Time: _______________ Notes: ___________________________________ Time: _______________ Notes: ___________________________________ Time: _______________ Notes: ___________________________________ Time: _______________ Notes: ___________________________________  Low blood glucose Low blood glucose (hypoglycemia) is when glucose is at or below 70 mg/dL (3.9 mmol/L). Symptoms may include: Feeling: Hungry. Sweaty and clammy. Irritable or easily upset. Dizzy. Sleepy. Having: A fast heartbeat. A headache. A change in your vision. Numbness around the mouth, lips, or tongue. Having trouble with: Moving (coordination). Sleeping. Treating low blood glucose To treat low blood glucose, eat or drink something containing sugar right away. If you can think clearly and swallow safely, follow the 15:15 rule: Take 15 grams of a fast-acting carb (carbohydrate), as told by your health care provider. Some fast-acting carbs are: Glucose tablets: take 3-4 tablets. Hard candy: eat 3-5 pieces. Fruit juice: drink 4 oz (120 mL). Regular (not diet) soda: drink 4-6 oz (120-180 mL). Honey or sugar: eat 1 Tbsp (15 mL). Check your blood glucose levels 15 minutes after you take the carb. If your glucose is still at or below 70 mg/dL (3.9 mmol/L), take 15 grams of a carb again. If your glucose does not go above 70 mg/dL (3.9 mmol/L) after 3 tries, get help right away. After your glucose goes back to normal, eat a meal  or a snack within 1 hour. Treating very low blood glucose If your glucose is at or below 54 mg/dL (3 mmol/L), you have very low blood glucose (severe hypoglycemia). This is an emergency. Do not wait to see if the symptoms will go away. Get medical help right away. Call your local emergency services (911 in the [place] Do not drive yourself to the hospital. Questions to ask your health care provider Should I talk with a diabetes educator? What equipment will I need to care for myself at home? What diabetes medicines do I need? When should I take them? How often do I need to check my blood glucose levels? What number can I call if I have questions? When is my follow-up visit? Where can I find a support group for people with diabetes? Where to find more information American Diabetes Association: www.diabetes.org Association of Diabetes Care and Education Specialists: www.diabeteseducator.org Contact a health care provider if: Your blood glucose is at or above 240 mg/dL (13.3 mmol/L) for 2 days in a row. You have been sick or have had a fever for 2 days or more, and you are not getting better. You have any of these problems for more than 6 hours: You cannot eat or drink. You feel nauseous. You vomit. You have diarrhea. Get help right away if: Your blood glucose is lower than 54 mg/dL (3 mmol/L). You get confused. You have trouble thinking clearly. You have trouble breathing. These symptoms may represent a serious problem that is an emergency. Do not wait to see if the symptoms will go away. Get medical help right away. Call your local emergency services (911 in the [place] Do not drive yourself to the hospital. Summary Diabetes mellitus is a chronic disease that occurs when the body does not properly use sugar (glucose) that is released from food after you eat. Take insulin and diabetes medicines as told. Check your blood glucose every day, as often as told. Keep all follow-up visits. This  is important. This information is not intended to replace advice given to you by your health care provider. Make sure you discuss any questions you have with your health care provider. Document Revised: 09/24/2019 Document Reviewed: 09/24/2019 Elsevier Patient Education    2024 Elsevier Inc.  

## 2022-12-26 ENCOUNTER — Encounter: Payer: Self-pay | Admitting: Nurse Practitioner

## 2022-12-26 ENCOUNTER — Ambulatory Visit (INDEPENDENT_AMBULATORY_CARE_PROVIDER_SITE_OTHER): Payer: Commercial Managed Care - PPO | Admitting: Nurse Practitioner

## 2022-12-26 VITALS — BP 136/82 | HR 76 | Temp 98.0°F | Resp 18 | Ht 70.98 in | Wt 320.8 lb

## 2022-12-26 DIAGNOSIS — E119 Type 2 diabetes mellitus without complications: Secondary | ICD-10-CM

## 2022-12-26 DIAGNOSIS — I700 Atherosclerosis of aorta: Secondary | ICD-10-CM

## 2022-12-26 DIAGNOSIS — E669 Obesity, unspecified: Secondary | ICD-10-CM

## 2022-12-26 DIAGNOSIS — E785 Hyperlipidemia, unspecified: Secondary | ICD-10-CM

## 2022-12-26 DIAGNOSIS — E1169 Type 2 diabetes mellitus with other specified complication: Secondary | ICD-10-CM

## 2022-12-26 DIAGNOSIS — E1159 Type 2 diabetes mellitus with other circulatory complications: Secondary | ICD-10-CM

## 2022-12-26 DIAGNOSIS — G4733 Obstructive sleep apnea (adult) (pediatric): Secondary | ICD-10-CM

## 2022-12-26 DIAGNOSIS — R7989 Other specified abnormal findings of blood chemistry: Secondary | ICD-10-CM

## 2022-12-26 DIAGNOSIS — I152 Hypertension secondary to endocrine disorders: Secondary | ICD-10-CM

## 2022-12-26 DIAGNOSIS — Z8673 Personal history of transient ischemic attack (TIA), and cerebral infarction without residual deficits: Secondary | ICD-10-CM

## 2022-12-26 LAB — MICROALBUMIN, URINE WAIVED
Creatinine, Urine Waived: 200 mg/dL (ref 10–300)
Microalb, Ur Waived: 30 mg/L — ABNORMAL HIGH (ref 0–19)
Microalb/Creat Ratio: <30 mg/g (ref <30)

## 2022-12-26 LAB — BAYER DCA HB A1C WAIVED: HB A1C (BAYER DCA - WAIVED): 7.6 % — ABNORMAL HIGH (ref 4.8–5.6)

## 2022-12-26 MED ORDER — METFORMIN HCL 500 MG PO TABS: ORAL_TABLET | ORAL | 4 refills | Status: DC

## 2022-12-26 MED ORDER — ONETOUCH ULTRASOFT LANCETS MISC: 12 refills | Status: DC

## 2022-12-26 MED ORDER — ONETOUCH ULTRA TEST VI STRP: ORAL_STRIP | 12 refills | Status: DC

## 2022-12-26 MED ORDER — ONETOUCH VERIO W/DEVICE KIT: PACK | 0 refills | Status: AC

## 2022-12-26 MED ORDER — SILDENAFIL CITRATE 100 MG PO TABS: 50.0000 mg | ORAL_TABLET | Freq: Every day | ORAL | 11 refills | Status: AC | PRN

## 2022-12-26 NOTE — Assessment & Plan Note (Signed)
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 Continue statin daily and avoid ASA due to past bleed.  BP at goal today.  Check A1c, CMP, CBC. Educated him on stroke prevention goals: BP <130/90 LDL <70 A1C <6.5

## 2022-12-26 NOTE — Assessment & Plan Note (Signed)
Chronic, ongoing.  Continue current medication regimen and adjust as needed -- is on three days weekly Crestor due to myalgia with increased doses + continue Zetia daily  Will plan on repeat CMP and lipids. Educated him on stroke prevention goals: BP <130/90 LDL <70 A1C <6.5

## 2022-12-26 NOTE — Assessment & Plan Note (Signed)
Diagnosed initially April 2024 and wanted to maintain diet focus.  A1c today trending up to 7.6%.  Urine ALB 03 January 2023.  Discussed at this point treatment would be beneficial.  He agrees with this plan.  Discussed options to include Metformin, GLP1, SGLT2, DPP4.  At this time he would like to start with oral medication and prefers to try Metformin.  Educated him on side effects and how medication works.  Offered referral to diabetes education, he declined at this time. - Start Metformin 500 MG BID for one week and then increase to 1000 MG BID.  Recent kidney function stable, eGFR 81. - Glucometer supplies sent in and BS log provided.  Recommend he check fasting sugar daily + occasional BS check 2 hours after a meal and document for provider.   - Continue ARB and statin therapy - Recommend he obtain eye exam.  Foot exam.

## 2022-12-26 NOTE — Assessment & Plan Note (Signed)
Chronic, stable -- continue 100% use of CPAP at home.

## 2022-12-26 NOTE — Progress Notes (Signed)
BP 136/82 (BP Location: Left Arm)   Pulse 76   Temp 98 F (36.7 C) (Oral)   Resp 18   Ht 5' 10.98" (1.803 m)   Wt (!) 320 lb 12.8 oz (145.5 kg)   SpO2 96%   BMI 44.76 kg/m    Subjective:    Patient ID: Paul Cannon, male    DOB: [birth year], 62 y.o.   MRN: [id number]  HPI: Paul Cannon is a 62 y.o. male  Chief Complaint  Patient presents with   Diabetes   DIABETES Last A1c 6.9% in April, he had wished to focus on diet at the time.  Has been working on diet and exercise with 9 pounds lost. Has been working on cutting out snacking.  Cut back on [place]bbie.   Hypoglycemic episodes:no Polydipsia/polyuria: no Visual disturbance: no Chest pain: no Paresthesias: no Glucose Monitoring: no  Accucheck frequency: Not Checking  Fasting glucose:  Post prandial:  Evening:  Before meals: Taking Insulin?: no  Long acting insulin:  Short acting insulin: Blood Pressure Monitoring: rarely Retinal Examination: Not up to Date -- Dr. Katherina Mires in [place] Foot Exam: Up to Date Diabetic Education: Not Completed Pneumovax: Not up to Date Influenza: Up to Date Aspirin: no   HYPERTENSION / HYPERLIPIDEMIA Taking Chlorthalidone, Micardis, Amlodipine, Zetia, and Crestor taking 3 times a week -- has been trying every other day.   Intraparenchymal hemorrhage diagnosed 11/03/20.  MRI on 03/05/21 -- noted continued evolution and regression of right frontal parenchymal hemorrhage. Neuro last seen 11/28/20 and oncology 03/12/21 -- he was cleared from oncology perspective. Cardiology last seen 10/17/2019 to return as needed only.  History of CVA, lacunar infarcts.  Had past MYO screen, normal with EF 55-65%.   Has underlying sleep apnea, he continues to use CPAP nightly. Saw pulmonary on 05/03/21 -- for CPAP.  Satisfied with current treatment? yes Duration of hypertension: chronic BP monitoring frequency: rarely BP range:  BP medication side effects: no Duration of hyperlipidemia:  chronic Cholesterol medication side effects: no Cholesterol supplements: none Medication compliance: good compliance Aspirin: yes Recent stressors: no Recurrent headaches: no Visual changes: no Palpitations: no Dyspnea: no Chest pain: no Lower extremity edema: no Dizzy/lightheaded: no  The ASCVD Risk score (Arnett DK, et al., 2019) failed to calculate for the following reasons:   The patient has a prior MI or stroke diagnosis  Relevant past medical, surgical, family and social history reviewed and updated as indicated. Interim medical history since our last visit reviewed. Allergies and medications reviewed and updated.  Review of Systems  Constitutional:  Negative for activity change, diaphoresis, fatigue and fever.  Respiratory:  Negative for cough, chest tightness, shortness of breath and wheezing.   Cardiovascular:  Negative for chest pain, palpitations and leg swelling.  Gastrointestinal: Negative.   Neurological: Negative.   Psychiatric/Behavioral: Negative.      Per HPI unless specifically indicated above     Objective:    BP 136/82 (BP Location: Left Arm)   Pulse 76   Temp 98 F (36.7 C) (Oral)   Resp 18   Ht 5' 10.98" (1.803 m)   Wt (!) 320 lb 12.8 oz (145.5 kg)   SpO2 96%   BMI 44.76 kg/m   Wt Readings from Last 3 Encounters:  12/26/22 (!) 320 lb 12.8 oz (145.5 kg)  09/26/22 (!) 329 lb 6.4 oz (149.4 kg)  03/25/22 (!) 323 lb 11.2 oz (146.8 kg)    Physical Exam Vitals and nursing note reviewed.  Constitutional:  General: He is awake. He is not in acute distress.    Appearance: He is well-developed and well-groomed. He is obese. He is not ill-appearing or toxic-appearing.  HENT:     Head: Normocephalic and atraumatic.     Right Ear: Hearing normal. No drainage.     Left Ear: Hearing normal. No drainage.  Eyes:     General: Lids are normal.        Right eye: No discharge.        Left eye: No discharge.     Conjunctiva/sclera: Conjunctivae normal.      Pupils: Pupils are equal, round, and reactive to light.  Neck:     Thyroid: No thyromegaly.     Vascular: No carotid bruit.  Cardiovascular:     Rate and Rhythm: Normal rate and regular rhythm.     Heart sounds: Normal heart sounds, S1 normal and S2 normal. No murmur heard.    No gallop.  Pulmonary:     Effort: Pulmonary effort is normal. No accessory muscle usage or respiratory distress.     Breath sounds: Normal breath sounds.  Abdominal:     General: Bowel sounds are normal. There is no distension.     Palpations: Abdomen is soft.     Tenderness: There is no abdominal tenderness.  Musculoskeletal:        General: Normal range of motion.     Cervical back: Normal range of motion and neck supple.     Right lower leg: No edema.     Left lower leg: No edema.  Skin:    General: Skin is warm and dry.     Capillary Refill: Capillary refill takes less than 2 seconds.     Findings: No rash.  Neurological:     Mental Status: He is alert and oriented to person, place, and time.     Gait: Gait is intact.     Deep Tendon Reflexes: Reflexes are normal and symmetric.     Reflex Scores:      Brachioradialis reflexes are 2+ on the right side and 2+ on the left side.      Patellar reflexes are 2+ on the right side and 2+ on the left side. Psychiatric:        Attention and Perception: Attention normal.        Mood and Affect: Mood normal.        Speech: Speech normal.        Behavior: Behavior normal. Behavior is cooperative.        Thought Content: Thought content normal.    Results for orders placed or performed in visit on 12/26/22  Bayer DCA Hb A1c Waived  Result Value Ref Range   HB A1C (BAYER DCA - WAIVED) 7.6 (H) 4.8 - 5.6 %  Microalbumin, Urine Waived  Result Value Ref Range   Microalb, Ur Waived 30 (H) 0 - 19 mg/L   Creatinine, Urine Waived 200 10 - 300 mg/dL   Microalb/Creat Ratio <30 <30 mg/g      Assessment & Plan:   Problem List Items Addressed This Visit        Cardiovascular and Mediastinum   Aortic atherosclerosis (HCC)    Chronic.  Noted on CXR 12/25/20.  Recommend continuation of statin daily, avoid ASA at this time due to recent bleed.      Relevant Medications   sildenafil (VIAGRA) 100 MG tablet   Other Relevant Orders   Comprehensive metabolic panel   Lipid Panel  w/o Chol/HDL Ratio   Hypertension associated with diabetes (HCC)    Chronic, stable . BP at goal on recheck today and at goal at home. Continue current medication regimen and recommend DASH diet at home, lowering sodium in diet to avoid fluid retention. Adjust regimen as needed.  Continue to collaborate with cardiology as needed.  Labs: CMP.  Will further consider discontinuation of Chlorthalidone if ongoing gout flares and possibly need to start Hydralazine or Clonidine -- avoid BB due to low resting HR.  Urine ALB 03 January 2023.      Relevant Medications   sildenafil (VIAGRA) 100 MG tablet   metFORMIN (GLUCOPHAGE) 500 MG tablet   Other Relevant Orders   Comprehensive metabolic panel     Respiratory   OSA on CPAP    Chronic, stable -- continue 100% use of CPAP at home.          Endocrine   Hyperlipidemia associated with type 2 diabetes mellitus (HCC)    Chronic, ongoing.  Continue current medication regimen and adjust as needed -- is on three days weekly Crestor due to myalgia with increased doses + continue Zetia daily  Will plan on repeat CMP and lipids. 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  A1c today trending up to 7.6%.  Urine ALB 03 January 2023.  Discussed at this point treatment would be beneficial.  He agrees with this  plan.  Discussed options to include Metformin, GLP1, SGLT2, DPP4.  At this time he would like to start with oral medication and prefers to try Metformin.  Educated him on side effects and how medication works.  Offered referral to diabetes education, he declined at this time. - Start Metformin 500 MG BID for one week and then increase to 1000 MG BID.  Recent kidney function stable, eGFR 81. - Glucometer supplies sent in and BS log provided.  Recommend he check fasting sugar daily + occasional BS check 2 hours after a meal and document for provider.   - Continue ARB and statin therapy - Recommend he obtain eye exam.  Foot exam.      Relevant Medications   metFORMIN (GLUCOPHAGE) 500 MG tablet   Other Relevant Orders   Bayer DCA Hb A1c Waived (Completed)   Microalbumin, Urine Waived (Completed)     Other   Elevated LFTs    Recheck CMP today.      History of stroke    In August 2020 and intraparenchymal bleed June 2022. Continue statin daily and avoid ASA due to past bleed.  BP at goal today.  Check A1c, CMP, CBC. Educated him on stroke prevention goals: BP <130/90 LDL <70 A1C <6.5       Morbid obesity (HCC)    BMI 44.76 with HTN/HLD, T2DM.  Has lost 9 pounds. Recommended eating smaller high protein, low fat meals more frequently and exercising 30 mins a day 5 times a week with a goal of 10-15lb weight loss in the next 3 months. Patient voiced their understanding and motivation to adhere to these recommendations.       Relevant Medications   metFORMIN (  GLUCOPHAGE) 500 MG tablet     Follow up plan: Return in about 4 weeks (around 01/23/2023) for T2DM -- started Metformin.

## 2022-12-26 NOTE — Assessment & Plan Note (Signed)
BMI 44.76 with HTN/HLD, T2DM.  Has lost 9 pounds. Recommended eating smaller high protein, low fat meals more frequently and exercising 30 mins a day 5 times a week with a goal of 10-15lb weight loss in the next 3 months. Patient voiced their understanding and motivation to adhere to these recommendations.

## 2022-12-26 NOTE — Assessment & Plan Note (Signed)
Chronic, stable . BP at goal on recheck today and at goal at home. Continue current medication regimen and recommend DASH diet at home, lowering sodium in diet to avoid fluid retention. Adjust regimen as needed.  Continue to collaborate with cardiology as needed.  Labs: CMP.  Will further consider discontinuation of Chlorthalidone if ongoing gout flares and possibly need to start Hydralazine or Clonidine -- avoid BB due to low resting HR.  Urine ALB 03 January 2023.

## 2022-12-26 NOTE — Assessment & Plan Note (Signed)
Recheck CMP today.  °

## 2022-12-26 NOTE — Assessment & Plan Note (Signed)
Chronic.  Noted on CXR 12/25/20.  Recommend continuation of statin daily, avoid ASA at this time due to recent bleed.

## 2022-12-27 LAB — COMPREHENSIVE METABOLIC PANEL
ALT: 68 IU/L — ABNORMAL HIGH (ref 0–44)
AST: 69 IU/L — ABNORMAL HIGH (ref 0–40)
Albumin: 4.4 g/dL (ref 3.9–4.9)
Alkaline Phosphatase: 80 IU/L (ref 44–121)
BUN/Creatinine Ratio: 16 (ref 10–24)
BUN: 20 mg/dL (ref 8–27)
Bilirubin Total: 0.6 mg/dL (ref 0.0–1.2)
CO2: 23 mmol/L (ref 20–29)
Calcium: 9.3 mg/dL (ref 8.6–10.2)
Chloride: 102 mmol/L (ref 96–106)
Creatinine, Ser: 1.22 mg/dL (ref 0.76–1.27)
Globulin, Total: 2.5 g/dL (ref 1.5–4.5)
Glucose: 148 mg/dL — ABNORMAL HIGH (ref 70–99)
Potassium: 3.7 mmol/L (ref 3.5–5.2)
Sodium: 141 mmol/L (ref 134–144)
Total Protein: 6.9 g/dL (ref 6.0–8.5)
eGFR: 67 mL/min/{1.73_m2} (ref >59)

## 2022-12-27 LAB — LIPID PANEL W/O CHOL/HDL RATIO
Cholesterol, Total: 116 mg/dL (ref 100–199)
HDL: 39 mg/dL — ABNORMAL LOW (ref >39)
LDL Chol Calc (NIH): 50 mg/dL (ref 0–99)
Triglycerides: 161 mg/dL — ABNORMAL HIGH (ref 0–149)
VLDL Cholesterol Cal: 27 mg/dL (ref 5–40)

## 2022-12-27 NOTE — Progress Notes (Signed)
Contacted via MyChart   Good afternoon Paul Cannon, your labs have returned: - Kidney function, creatinine and eGFR, remains normal.  Liver function is showing some elevation still, some of this may be related to diabetes.  We will see if this improves with getting sugars under control.  May consider an ultrasound in future to check on liver.  Try to reduce any alcohol or Tylenol use which can irritate liver. - Cholesterol levels show goal LDL, but triglycerides remain a little elevated, continue current regimen and we will see if these improve with getting sugar under control.  Any questions? Keep being amazing!!  Thank you for allowing me to participate in your care.  I appreciate you. Kindest regards, Leyah Bocchino

## 2023-01-14 ENCOUNTER — Other Ambulatory Visit: Payer: Self-pay | Admitting: Nurse Practitioner

## 2023-01-16 ENCOUNTER — Other Ambulatory Visit: Payer: Self-pay | Admitting: Nurse Practitioner

## 2023-01-16 NOTE — Telephone Encounter (Signed)
Requested Prescriptions  Pending Prescriptions Disp Refills   ezetimibe (ZETIA) 10 MG tablet [Pharmacy Med Name: EZETIMIBE 10 MG TABLET] 90 tablet 0    Sig: TAKE 1 TABLET BY MOUTH EVERY DAY     Cardiovascular:  Antilipid - Sterol Transport Inhibitors Failed - 01/14/2023  8:48 AM      Failed - AST in normal range and within 360 days    AST  Date Value Ref Range Status  12/26/2022 69 (H) 0 - 40 IU/L Final         Failed - ALT in normal range and within 360 days    ALT  Date Value Ref Range Status  12/26/2022 68 (H) 0 - 44 IU/L Final         Failed - Lipid Panel in normal range within the last 12 months    Cholesterol, Total  Date Value Ref Range Status  12/26/2022 116 100 - 199 mg/dL Final   Cholesterol Piccolo, Waived  Date Value Ref Range Status  07/08/2019 100 <200 mg/dL Final    Comment:                            Desirable                <200                         Borderline High      200- 239                         High                     >239    LDL Chol Calc (NIH)  Date Value Ref Range Status  12/26/2022 50 0 - 99 mg/dL Final   HDL  Date Value Ref Range Status  12/26/2022 39 (L) >39 mg/dL Final   Triglycerides  Date Value Ref Range Status  12/26/2022 161 (H) 0 - 149 mg/dL Final   Triglycerides Piccolo,Waived  Date Value Ref Range Status  07/08/2019 100 <150 mg/dL Final    Comment:                            Normal                   <150                         Borderline High     150 - 199                         High                200 - 499                         Very High                >499          Passed - Patient is not pregnant      Passed - Valid encounter within last 12 months    Recent Outpatient Visits           3 weeks ago Type 2 diabetes mellitus with obesity (HCC)  [place] [facility] [place], Corrie Dandy T, NP   3 months ago Morbid obesity [facility])   [place] [facility] [place], Corrie Dandy T, NP   9  months ago Morbid obesity [facility])   [place] [facility] [place], Corrie Dandy T, NP   1 year ago Aortic atherosclerosis (HCC)   [place] [facility], Corrie Dandy T, NP   1 year ago 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

## 2023-01-17 NOTE — Telephone Encounter (Signed)
Requested medications are due for refill today.  yes  Requested medications are on the active medications list.  yes  Last refill. 11/23/2022 #30 1 rf  Future visit scheduled.   yes  Notes to clinic.   Refill not delegated.    Requested Prescriptions  Pending Prescriptions Disp Refills   cyclobenzaprine (FLEXERIL) 10 MG tablet [Pharmacy Med Name: CYCLOBENZAPRINE 10 MG TABLET] 30 tablet 1    Sig: TAKE 1 TABLET BY MOUTH EVERY DAY AT BEDTIME AS NEEDED     Not Delegated - Analgesics:  Muscle Relaxants Failed - 01/16/2023  2:29 AM      Failed - This refill cannot be delegated      Passed - Valid encounter within last 6 months    Recent Outpatient Visits           3 weeks ago Type 2 diabetes mellitus with obesity (HCC)   [place] [facility] [place], [employer] T, NP   3 months ago Morbid obesity (HCC)   [place] [facility] [place], Corrie Dandy T, NP   9 months ago Morbid obesity (HCC)   [place] [facility] [place], Corrie Dandy T, NP   1 year ago Aortic atherosclerosis (HCC)   [place] [facility] [place], Corrie Dandy T, NP   1 year ago 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

## 2023-01-22 NOTE — Patient Instructions (Signed)
 Be Involved in Caring For Your Health:  Taking Medications When medications are taken as directed, they can greatly improve your health. But if they are not taken as prescribed, they may not work. In some cases, not taking them correctly can be harmful. To help ensure your treatment remains effective and safe, understand your medications and how to take them. Bring your medications to each visit for review by your provider.  Your lab results, notes, and after visit summary will be available on My Chart. We strongly encourage you to use this feature. If lab results are abnormal the clinic will contact you with the appropriate steps. If the clinic does not contact you assume the results are satisfactory. You can always view your results on My Chart. If you have questions regarding your health or results, please contact the clinic during office hours. You can also ask questions on My Chart.  We at [facility] are grateful that you chose [place] to provide your care. We strive to provide evidence-based and compassionate care and are always looking for feedback. If you get a survey from the clinic please complete this so we can hear your opinions.  Diabetes Mellitus and Nutrition, Adult When you have diabetes, or diabetes mellitus, it is very important to have healthy eating habits because your blood sugar (glucose) levels are greatly affected by what you eat and drink. Eating healthy foods in the right amounts, at about the same times every day, can help you: Manage your blood glucose. Lower your risk of heart disease. Improve your blood pressure. Reach or maintain a healthy weight. What can affect my meal plan? Every person with diabetes is different, and each person has different needs for a meal plan. Your health care provider may recommend that you work with a dietitian to make a meal plan that is best for you. Your meal plan may vary depending on factors such as: The calories you need. The  medicines you take. Your weight. Your blood glucose, blood pressure, and cholesterol levels. Your activity level. Other health conditions you have, such as heart or kidney disease. How do carbohydrates affect me? Carbohydrates, also called carbs, affect your blood glucose level more than any other type of food. Eating carbs raises the amount of glucose in your blood. It is important to know how many carbs you can safely have in each meal. This is different for every person. Your dietitian can help you calculate how many carbs you should have at each meal and for each snack. How does alcohol affect me? Alcohol can cause a decrease in blood glucose (hypoglycemia), especially if you use insulin or take certain diabetes medicines by mouth. Hypoglycemia can be a life-threatening condition. Symptoms of hypoglycemia, such as sleepiness, dizziness, and confusion, are similar to symptoms of having too much alcohol. Do not drink alcohol if: Your health care provider tells you not to drink. You are pregnant, may be pregnant, or are planning to become pregnant. If you drink alcohol: Limit how much you have to: 0-1 drink a day for women. 0-2 drinks a day for men. Know how much alcohol is in your drink. In the [place] one drink equals one 12 oz bottle of beer (355 mL), one 5 oz glass of wine (148 mL), or one 1 oz glass of hard liquor (44 mL). Keep yourself hydrated with water, diet soda, or unsweetened iced tea. Keep in mind that regular soda, juice, and other mixers may contain a lot of sugar and must  be counted as carbs. What are tips for following this plan?  Reading food labels Start by checking the serving size on the Nutrition Facts label of packaged foods and drinks. The number of calories and the amount of carbs, fats, and other nutrients listed on the label are based on one serving of the item. Many items contain more than one serving per package. Check the total grams (g) of carbs in one  serving. Check the number of grams of saturated fats and trans fats in one serving. Choose foods that have a low amount or none of these fats. Check the number of milligrams (mg) of salt (sodium) in one serving. Most people should limit total sodium intake to less than 2,300 mg per day. Always check the nutrition information of foods labeled as "low-fat" or "nonfat." These foods may be higher in added sugar or refined carbs and should be avoided. Talk to your dietitian to identify your daily goals for nutrients listed on the label. Shopping Avoid buying canned, pre-made, or processed foods. These foods tend to be high in fat, sodium, and added sugar. Shop around the outside edge of the grocery store. This is where you will most often find fresh fruits and vegetables, bulk grains, fresh meats, and fresh dairy products. Cooking Use low-heat cooking methods, such as baking, instead of high-heat cooking methods, such as deep frying. Cook using healthy oils, such as olive, canola, or sunflower oil. Avoid cooking with butter, cream, or high-fat meats. Meal planning Eat meals and snacks regularly, preferably at the same times every day. Avoid going long periods of time without eating. Eat foods that are high in fiber, such as fresh fruits, vegetables, beans, and whole grains. Eat 4-6 oz (112-168 g) of lean protein each day, such as lean meat, chicken, fish, eggs, or tofu. One ounce (oz) (28 g) of lean protein is equal to: 1 oz (28 g) of meat, chicken, or fish. 1 egg.  cup (62 g) of tofu. Eat some foods each day that contain healthy fats, such as avocado, nuts, seeds, and fish. What foods should I eat? Fruits Berries. Apples. Oranges. Peaches. Apricots. Plums. Grapes. Mangoes. Papayas. Pomegranates. Kiwi. Cherries. Vegetables Leafy greens, including lettuce, spinach, kale, chard, collard greens, mustard greens, and cabbage. Beets. Cauliflower. Broccoli. Carrots. Green beans. Tomatoes. Peppers.  Onions. Cucumbers. Brussels sprouts. Grains Whole grains, such as whole-wheat or whole-grain bread, crackers, tortillas, cereal, and pasta. Unsweetened oatmeal. Quinoa. Brown or wild rice. Meats and other proteins Seafood. Poultry without skin. Lean cuts of poultry and beef. Tofu. Nuts. Seeds. Dairy Low-fat or fat-free dairy products such as milk, yogurt, and cheese. The items listed above may not be a complete list of foods and beverages you can eat and drink. Contact a dietitian for more information. What foods should I avoid? Fruits Fruits canned with syrup. Vegetables Canned vegetables. Frozen vegetables with butter or cream sauce. Grains Refined white flour and flour products such as bread, pasta, snack foods, and cereals. Avoid all processed foods. Meats and other proteins Fatty cuts of meat. Poultry with skin. Breaded or fried meats. Processed meat. Avoid saturated fats. Dairy Full-fat yogurt, cheese, or milk. Beverages Sweetened drinks, such as soda or iced tea. The items listed above may not be a complete list of foods and beverages you should avoid. Contact a dietitian for more information. Questions to ask a health care provider Do I need to meet with a certified diabetes care and education specialist? Do I need to meet with a  dietitian? What number can I call if I have questions? When are the best times to check my blood glucose? Where to find more information: American Diabetes Association: diabetes.org Academy of Nutrition and Dietetics: eatright.[employer] of Diabetes and Digestive and Kidney Diseases: StageSync.si Association of Diabetes Care & Education Specialists: diabeteseducator.org Summary It is important to have healthy eating habits because your blood sugar (glucose) levels are greatly affected by what you eat and drink. It is important to use alcohol carefully. A healthy meal plan will help you manage your blood glucose and lower your risk of  heart disease. Your health care provider may recommend that you work with a dietitian to make a meal plan that is best for you. This information is not intended to replace advice given to you by your health care provider. Make sure you discuss any questions you have with your health care provider. Document Revised: 12/25/2019 Document Reviewed: 12/25/2019 Elsevier Patient Education  2024 [employer].

## 2023-01-24 ENCOUNTER — Ambulatory Visit (INDEPENDENT_AMBULATORY_CARE_PROVIDER_SITE_OTHER): Payer: Commercial Managed Care - PPO | Admitting: Nurse Practitioner

## 2023-01-24 ENCOUNTER — Encounter: Payer: Self-pay | Admitting: Nurse Practitioner

## 2023-01-24 VITALS — BP 113/69 | HR 60 | Temp 98.1°F | Ht 71.0 in | Wt 316.0 lb

## 2023-01-24 DIAGNOSIS — E669 Obesity, unspecified: Secondary | ICD-10-CM | POA: Diagnosis not present

## 2023-01-24 DIAGNOSIS — E1169 Type 2 diabetes mellitus with other specified complication: Secondary | ICD-10-CM

## 2023-01-24 DIAGNOSIS — Z7984 Long term (current) use of oral hypoglycemic drugs: Secondary | ICD-10-CM

## 2023-01-24 MED ORDER — ONETOUCH VERIO VI STRP: ORAL_STRIP | 12 refills | Status: DC

## 2023-01-24 NOTE — Progress Notes (Signed)
BP 113/69   Pulse 60   Temp 98.1 F (36.7 C) (Oral)   Ht 5\' 11"  (1.803 m)   Wt (!) 316 lb (143.3 kg)   SpO2 98%   BMI 44.07 kg/m    Subjective:    Patient ID: Paul Cannon, male    DOB: [birth year], 62 y.o.   MRN: [id number]  HPI: Paul Cannon is a 62 y.o. male  Chief Complaint  Patient presents with   Diabetes    No recent eye exam per patient   DIABETES Diagnosed on 12/26/22 and Metformin started.  His A1c at time was 7.6%.  Tolerated 500 MG BID dosing, initially got queasy with 1000 MG BID -- now this has improved.  Has not been able to check sugars, was given wrong test strips.  Has lost 13 pounds since April, 4 of these pounds have been since last visit.  He is working on diet changes and cutting back on sweet tea. Hypoglycemic episodes:no Polydipsia/polyuria: no Visual disturbance: no Chest pain: no Paresthesias: no Glucose Monitoring: no  Accucheck frequency: Not Checking  Fasting glucose:  Post prandial:  Evening:  Before meals: Taking Insulin?: no  Long acting insulin:  Short acting insulin: Blood Pressure Monitoring: not checking Retinal Examination: Not up to Date - Dr. Clearance Coots at [facility] Foot Exam: Up to Date Pneumovax: Not up to Date Influenza: Up to Date Aspirin: no   BMI Metric Follow Up - 01/24/23 1047       BMI Metric Follow Up-Please document annually   BMI Metric Follow Up Nutrition counseling              Relevant past medical, surgical, family and social history reviewed and updated as indicated. Interim medical history since our last visit reviewed. Allergies and medications reviewed and updated.  Review of Systems  Constitutional:  Negative for activity change, diaphoresis, fatigue and fever.  Respiratory:  Negative for cough, chest tightness, shortness of breath and wheezing.   Cardiovascular:  Negative for chest pain, palpitations and leg swelling.  Gastrointestinal: Negative.   Neurological: Negative.    Psychiatric/Behavioral: Negative.      Per HPI unless specifically indicated above     Objective:    BP 113/69   Pulse 60   Temp 98.1 F (36.7 C) (Oral)   Ht 5\' 11"  (1.803 m)   Wt (!) 316 lb (143.3 kg)   SpO2 98%   BMI 44.07 kg/m   Wt Readings from Last 3 Encounters:  01/24/23 (!) 316 lb (143.3 kg)  12/26/22 (!) 320 lb 12.8 oz (145.5 kg)  09/26/22 (!) 329 lb 6.4 oz (149.4 kg)    Physical Exam Vitals and nursing note reviewed.  Constitutional:      General: He is awake. He is not in acute distress.    Appearance: He is well-developed and well-groomed. He is obese. He is not ill-appearing or toxic-appearing.  HENT:     Head: Normocephalic and atraumatic.     Right Ear: Hearing normal. No drainage.     Left Ear: Hearing normal. No drainage.  Eyes:     General: Lids are normal.        Right eye: No discharge.        Left eye: No discharge.     Conjunctiva/sclera: Conjunctivae normal.     Pupils: Pupils are equal, round, and reactive to light.  Neck:     Thyroid: No thyromegaly.     Vascular: No carotid bruit.  Cardiovascular:  Rate and Rhythm: Normal rate and regular rhythm.     Heart sounds: Normal heart sounds, S1 normal and S2 normal. No murmur heard.    No gallop.  Pulmonary:     Effort: Pulmonary effort is normal. No accessory muscle usage or respiratory distress.     Breath sounds: Normal breath sounds.  Abdominal:     General: Bowel sounds are normal. There is no distension.     Palpations: Abdomen is soft.     Tenderness: There is no abdominal tenderness.  Musculoskeletal:        General: Normal range of motion.     Cervical back: Normal range of motion and neck supple.     Right lower leg: No edema.     Left lower leg: No edema.  Skin:    General: Skin is warm and dry.     Capillary Refill: Capillary refill takes less than 2 seconds.     Findings: No rash.  Neurological:     Mental Status: He is alert and oriented to person, place, and time.      Gait: Gait is intact.     Deep Tendon Reflexes: Reflexes are normal and symmetric.     Reflex Scores:      Brachioradialis reflexes are 2+ on the right side and 2+ on the left side.      Patellar reflexes are 2+ on the right side and 2+ on the left side. Psychiatric:        Attention and Perception: Attention normal.        Mood and Affect: Mood normal.        Speech: Speech normal.        Behavior: Behavior normal. Behavior is cooperative.        Thought Content: Thought content normal.    Results for orders placed or performed in visit on 12/26/22  Bayer DCA Hb A1c Waived  Result Value Ref Range   HB A1C (BAYER DCA - WAIVED) 7.6 (H) 4.8 - 5.6 %  Microalbumin, Urine Waived  Result Value Ref Range   Microalb, Ur Waived 30 (H) 0 - 19 mg/L   Creatinine, Urine Waived 200 10 - 300 mg/dL   Microalb/Creat Ratio <30 <30 mg/g  Comprehensive metabolic panel  Result Value Ref Range   Glucose 148 (H) 70 - 99 mg/dL   BUN 20 8 - 27 mg/dL   Creatinine, Ser 7.82 0.76 - 1.27 mg/dL   eGFR 67 >95 AO/ZHY/8.65   BUN/Creatinine Ratio 16 10 - 24   Sodium 141 134 - 144 mmol/L   Potassium 3.7 3.5 - 5.2 mmol/L   Chloride 102 96 - 106 mmol/L   CO2 23 20 - 29 mmol/L   Calcium 9.3 8.6 - 10.2 mg/dL   Total Protein 6.9 6.0 - 8.5 g/dL   Albumin 4.4 3.9 - 4.9 g/dL   Globulin, Total 2.5 1.5 - 4.5 g/dL   Bilirubin Total 0.6 0.0 - 1.2 mg/dL   Alkaline Phosphatase 80 44 - 121 IU/L   AST 69 (H) 0 - 40 IU/L   ALT 68 (H) 0 - 44 IU/L  Lipid Panel w/o Chol/HDL Ratio  Result Value Ref Range   Cholesterol, Total 116 100 - 199 mg/dL   Triglycerides 784 (H) 0 - 149 mg/dL   HDL 39 (L) >69 mg/dL   VLDL Cholesterol Cal 27 5 - 40 mg/dL   LDL Chol Calc (NIH) 50 0 - 99 mg/dL      Assessment & Plan:  Problem List Items Addressed This Visit       Endocrine   Type 2 diabetes mellitus with obesity (HCC) - Primary    Diagnosed initially April 2024 and wanted to maintain diet focus.  A1c in July was 7.6% and  started Metformin -- he is tolerating 1000 MG BID.  Urine ALB 03 January 2023. Discussed options to include Metformin, GLP1, SGLT2, DPP4.  Will continue Metformin.  Educated him on side effects and how medication works.  Offered referral to diabetes education, he declined at this time. - Continue Metformin 1000 MG BID.  Recent kidney function stable, eGFR 81. - Glucometer supplies present, but wrong strips -- placed new order for correct strips.  Recommend he check fasting sugar daily + occasional BS check 2 hours after a meal and document for provider.   - Continue ARB and statin therapy - Foot exam up to date.  Eye exam will obtain in new year when due and tell provider he is diabetic now. - Return in October for A1c check. - Recommend he obtain eye exam.  Foot exam.        Follow up plan: Return in about 2 months (around 03/29/2023) for T2DM, HTN/HLD.

## 2023-01-24 NOTE — Assessment & Plan Note (Signed)
Diagnosed initially April 2024 and wanted to maintain diet focus.  A1c in July was 7.6% and started Metformin -- he is tolerating 1000 MG BID.  Urine ALB 03 January 2023. Discussed options to include Metformin, GLP1, SGLT2, DPP4.  Will continue Metformin.  Educated him on side effects and how medication works.  Offered referral to diabetes education, he declined at this time. - Continue Metformin 1000 MG BID.  Recent kidney function stable, eGFR 81. - Glucometer supplies present, but wrong strips -- placed new order for correct strips.  Recommend he check fasting sugar daily + occasional BS check 2 hours after a meal and document for provider.   - Continue ARB and statin therapy - Foot exam up to date.  Eye exam will obtain in new year when due and tell provider he is diabetic now. - Return in October for A1c check. - Recommend he obtain eye exam.  Foot exam.

## 2023-01-31 ENCOUNTER — Other Ambulatory Visit: Payer: Self-pay | Admitting: Nurse Practitioner

## 2023-02-01 ENCOUNTER — Telehealth: Payer: Self-pay | Admitting: Nurse Practitioner

## 2023-02-01 NOTE — Telephone Encounter (Signed)
Pt is calling back stating that [facility] to [facility] and they would not refill the new prescription that his PCP sent over yesterday for the correct test strips for his blood monitor machine. [facility] told pt they are confused and need someone from the office to call and explain to them.   Blood Glucose Monitoring Suppl (ONETOUCH VERIO FLEX SYSTEM) w/Device KIT    Pt states that he has the monitor but [facility] gave him the wrong test strips. Please advise.      [id number] [place] [place] - [address]  Phone: [phone] Fax: [phone]

## 2023-02-02 ENCOUNTER — Ambulatory Visit: Payer: Self-pay

## 2023-02-02 NOTE — Telephone Encounter (Signed)
Request too early. Refilled 12/26/22 # 100 with 12 refills. Requested Prescriptions  Refused Prescriptions Disp Refills   Blood Glucose Monitoring Suppl (ONETOUCH VERIO FLEX SYSTEM) w/Device KIT [Pharmacy Med Name: ONE TOUCH VERIO FLEX SYS KIT]      Sig: USE TO CHECK BLOOD SUGAR 3 TIMES A DAY AND DOCUMENT RESULTS, BRING TO APPOINTMENTS. GOAL IS <130 FASTING BLOOD SUGAR AND <180 TWO HOURS AFTER MEALS.     Endocrinology: Diabetes - Testing Supplies Passed - 01/31/2023  8:17 PM      Passed - Valid encounter within last 12 months    Recent Outpatient Visits           1 week ago Type 2 diabetes mellitus with obesity (HCC)   [place] [facility] [place], [place] T, NP   1 month ago Type 2 diabetes mellitus with obesity (HCC)   [place] [facility] [place], Corrie Dandy T, NP   4 months ago Morbid obesity (HCC)   [place] [facility] [place], Corrie Dandy T, NP   10 months ago Morbid obesity (HCC)   [place] [facility] [place], Corrie Dandy T, NP   1 year ago Aortic atherosclerosis (HCC)   [place] [facility] [place], Dorie Rank, NP       Future Appointments             In 1 month [place], Dorie Rank, NP [facility], [facility]

## 2023-02-02 NOTE — Telephone Encounter (Signed)
  Chief Complaint: wrong strips received from pharmacy  Disposition: [] ED /[] Urgent Care (no appt availability in office) / [] Appointment(In office/virtual)/ []  [place] [facility]/ [x] Home Care/ [] Refused Recommended Disposition /[] [place] [facility]/ []  Follow-up with PCP Additional Notes: called pt's pharmacy and spoke with Joe. Advised why pt called and asked if got order in July and [facility] for correct strips (verio). Advised pt got wrog type strips and verified that lated order was received. Per chart pt is to take blood sugar up to 4 times a day. Pharmacist reentered and added frequency and insurance covered it with a small copay $ 8.24 Reason for Disposition  [1] Caller has URGENT medicine question about med that PCP or specialist prescribed AND [2] triager unable to answer question  Answer Assessment - Initial Assessment Questions 1. NAME of MEDICINE: "What medicine(s) are you calling about?"     Verio test strips  2. QUESTION: "What is your question?" (e.g., double dose of medicine, side effect)     Pt stated he received the wrong strips and asking if new order sent to pharmacy for the correct strips 3. PRESCRIBER: "Who prescribed the medicine?" Reason: if prescribed by specialist, call should be referred to that group.     PCP  Protocols used: Medication Question Call-A-AH

## 2023-02-03 NOTE — Telephone Encounter (Signed)
See other phone encounter.  

## 2023-02-12 ENCOUNTER — Other Ambulatory Visit: Payer: Self-pay | Admitting: Nurse Practitioner

## 2023-02-14 NOTE — Telephone Encounter (Signed)
Requested Prescriptions  Pending Prescriptions Disp Refills   meloxicam (MOBIC) 7.5 MG tablet [Pharmacy Med Name: MELOXICAM 7.5 MG TABLET] 90 tablet 0    Sig: TAKE 1 TABLET BY MOUTH EVERY DAY     Analgesics:  COX2 Inhibitors Failed - 02/12/2023  8:37 AM      Failed - Manual Review: Labs are only required if the patient has taken medication for more than 8 weeks.      Failed - AST in normal range and within 360 days    AST  Date Value Ref Range Status  12/26/2022 69 (H) 0 - 40 IU/L Final         Failed - ALT in normal range and within 360 days    ALT  Date Value Ref Range Status  12/26/2022 68 (H) 0 - 44 IU/L Final         Passed - HGB in normal range and within 360 days    Hemoglobin  Date Value Ref Range Status  03/25/2022 14.7 13.0 - 17.7 g/dL Final         Passed - Cr in normal range and within 360 days    Creatinine, Ser  Date Value Ref Range Status  12/26/2022 1.22 0.76 - 1.27 mg/dL Final         Passed - HCT in normal range and within 360 days    Hematocrit  Date Value Ref Range Status  03/25/2022 43.9 37.5 - 51.0 % Final         Passed - eGFR is 30 or above and within 360 days    GFR calc Af Amer  Date Value Ref Range Status  07/21/2020 85 >59 mL/min/1.73 Final    Comment:    **In accordance with recommendations from the NKF-ASN Task force,**   [facility] is in the process of updating its eGFR calculation to the   2021 CKD-EPI creatinine equation that estimates kidney function   without a race variable.    GFR calc non Af Amer  Date Value Ref Range Status  07/21/2020 74 >59 mL/min/1.73 Final   eGFR  Date Value Ref Range Status  12/26/2022 67 >59 mL/min/1.73 Final         Passed - Patient is not pregnant      Passed - Valid encounter within last 12 months    Recent Outpatient Visits           3 weeks ago Type 2 diabetes mellitus with obesity (HCC)   [place] [facility] [place], [place] T, NP   1 month ago Type 2 diabetes mellitus  with obesity (HCC)   [place] [facility] [place], Corrie Dandy T, NP   4 months ago Morbid obesity (HCC)   [place] [facility] [place], Corrie Dandy T, NP   10 months ago Morbid obesity (HCC)   [place] [facility] [place], Corrie Dandy T, NP   1 year ago Aortic atherosclerosis (HCC)   [place] [facility] [place], Dorie Rank, NP       Future Appointments             In 1 month Cannady, Dorie Rank, NP [place] [facility]

## 2023-03-12 ENCOUNTER — Other Ambulatory Visit: Payer: Self-pay | Admitting: Nurse Practitioner

## 2023-03-13 NOTE — Telephone Encounter (Signed)
Requested medication (s) are due for refill today - yes  Requested medication (s) are on the active medication list -yes  Future visit scheduled -yes  Last refill: 01/17/23 #30 1RF  Notes to clinic: non delegated Rx  Requested Prescriptions  Pending Prescriptions Disp Refills   cyclobenzaprine (FLEXERIL) 10 MG tablet [Pharmacy Med Name: CYCLOBENZAPRINE 10 MG TABLET] 30 tablet 1    Sig: TAKE 1 TABLET BY MOUTH EVERY DAY AT BEDTIME AS NEEDED     Not Delegated - Analgesics:  Muscle Relaxants Failed - 03/12/2023  9:55 AM      Failed - This refill cannot be delegated      Passed - Valid encounter within last 6 months    Recent Outpatient Visits           1 month ago Type 2 diabetes mellitus with obesity (HCC)   [place] [facility] [place], [place] T, NP   2 months ago Type 2 diabetes mellitus with obesity (HCC)   [place] [facility] [place], [place] T, NP   5 months ago Morbid obesity (HCC)   [place] [facility] [place], Corrie Dandy T, NP   11 months ago Morbid obesity (HCC)   [place] [facility] [place], Corrie Dandy T, NP   1 year ago Aortic atherosclerosis (HCC)   [place] [facility] [place], Dorie Rank, NP       Future Appointments             In 2 weeks Cannady, Dorie Rank, NP [place] [facility]               Requested Prescriptions  Pending Prescriptions Disp Refills   cyclobenzaprine (FLEXERIL) 10 MG tablet [Pharmacy Med Name: CYCLOBENZAPRINE 10 MG TABLET] 30 tablet 1    Sig: TAKE 1 TABLET BY MOUTH EVERY DAY AT BEDTIME AS NEEDED     Not Delegated - Analgesics:  Muscle Relaxants Failed - 03/12/2023  9:55 AM      Failed - This refill cannot be delegated      Passed - Valid encounter within last 6 months    Recent Outpatient Visits           1 month ago Type 2 diabetes mellitus with obesity (HCC)   [place] [facility] [place], [place] T, NP   2 months  ago Type 2 diabetes mellitus with obesity (HCC)   [place] [facility] [place], [place] T, NP   5 months ago Morbid obesity (HCC)   [place] [facility] [place], Corrie Dandy T, NP   11 months ago Morbid obesity (HCC)   [place] [facility] [place], Corrie Dandy T, NP   1 year ago Aortic atherosclerosis (HCC)   [place] [facility] [place], Dorie Rank, NP       Future Appointments             In 2 weeks Cannady, Dorie Rank, NP  [facility], [facility]

## 2023-03-26 NOTE — Patient Instructions (Signed)
Be Involved in Caring For Your Health:  Taking Medications When medications are taken as directed, they can greatly improve your health. But if they are not taken as prescribed, they may not work. In some cases, not taking them correctly can be harmful. To help ensure your treatment remains effective and safe, understand your medications and how to take them. Bring your medications to each visit for review by your provider.  Your lab results, notes, and after visit summary will be available on My Chart. We strongly encourage you to use this feature. If lab results are abnormal the clinic will contact you with the appropriate steps. If the clinic does not contact you assume the results are satisfactory. You can always view your results on My Chart. If you have questions regarding your health or results, please contact the clinic during office hours. You can also ask questions on My Chart.  We at [facility] are grateful that you chose [place] to provide your care. We strive to provide evidence-based and compassionate care and are always looking for feedback. If you get a survey from the clinic please complete this so we can hear your opinions.  Diabetes Mellitus and Exercise Regular exercise is important for your health, especially if you have diabetes mellitus. Exercise is not just about losing weight. It can also help you increase muscle strength and bone density and reduce body fat and stress. This can help your level of endurance and make you more fit and flexible. Why should I exercise if I have diabetes? Exercise has many benefits for people with diabetes. It can: Help lower and control your blood sugar (glucose). Help your body respond better and become more sensitive to the hormone insulin. Reduce how much insulin your body needs. Lower your risk for heart disease by: Lowering how much "bad" cholesterol and triglycerides you have in your body. Increasing how much "good" cholesterol  you have in your body. Lowering your blood pressure. Lowering your blood glucose levels. What is my activity plan? Your health care provider or an expert trained in diabetes care (certified diabetes educator) can help you make an activity plan. This plan can help you find the type of exercise that works for you. It may also tell you how often to exercise and for how long. Be sure to: Get at least 150 minutes of medium-intensity or high-intensity exercise each week. This may involve brisk walking, biking, or water aerobics. Do stretching and strengthening exercises at least 2 times a week. This may involve yoga or weight lifting. Spread out your activity over at least 3 days of the week. Get some form of physical activity each day. Do not go more than 2 days in a row without some kind of activity. Avoid being inactive for more than 30 minutes at a time. Take frequent breaks to walk or stretch. Choose activities that you enjoy. Set goals that you know you can accomplish. Start slowly and increase the intensity of your exercise over time. How do I manage my diabetes during exercise?  Monitor your blood glucose Check your blood glucose before and after you exercise. If your blood glucose is 240 mg/dL (40.9 mmol/L) or higher before you exercise, check your urine for ketones. These are chemicals created by the liver. If you have ketones in your urine, do not exercise until your blood glucose returns to normal. If your blood glucose is 100 mg/dL (5.6 mmol/L) or lower, eat a snack that has 15-20 grams of carbohydrate in  it. Check your blood glucose 15 minutes after the snack to make sure that your level is above 100 mg/dL (5.6 mmol/L) before you start to exercise. Your risk for low blood glucose (hypoglycemia) goes up during and after exercise. Know the symptoms of this condition and how to treat it. Follow these instructions at home: Keep a carbohydrate snack on hand for use before, during, and after  exercise. This can help prevent or treat hypoglycemia. Avoid injecting insulin into parts of your body that are going to be used during exercise. This may include: Your arms, when you are going to play tennis. Your legs, when you are about to go jogging. Keep track of your exercise habits. This can help you and your health care provider watch and adjust your activity plan. Write down: What you eat before and after you exercise. Blood glucose levels before and after you exercise. The type and amount of exercise you do. Talk to your health care provider before you start a new activity. They may need to: Make sure that the activity is safe for you. Adjust your insulin, other medicines, and food that you eat. Drink water while you exercise. This can stop you from losing too much water (dehydration). It can also prevent problems caused by having a lot of heat in your body (heat stroke). Where to find more information American Diabetes Association: diabetes.org Association of Diabetes Care & Education Specialists: diabeteseducator.org This information is not intended to replace advice given to you by your health care provider. Make sure you discuss any questions you have with your health care provider. Document Revised: 11/10/2021 Document Reviewed: 11/10/2021 Elsevier Patient Education  2024 [employer].

## 2023-03-28 ENCOUNTER — Ambulatory Visit: Payer: Commercial Managed Care - PPO | Admitting: Nurse Practitioner

## 2023-03-28 ENCOUNTER — Ambulatory Visit (INDEPENDENT_AMBULATORY_CARE_PROVIDER_SITE_OTHER): Payer: Commercial Managed Care - PPO | Admitting: Nurse Practitioner

## 2023-03-28 ENCOUNTER — Encounter: Payer: Self-pay | Admitting: Nurse Practitioner

## 2023-03-28 VITALS — BP 121/74 | HR 54 | Temp 97.8°F | Ht 71.0 in | Wt 315.4 lb

## 2023-03-28 DIAGNOSIS — M1A0720 Idiopathic chronic gout, left ankle and foot, without tophus (tophi): Secondary | ICD-10-CM

## 2023-03-28 DIAGNOSIS — I700 Atherosclerosis of aorta: Secondary | ICD-10-CM | POA: Diagnosis not present

## 2023-03-28 DIAGNOSIS — E669 Obesity, unspecified: Secondary | ICD-10-CM

## 2023-03-28 DIAGNOSIS — E1169 Type 2 diabetes mellitus with other specified complication: Secondary | ICD-10-CM | POA: Diagnosis not present

## 2023-03-28 DIAGNOSIS — Z8673 Personal history of transient ischemic attack (TIA), and cerebral infarction without residual deficits: Secondary | ICD-10-CM

## 2023-03-28 DIAGNOSIS — E1159 Type 2 diabetes mellitus with other circulatory complications: Secondary | ICD-10-CM | POA: Diagnosis not present

## 2023-03-28 DIAGNOSIS — G4733 Obstructive sleep apnea (adult) (pediatric): Secondary | ICD-10-CM

## 2023-03-28 DIAGNOSIS — N400 Enlarged prostate without lower urinary tract symptoms: Secondary | ICD-10-CM

## 2023-03-28 DIAGNOSIS — Z23 Encounter for immunization: Secondary | ICD-10-CM

## 2023-03-28 DIAGNOSIS — I152 Hypertension secondary to endocrine disorders: Secondary | ICD-10-CM

## 2023-03-28 DIAGNOSIS — Z0000 Encounter for general adult medical examination without abnormal findings: Secondary | ICD-10-CM

## 2023-03-28 MED ORDER — ALLOPURINOL 100 MG PO TABS: 300.0000 mg | ORAL_TABLET | Freq: Every day | ORAL | 4 refills | Status: DC

## 2023-03-28 MED ORDER — CHLORTHALIDONE 25 MG PO TABS: 25.0000 mg | ORAL_TABLET | Freq: Every day | ORAL | 4 refills | Status: DC

## 2023-03-28 MED ORDER — AMLODIPINE BESYLATE 10 MG PO TABS: 10.0000 mg | ORAL_TABLET | Freq: Every day | ORAL | 4 refills | Status: DC

## 2023-03-28 MED ORDER — ROSUVASTATIN CALCIUM 40 MG PO TABS: 40.0000 mg | ORAL_TABLET | ORAL | 4 refills | Status: DC

## 2023-03-28 MED ORDER — TELMISARTAN 80 MG PO TABS: 80.0000 mg | ORAL_TABLET | Freq: Every day | ORAL | 4 refills | Status: DC

## 2023-03-28 MED ORDER — EZETIMIBE 10 MG PO TABS: 10.0000 mg | ORAL_TABLET | Freq: Every day | ORAL | 4 refills | Status: DC

## 2023-03-28 MED ORDER — METFORMIN HCL 500 MG PO TABS: 1000.0000 mg | ORAL_TABLET | Freq: Two times a day (BID) | ORAL | 4 refills | Status: DC

## 2023-03-28 NOTE — Assessment & Plan Note (Signed)
Diagnosed initially April 2024 and wanted to maintain diet focus.  A1c in July was 7.6% and started Metformin -- he is tolerating 1000 MG BID.  Urine ALB 03 January 2023. Discussed options to include Metformin, GLP1, SGLT2, DPP4.  Will continue Metformin.  Educated him on side effects and how medication works.  Offered referral to diabetes education, he declined at this time.  Recheck A1c today. - Continue Metformin 1000 MG BID.  Recent kidney function stable, eGFR 81. - Check sugars TID  - Continue ARB and statin therapy - Foot exam up to date.  Eye exam will obtain in new year when due and tell provider he is diabetic now.

## 2023-03-28 NOTE — Progress Notes (Signed)
BP 121/74   Pulse (!) 54   Temp 97.8 F (36.6 C) (Oral)   Ht 5\' 11"  (1.803 m)   Wt (!) 315 lb 6.4 oz (143.1 kg)   SpO2 98%   BMI 43.99 kg/m    Subjective:    Patient ID: Paul Cannon, male    DOB: 1960-11-19, 62 y.o.   MRN: [id number]  HPI: Paul Cannon is a 62 y.o. male presenting on 03/28/2023 for comprehensive medical examination. Current medical complaints include:none  He currently lives with: wife Interim Problems from his last visit: no  DIABETES A1c 7.6% in July.  Currently taking Metformin 1000 MG BID, has occasional nausea with this but not a problem.  Has been working on diet and exercise with 5 pounds lost since July.  Is cutting back on sweets.  Hypoglycemic episodes:no Polydipsia/polyuria: no Visual disturbance: no Chest pain: no Paresthesias: no Glucose Monitoring: yes             Accucheck frequency: Daily             Fasting glucose:             Post prandial: 108 to 158             Evening: 109 to 172             Before meals: 103 to 199 Taking Insulin?: no             Long acting insulin:             Short acting insulin: Blood Pressure Monitoring: rarely Retinal Examination: Not up to Date -- Dr. Katherina Mires in [place] Foot Exam: Up to Date Diabetic Education: Not Completed Pneumovax: Not up to Date Influenza: Up to Date Aspirin: no    HYPERTENSION / HYPERLIPIDEMIA Taking Chlorthalidone, Micardis, Amlodipine, Zetia, and Crestor taking  every other day -- has been trying every other day. Has underlying sleep apnea, he continues to use CPAP nightly -- he would like a referral to someone other then [facility].  Continues on Allopurinol for gout, had a mild flare one month ago -- took Colchicine which cleared it.   History: Intraparenchymal hemorrhage diagnosed 11/03/20.  MRI on 03/05/21 -- noted continued evolution and regression of right frontal parenchymal hemorrhage. Neuro last seen 11/28/20 and oncology 03/12/21 -- he was cleared from  oncology. Cardiology last seen 10/17/2019 to return as needed only.  History of CVA, lacunar infarcts.  Had past MYO screen, normal with EF 55-65%.  Satisfied with current treatment? yes Duration of hypertension: chronic BP monitoring frequency: rarely BP range:  BP medication side effects: no Duration of hyperlipidemia: chronic Cholesterol medication side effects: no Cholesterol supplements: none Medication compliance: good compliance Aspirin: yes Recent stressors: no Recurrent headaches: no Visual changes: no Palpitations: no Dyspnea: no Chest pain: no Lower extremity edema: no Dizzy/lightheaded: no  The ASCVD Risk score (Arnett DK, et al., 2019) failed to calculate for the following reasons:   The patient has a prior MI or stroke diagnosis  Functional Status Survey: Is the patient deaf or have difficulty hearing?: No Does the patient have difficulty seeing, even when wearing glasses/contacts?: No Does the patient have difficulty concentrating, remembering, or making decisions?: No Does the patient have difficulty walking or climbing stairs?: No Does the patient have difficulty dressing or bathing?: No Does the patient have difficulty doing errands alone such as visiting a doctor's office or shopping?: No  FALL RISK:    03/28/2023  10:04 AM 09/26/2022   10:30 AM 03/25/2022   10:14 AM 09/23/2021   10:36 AM 01/20/2021    8:08 AM  Fall Risk   Falls in the past year? 0 0 0 0 0  Number falls in past yr: 0 0 0 0 0  Injury with Fall? 0 0 0 0 0  Risk for fall due to : No Fall Risks No Fall Risks No Fall Risks No Fall Risks No Fall Risks  Follow up Falls evaluation completed Falls evaluation completed Falls evaluation completed Falls evaluation completed Falls evaluation completed   Depression Screen    03/28/2023   10:04 AM 09/26/2022   10:31 AM 03/25/2022   10:14 AM 09/23/2021   10:36 AM 01/20/2021    8:20 AM  Depression screen PHQ 2/9  Decreased Interest 0 0 0 0 2  Down,  Depressed, Hopeless 0 0 0 0 0  PHQ - 2 Score 0 0 0 0 2  Altered sleeping 0 0 0 0 1  Tired, decreased energy 0 0 0 0 1  Change in appetite 0 0 0 0 0  Feeling bad or failure about yourself  0 0 0 0 0  Trouble concentrating 0 0 0 0 0  Moving slowly or fidgety/restless 0 0 0 0 0  Suicidal thoughts 0 0 0 0 0  PHQ-9 Score 0 0 0 0 4  Difficult doing work/chores Not difficult at all Not difficult at all Not difficult at all Not difficult at all Not difficult at all      03/28/2023   10:05 AM 09/26/2022   10:31 AM 03/25/2022   10:15 AM 09/23/2021   10:36 AM  GAD 7 : Generalized Anxiety Score  Nervous, Anxious, on Edge 0 0 0 0  Control/stop worrying 0 0 0 0  Worry too much - different things 0 0 0 0  Trouble relaxing 0 0 0 0  Restless 0 0 0 0  Easily annoyed or irritable 0 0 0 0  Afraid - awful might happen 0 0 0 0  Total GAD 7 Score 0 0 0 0  Anxiety Difficulty Not difficult at all Not difficult at all Not difficult at all Not difficult at all   Past Medical History:  Past Medical History:  Diagnosis Date   Brain mass    Gout 2022   Hyperlipidemia    Hypertension    Obesity    Sleep apnea     Surgical History:  Past Surgical History:  Procedure Laterality Date   COLONOSCOPY WITH PROPOFOL N/A 11/17/2015   Procedure: COLONOSCOPY WITH PROPOFOL;  Surgeon: Midge Minium, MD;  Location: [facility] ENDOSCOPY;  Service: Endoscopy;  Laterality: N/A;   DENTAL SURGERY  05/2021   SHOULDER SURGERY     Medications:  Current Outpatient Medications on File Prior to Visit  Medication Sig   Blood Glucose Monitoring Suppl (ONETOUCH VERIO) w/Device KIT Use to check blood sugar 3 times a day and document results, bring to appointments.  Goal is <130 fasting blood sugar and <180 two hours after meals.   colchicine 0.6 MG tablet TAKE 1 TABLET (0.6 MG TOTAL) BY MOUTH DAILY. USE ONLY WHEN ACUTE FLARE OF GOUT PRESENT.   cyclobenzaprine (FLEXERIL) 10 MG tablet TAKE 1 TABLET BY MOUTH EVERY DAY AT BEDTIME AS  NEEDED   gabapentin (NEURONTIN) 300 MG capsule TAKE 1 CAPSULE BY MOUTH EVERY DAY   glucose blood (ONETOUCH VERIO) test strip Use as instructed   meloxicam (MOBIC) 7.5 MG tablet TAKE 1  TABLET BY MOUTH EVERY DAY   Multiple Vitamins-Minerals (ONE-A-DAY MENS 50+ ADVANTAGE PO) Take 1 tablet by mouth daily.   sildenafil (VIAGRA) 100 MG tablet Take 0.5-1 tablets (50-100 mg total) by mouth daily as needed for erectile dysfunction.   No current facility-administered medications on file prior to visit.    Allergies:  Allergies  Allergen Reactions   Percocet [Oxycodone-Acetaminophen] Diarrhea and Nausea And Vomiting   Vicodin [Hydrocodone-Acetaminophen] Diarrhea and Nausea And Vomiting    Social History:  Social History   Socioeconomic History   Marital status: Married    Spouse name: Not on file   Number of children: Not on file   Years of education: Not on file   Highest education level: Some college, no degree  Occupational History   Not on file  Tobacco Use   Smoking status: Former    Current packs/day: 0.00    Types: Cigarettes    Quit date: 10/01/2010    Years since quitting: 12.4   Smokeless tobacco: Former  Building services engineer status: Never Used  Substance and Sexual Activity   Alcohol use: Yes    Comment: pt states occasional or very rarely   Drug use: No   Sexual activity: Yes  Other Topics Concern   Not on file  Social History Narrative   Not on file   Social Determinants of Health   Financial Resource Strain: Low Risk  (09/25/2022)   Overall Financial Resource Strain (CARDIA)    Difficulty of Paying Living Expenses: Not hard at all  Food Insecurity: No Food Insecurity (09/25/2022)   Hunger Vital Sign    Worried About Running Out of Food in the Last Year: Never true    Ran Out of Food in the Last Year: Never true  Transportation Needs: No Transportation Needs (09/25/2022)   PRAPARE - Administrator, Civil Service (Medical): No    Lack of  Transportation (Non-Medical): No  Physical Activity: Unknown (09/25/2022)   Exercise Vital Sign    Days of Exercise per Week: 1 day    Minutes of Exercise per Session: Patient declined  Stress: No Stress Concern Present (09/25/2022)   [employer] of Occupational Health - Occupational Stress Questionnaire    Feeling of Stress : Not at all  Social Connections: Unknown (09/25/2022)   Social Connection and Isolation Panel [NHANES]    Frequency of Communication with Friends and Family: Three times a week    Frequency of Social Gatherings with Friends and Family: Once a week    Attends Religious Services: Patient declined    Database administrator or Organizations: No    Attends Engineer, structural: Not on file    Marital Status: Married  Catering manager Violence: Not on file   Social History   Tobacco Use  Smoking Status Former   Current packs/day: 0.00   Types: Cigarettes   Quit date: 10/01/2010   Years since quitting: 12.4  Smokeless Tobacco Former   Social History   Substance and Sexual Activity  Alcohol Use Yes   Comment: pt states occasional or very rarely    Family History:  Family History  Problem Relation Age of Onset   Arthritis Mother    Hyperlipidemia Mother    Hypertension Mother    Migraines Mother    Heart disease Father        MI   Stroke Father    Lung disease Father    Cancer Sister  breast   Cancer Brother        melanoma   Stroke Sister     Past medical history, surgical history, medications, allergies, family history and social history reviewed with patient today and changes made to appropriate areas of the chart.   ROS All other ROS negative except what is listed above and in the HPI.      Objective:    BP 121/74   Pulse (!) 54   Temp 97.8 F (36.6 C) (Oral)   Ht 5\' 11"  (1.803 m)   Wt (!) 315 lb 6.4 oz (143.1 kg)   SpO2 98%   BMI 43.99 kg/m   Wt Readings from Last 3 Encounters:  03/28/23 (!) 315 lb 6.4 oz (143.1  kg)  01/24/23 (!) 316 lb (143.3 kg)  12/26/22 (!) 320 lb 12.8 oz (145.5 kg)     Physical Exam Vitals and nursing note reviewed.  Constitutional:      General: He is awake. He is not in acute distress.    Appearance: He is well-developed and well-groomed. He is obese. He is not ill-appearing or toxic-appearing.  HENT:     Head: Normocephalic and atraumatic.     Right Ear: Hearing, tympanic membrane, ear canal and external ear normal. No drainage.     Left Ear: Hearing, tympanic membrane, ear canal and external ear normal. No drainage.     Nose: Nose normal.     Mouth/Throat:     Pharynx: Uvula midline.  Eyes:     General: Lids are normal.        Right eye: No discharge.        Left eye: No discharge.     Extraocular Movements: Extraocular movements intact.     Conjunctiva/sclera: Conjunctivae normal.     Pupils: Pupils are equal, round, and reactive to light.     Visual Fields: Right eye visual fields normal and left eye visual fields normal.  Neck:     Thyroid: No thyromegaly.     Vascular: No carotid bruit or JVD.     Trachea: Trachea normal.  Cardiovascular:     Rate and Rhythm: Regular rhythm. Bradycardia present.     Heart sounds: Normal heart sounds, S1 normal and S2 normal. No murmur heard.    No gallop.  Pulmonary:     Effort: Pulmonary effort is normal. No accessory muscle usage or respiratory distress.     Breath sounds: Normal breath sounds.  Abdominal:     General: Bowel sounds are normal.     Palpations: Abdomen is soft. There is no hepatomegaly or splenomegaly.     Tenderness: There is no abdominal tenderness.  Musculoskeletal:        General: Normal range of motion.     Cervical back: Normal range of motion and neck supple.     Right lower leg: No edema.     Left lower leg: No edema.  Lymphadenopathy:     Head:     Right side of head: No submental, submandibular, tonsillar, preauricular or posterior auricular adenopathy.     Left side of head: No  submental, submandibular, tonsillar, preauricular or posterior auricular adenopathy.     Cervical: No cervical adenopathy.  Skin:    General: Skin is warm and dry.     Capillary Refill: Capillary refill takes less than 2 seconds.     Findings: No rash.  Neurological:     Mental Status: He is alert and oriented to person, place, and time.  Gait: Gait is intact.     Deep Tendon Reflexes: Reflexes are normal and symmetric.     Reflex Scores:      Brachioradialis reflexes are 2+ on the right side and 2+ on the left side.      Patellar reflexes are 2+ on the right side and 2+ on the left side. Psychiatric:        Attention and Perception: Attention normal.        Mood and Affect: Mood normal.        Speech: Speech normal.        Behavior: Behavior normal. Behavior is cooperative.        Thought Content: Thought content normal.        Cognition and Memory: Cognition normal.     Results for orders placed or performed in visit on 12/26/22  Bayer DCA Hb A1c Waived  Result Value Ref Range   HB A1C (BAYER DCA - WAIVED) 7.6 (H) 4.8 - 5.6 %  Microalbumin, Urine Waived  Result Value Ref Range   Microalb, Ur Waived 30 (H) 0 - 19 mg/L   Creatinine, Urine Waived 200 10 - 300 mg/dL   Microalb/Creat Ratio <30 <30 mg/g  Comprehensive metabolic panel  Result Value Ref Range   Glucose 148 (H) 70 - 99 mg/dL   BUN 20 8 - 27 mg/dL   Creatinine, Ser 8.84 0.76 - 1.27 mg/dL   eGFR 67 >16 SA/YTK/1.60   BUN/Creatinine Ratio 16 10 - 24   Sodium 141 134 - 144 mmol/L   Potassium 3.7 3.5 - 5.2 mmol/L   Chloride 102 96 - 106 mmol/L   CO2 23 20 - 29 mmol/L   Calcium 9.3 8.6 - 10.2 mg/dL   Total Protein 6.9 6.0 - 8.5 g/dL   Albumin 4.4 3.9 - 4.9 g/dL   Globulin, Total 2.5 1.5 - 4.5 g/dL   Bilirubin Total 0.6 0.0 - 1.2 mg/dL   Alkaline Phosphatase 80 44 - 121 IU/L   AST 69 (H) 0 - 40 IU/L   ALT 68 (H) 0 - 44 IU/L  Lipid Panel w/o Chol/HDL Ratio  Result Value Ref Range   Cholesterol, Total 116 100 -  199 mg/dL   Triglycerides 109 (H) 0 - 149 mg/dL   HDL 39 (L) >32 mg/dL   VLDL Cholesterol Cal 27 5 - 40 mg/dL   LDL Chol Calc (NIH) 50 0 - 99 mg/dL      Assessment & Plan:   Problem List Items Addressed This Visit       Cardiovascular and Mediastinum   Aortic atherosclerosis (HCC)    Chronic.  Noted on CXR 12/25/20.  Recommend continuation of statin daily, avoid ASA at this time due to brain hemorrhage in past.      Relevant Medications   amLODipine (NORVASC) 10 MG tablet   chlorthalidone (HYGROTON) 25 MG tablet   ezetimibe (ZETIA) 10 MG tablet   telmisartan (MICARDIS) 80 MG tablet   rosuvastatin (CRESTOR) 40 MG tablet   Other Relevant Orders   Comprehensive metabolic panel   Lipid Panel w/o Chol/HDL Ratio   Hypertension associated with diabetes (HCC)    Chronic, stable . BP at goal today. Continue current medication regimen and recommend DASH diet at home, lowering sodium in diet to avoid fluid retention. Adjust regimen as needed.  Continue to collaborate with cardiology as needed.  Labs: CMP, TSH.  Will further consider discontinuation of Chlorthalidone if ongoing gout flares and possibly need to start Hydralazine  or Clonidine -- avoid BB due to low resting HR.  Urine ALB 03 January 2023.      Relevant Medications   amLODipine (NORVASC) 10 MG tablet   chlorthalidone (HYGROTON) 25 MG tablet   ezetimibe (ZETIA) 10 MG tablet   metFORMIN (GLUCOPHAGE) 500 MG tablet   telmisartan (MICARDIS) 80 MG tablet   rosuvastatin (CRESTOR) 40 MG tablet   Other Relevant Orders   CBC with Differential/Platelet   Comprehensive metabolic panel   TSH   HgB A1c     Respiratory   OSA on CPAP    Chronic, stable -- continue 100% use of CPAP at home.          Endocrine   Hyperlipidemia associated with type 2 diabetes mellitus (HCC)    Chronic, ongoing.  Continue current medication regimen and adjust as needed -- is on three days weekly Crestor due to myalgia with increased doses + continue Zetia  daily  Will plan on repeat CMP and lipids. 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  A1c in July was 7.6% and started Metformin -- he is tolerating 1000 MG BID.  Urine ALB 03 January 2023. Discussed options to include Metformin, GLP1, SGLT2, DPP4.  Will continue Metformin.  Educated him on side effects and how medication works.  Offered referral to diabetes education, he declined at this time.  Recheck A1c today. - Continue Metformin 1000 MG BID.  Recent kidney function stable, eGFR 81. - Check sugars TID  - Continue ARB and statin therapy - Foot exam up to date.  Eye exam will obtain in new year when due and tell provider he is diabetic now.      Relevant Medications   metFORMIN (GLUCOPHAGE) 500 MG tablet   telmisartan (MICARDIS) 80 MG tablet   rosuvastatin (CRESTOR) 40 MG tablet   Other Relevant Orders   HgB A1c     Musculoskeletal and Integument   Chronic idiopathic gout involving toe of left foot without tophus    Chronic, ongoing.  Check uric acid level today and continue current medication regimen.  Colchicine for use during acute flares and Gabapentin for his chronic knee pain as needed. Consider discontinuation of Chlorthalidone if ongoing flares.        Relevant Medications   allopurinol (ZYLOPRIM) 100 MG tablet   Other Relevant Orders   Uric acid     Other   History of stroke    In August 2020 and intraparenchymal bleed June 2022. Continue statin daily and avoid ASA due to  past bleed.  BP at goal today.  Check A1c, CMP, CBC. Educated him on stroke prevention goals: BP <130/80 LDL <55 A1C <6.5       Morbid obesity (HCC)    BMI 43.99 with HTN/HLD, T2DM.  Has lost 9 pounds. Recommended eating smaller high protein, low fat meals more frequently and exercising 30 mins a day 5 times a week with a  goal of 10-15lb weight loss in the next 3 months. Patient voiced their understanding and motivation to adhere to these recommendations.       Relevant Medications   metFORMIN (GLUCOPHAGE) 500 MG tablet   Other Visit Diagnoses     Benign prostatic hyperplasia without lower urinary tract symptoms       PSA on labs today.   Relevant Orders   PSA   Flu vaccine need       Flu vaccine today, educated patient.   Relevant Orders   Flu vaccine trivalent PF, 6mos and older(Flulaval,Afluria,Fluarix,Fluzone) (Completed)   Need for Tdap vaccination       Tdap today, educated patient.   Relevant Orders   Tdap vaccine greater than or equal to 7yo IM (Completed)   Encounter for annual physical exam       Annual physical today with labs and health maintenance reviewed, discussed with patient.       Discussed aspirin prophylaxis for myocardial infarction prevention and decision was it was not indicated  LABORATORY TESTING:  Health maintenance labs ordered today as discussed above.   The natural history of prostate cancer and ongoing controversy regarding screening and potential treatment outcomes of prostate cancer has been discussed with the patient. The meaning of a false positive PSA and a false negative PSA has been discussed. He indicates understanding of the limitations of this screening test and wishes to proceed with screening PSA testing.   IMMUNIZATIONS:   - Tdap: Tetanus vaccination status reviewed: Td vaccination indicated and given today. - Influenza: Administered today - Pneumovax: Not applicable - Prevnar: Not applicable - Zostavax vaccine:  Refused  SCREENING: - Colonoscopy: Up to date  Discussed with patient purpose of the colonoscopy is to detect colon cancer at curable precancerous or early stages   - AAA Screening: Not applicable  -Hearing Test: Not applicable  -Spirometry: Not applicable   PATIENT COUNSELING:    Sexuality: Discussed sexually transmitted diseases, partner selection, use of condoms, avoidance of unintended pregnancy  and contraceptive alternatives.   Advised to avoid cigarette smoking.  I discussed with the patient that most people either abstain from alcohol or drink within safe limits (<=14/week and <=4 drinks/occasion for males, <=7/weeks and <= 3 drinks/occasion for females) and that the risk for alcohol disorders and other health effects rises proportionally with the number of drinks per week and how often a drinker exceeds daily limits.  Discussed cessation/primary prevention of drug use and availability of treatment for abuse.   Diet: Encouraged to adjust caloric intake to maintain  or achieve ideal body weight, to reduce intake of dietary saturated fat and total fat, to limit sodium intake by avoiding high sodium foods and not adding table salt, and to maintain adequate dietary potassium and calcium preferably from fresh fruits, vegetables, and low-fat dairy products.    Stressed the importance of regular exercise  Injury prevention: Discussed safety belts, safety helmets, smoke detector, smoking near bedding or upholstery.   Dental health: Discussed importance of regular tooth brushing, flossing, and dental visits.   Follow up plan: NEXT PREVENTATIVE PHYSICAL DUE IN 1 YEAR. Return in about 3 months (around 06/28/2023) for T2DM, HTN/HLD.

## 2023-03-28 NOTE — Assessment & Plan Note (Signed)
Chronic, ongoing.  Check uric acid level today and continue current medication regimen.  Colchicine for use during acute flares and Gabapentin for his chronic knee pain as needed. Consider discontinuation of Chlorthalidone if ongoing flares.

## 2023-03-28 NOTE — Assessment & Plan Note (Signed)
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 Continue statin daily and avoid ASA due to past bleed.  BP at goal today.  Check A1c, CMP, CBC. Educated him on stroke prevention goals: BP <130/80 LDL <55 A1C <6.5

## 2023-03-28 NOTE — Assessment & Plan Note (Signed)
Chronic, stable -- continue 100% use of CPAP at home.

## 2023-03-28 NOTE — Assessment & Plan Note (Signed)
Chronic, ongoing.  Continue current medication regimen and adjust as needed -- is on three days weekly Crestor due to myalgia with increased doses + continue Zetia daily  Will plan on repeat CMP and lipids. Educated him on stroke prevention goals: BP <130/80 LDL <55 A1C <6.5

## 2023-03-28 NOTE — Assessment & Plan Note (Addendum)
Chronic.  Noted on CXR 12/25/20.  Recommend continuation of statin daily, avoid ASA at this time due to brain hemorrhage in past.

## 2023-03-28 NOTE — Assessment & Plan Note (Signed)
BMI 43.99 with HTN/HLD, T2DM.  Has lost 9 pounds. Recommended eating smaller high protein, low fat meals more frequently and exercising 30 mins a day 5 times a week with a goal of 10-15lb weight loss in the next 3 months. Patient voiced their understanding and motivation to adhere to these recommendations.

## 2023-03-28 NOTE — Assessment & Plan Note (Signed)
Chronic, stable . BP at goal today. Continue current medication regimen and recommend DASH diet at home, lowering sodium in diet to avoid fluid retention. Adjust regimen as needed.  Continue to collaborate with cardiology as needed.  Labs: CMP, TSH.  Will further consider discontinuation of Chlorthalidone if ongoing gout flares and possibly need to start Hydralazine or Clonidine -- avoid BB due to low resting HR.  Urine ALB 03 January 2023.

## 2023-03-28 NOTE — Addendum Note (Signed)
Addended by: Aura Dials T on: 03/28/2023 12:32 PM   Modules accepted: Level of Service

## 2023-03-29 ENCOUNTER — Other Ambulatory Visit: Payer: Self-pay | Admitting: Nurse Practitioner

## 2023-03-29 LAB — COMPREHENSIVE METABOLIC PANEL
ALT: 59 [IU]/L — ABNORMAL HIGH (ref 0–44)
AST: 58 [IU]/L — ABNORMAL HIGH (ref 0–40)
Albumin: 4.5 g/dL (ref 3.9–4.9)
Alkaline Phosphatase: 75 [IU]/L (ref 44–121)
BUN/Creatinine Ratio: 14 (ref 10–24)
BUN: 15 mg/dL (ref 8–27)
Bilirubin Total: 0.6 mg/dL (ref 0.0–1.2)
CO2: 22 mmol/L (ref 20–29)
Calcium: 9.7 mg/dL (ref 8.6–10.2)
Chloride: 101 mmol/L (ref 96–106)
Creatinine, Ser: 1.04 mg/dL (ref 0.76–1.27)
Globulin, Total: 2.9 g/dL (ref 1.5–4.5)
Glucose: 121 mg/dL — ABNORMAL HIGH (ref 70–99)
Potassium: 3.9 mmol/L (ref 3.5–5.2)
Sodium: 140 mmol/L (ref 134–144)
Total Protein: 7.4 g/dL (ref 6.0–8.5)
eGFR: 81 mL/min/{1.73_m2} (ref >59)

## 2023-03-29 LAB — PSA: Prostate Specific Ag, Serum: 1.2 ng/mL (ref 0.0–4.0)

## 2023-03-29 LAB — CBC WITH DIFFERENTIAL/PLATELET
Basophils Absolute: 0 10*3/uL (ref 0.0–0.2)
Basos: 0 %
EOS (ABSOLUTE): 0.2 10*3/uL (ref 0.0–0.4)
Eos: 3 %
Hematocrit: 43.2 % (ref 37.5–51.0)
Hemoglobin: 14.3 g/dL (ref 13.0–17.7)
Immature Grans (Abs): 0 10*3/uL (ref 0.0–0.1)
Immature Granulocytes: 0 %
Lymphocytes Absolute: 1.3 10*3/uL (ref 0.7–3.1)
Lymphs: 17 %
MCH: 30.1 pg (ref 26.6–33.0)
MCHC: 33.1 g/dL (ref 31.5–35.7)
MCV: 91 fL (ref 79–97)
Monocytes Absolute: 0.5 10*3/uL (ref 0.1–0.9)
Monocytes: 7 %
Neutrophils Absolute: 5.4 10*3/uL (ref 1.4–7.0)
Neutrophils: 73 %
Platelets: 201 10*3/uL (ref 150–450)
RBC: 4.75 x10E6/uL (ref 4.14–5.80)
RDW: 13.8 % (ref 11.6–15.4)
WBC: 7.5 10*3/uL (ref 3.4–10.8)

## 2023-03-29 LAB — LIPID PANEL W/O CHOL/HDL RATIO
Cholesterol, Total: 118 mg/dL (ref 100–199)
HDL: 36 mg/dL — ABNORMAL LOW (ref >39)
LDL Chol Calc (NIH): 50 mg/dL (ref 0–99)
Triglycerides: 194 mg/dL — ABNORMAL HIGH (ref 0–149)
VLDL Cholesterol Cal: 32 mg/dL (ref 5–40)

## 2023-03-29 LAB — HEMOGLOBIN A1C
Est. average glucose Bld gHb Est-mCnc: 140 mg/dL
Hgb A1c MFr Bld: 6.5 % — ABNORMAL HIGH (ref 4.8–5.6)

## 2023-03-29 LAB — TSH: TSH: 3.74 u[IU]/mL (ref 0.450–4.500)

## 2023-03-29 LAB — URIC ACID: Uric Acid: 8.3 mg/dL (ref 3.8–8.4)

## 2023-03-30 NOTE — Progress Notes (Signed)
Contacted via MyChart   Good morning Thayer Ohm, your labs have returned: - A1c is improving with medication -- 7.6% to now 6.5%!!  [place] job.  Continue medication. - Kidney function, creatinine and eGFR, remains normal. Liver function continues to have mild elevations, ALT and AST.  This has been ongoing for one year.  You may benefit some imaging of liver to check on it. - LDL is in good range. - Remainder of labs are normal.  Any questions? Keep being amazing!!  Thank you for allowing me to participate in your care.  I appreciate you. Kindest regards, Krysti Hickling

## 2023-03-31 NOTE — Telephone Encounter (Signed)
Request is too soon for refill, last refill 02/14/23 for 90 days.  Requested Prescriptions  Pending Prescriptions Disp Refills   meloxicam (MOBIC) 7.5 MG tablet [Pharmacy Med Name: MELOXICAM 7.5 MG TABLET] 90 tablet 0    Sig: TAKE 1 TABLET BY MOUTH EVERY DAY     Analgesics:  COX2 Inhibitors Failed - 03/29/2023  4:50 PM      Failed - Manual Review: Labs are only required if the patient has taken medication for more than 8 weeks.      Failed - AST in normal range and within 360 days    AST  Date Value Ref Range Status  03/28/2023 58 (H) 0 - 40 IU/L Final         Failed - ALT in normal range and within 360 days    ALT  Date Value Ref Range Status  03/28/2023 59 (H) 0 - 44 IU/L Final         Passed - HGB in normal range and within 360 days    Hemoglobin  Date Value Ref Range Status  03/28/2023 14.3 13.0 - 17.7 g/dL Final         Passed - Cr in normal range and within 360 days    Creatinine, Ser  Date Value Ref Range Status  03/28/2023 1.04 0.76 - 1.27 mg/dL Final         Passed - HCT in normal range and within 360 days    Hematocrit  Date Value Ref Range Status  03/28/2023 43.2 37.5 - 51.0 % Final         Passed - eGFR is 30 or above and within 360 days    GFR calc Af Amer  Date Value Ref Range Status  07/21/2020 85 >59 mL/min/1.73 Final    Comment:    **In accordance with recommendations from the NKF-ASN Task force,**   [facility] is in the process of updating its eGFR calculation to the   2021 CKD-EPI creatinine equation that estimates kidney function   without a race variable.    GFR calc non Af Amer  Date Value Ref Range Status  07/21/2020 74 >59 mL/min/1.73 Final   eGFR  Date Value Ref Range Status  03/28/2023 81 >59 mL/min/1.73 Final         Passed - Patient is not pregnant      Passed - Valid encounter within last 12 months    Recent Outpatient Visits           3 days ago Type 2 diabetes mellitus with obesity (HCC)   [facility] [place], [place] T, NP   2 months ago Type 2 diabetes mellitus with obesity (HCC)   [place] [facility] [place], [place] T, NP   3 months ago Type 2 diabetes mellitus with obesity (HCC)   [place] [facility] [place], Corrie Dandy T, NP   6 months ago Morbid obesity (HCC)   [facility] [place], Corrie Dandy T, NP   1 year ago Morbid obesity (HCC)   [place] [facility] [place], Dorie Rank, NP       Future Appointments             In 2 months Cannady, Dorie Rank, NP [place] [facility], [facility]

## 2023-04-18 ENCOUNTER — Other Ambulatory Visit: Payer: Self-pay | Admitting: Nurse Practitioner

## 2023-04-19 NOTE — Telephone Encounter (Signed)
Request is too soon for refill, last refill 03/28/23.  Requested Prescriptions  Pending Prescriptions Disp Refills   rosuvastatin (CRESTOR) 40 MG tablet [Pharmacy Med Name: ROSUVASTATIN CALCIUM 40 MG TAB] 24 tablet 4    Sig: TAKE 1 TABLET (40 MG TOTAL) BY MOUTH 2 (TWO) TIMES A WEEK.     Cardiovascular:  Antilipid - Statins 2 Failed - 04/18/2023  1:28 AM      Failed - Lipid Panel in normal range within the last 12 months    Cholesterol, Total  Date Value Ref Range Status  03/28/2023 118 100 - 199 mg/dL Final   Cholesterol Piccolo, Waived  Date Value Ref Range Status  07/08/2019 100 <200 mg/dL Final    Comment:                            Desirable                <200                         Borderline High      200- 239                         High                     >239    LDL Chol Calc (NIH)  Date Value Ref Range Status  03/28/2023 50 0 - 99 mg/dL Final   HDL  Date Value Ref Range Status  03/28/2023 36 (L) >39 mg/dL Final   Triglycerides  Date Value Ref Range Status  03/28/2023 194 (H) 0 - 149 mg/dL Final   Triglycerides Piccolo,Waived  Date Value Ref Range Status  07/08/2019 100 <150 mg/dL Final    Comment:                            Normal                   <150                         Borderline High     150 - 199                         High                200 - 499                         Very High                >499          Passed - Cr in normal range and within 360 days    Creatinine, Ser  Date Value Ref Range Status  03/28/2023 1.04 0.76 - 1.27 mg/dL Final         Passed - Patient is not pregnant      Passed - Valid encounter within last 12 months    Recent Outpatient Visits           3 weeks ago Type 2 diabetes mellitus with obesity (HCC)   [facility] [place], [place] T, NP   2 months ago Type 2 diabetes mellitus with  obesity (HCC)   [place] [facility] [place], [place] T, NP   3 months ago Type 2  diabetes mellitus with obesity (HCC)   [place] [facility] [place], Corrie Dandy T, NP   6 months ago Morbid obesity [facility])   [place] [facility] [place], Corrie Dandy T, NP   1 year ago Morbid obesity [facility])   [place] [facility] Marjie Skiff, NP       Future Appointments             In 2 months Cannady, Dorie Rank, NP [place] [facility], [facility]

## 2023-05-10 ENCOUNTER — Other Ambulatory Visit: Payer: Self-pay | Admitting: Nurse Practitioner

## 2023-05-11 NOTE — Telephone Encounter (Signed)
Requested medications are due for refill today.  yes  Requested medications are on the active medications list.  yes  Last refill. 03/13/2023 #30 1 rf  Future visit scheduled.   yes  Notes to clinic.  Refill not delegated.    Requested Prescriptions  Pending Prescriptions Disp Refills   cyclobenzaprine (FLEXERIL) 10 MG tablet [Pharmacy Med Name: CYCLOBENZAPRINE 10 MG TABLET] 30 tablet 1    Sig: TAKE 1 TABLET BY MOUTH EVERY DAY AT BEDTIME AS NEEDED     Not Delegated - Analgesics:  Muscle Relaxants Failed - 05/10/2023  4:06 AM      Failed - This refill cannot be delegated      Passed - Valid encounter within last 6 months    Recent Outpatient Visits           1 month ago Type 2 diabetes mellitus with obesity (HCC)   [place] [facility] [place], [place] T, NP   3 months ago Type 2 diabetes mellitus with obesity (HCC)   [place] [facility] [place], [place] T, NP   4 months ago Type 2 diabetes mellitus with obesity (HCC)   [place] [facility] [place], Corrie Dandy T, NP   7 months ago Morbid obesity (HCC)   [place] [facility] [place], Corrie Dandy T, NP   1 year ago Morbid obesity (HCC)   [place] [facility] [place], Dorie Rank, NP       Future Appointments             In 1 month Cannady, Dorie Rank, NP [place] [facility], [facility]

## 2023-05-28 ENCOUNTER — Other Ambulatory Visit: Payer: Self-pay | Admitting: Nurse Practitioner

## 2023-05-28 DIAGNOSIS — E1159 Type 2 diabetes mellitus with other circulatory complications: Secondary | ICD-10-CM

## 2023-05-30 NOTE — Telephone Encounter (Signed)
Requested Prescriptions  Pending Prescriptions Disp Refills   chlorthalidone (HYGROTON) 25 MG tablet [Pharmacy Med Name: CHLORTHALIDONE 25 MG TABLET] 90 tablet 0    Sig: TAKE 1 TABLET (25 MG TOTAL) BY MOUTH DAILY.     Cardiovascular: Diuretics - Thiazide Passed - 05/30/2023  9:10 AM      Passed - Cr in normal range and within 180 days    Creatinine, Ser  Date Value Ref Range Status  03/28/2023 1.04 0.76 - 1.27 mg/dL Final         Passed - K in normal range and within 180 days    Potassium  Date Value Ref Range Status  03/28/2023 3.9 3.5 - 5.2 mmol/L Final         Passed - Na in normal range and within 180 days    Sodium  Date Value Ref Range Status  03/28/2023 140 134 - 144 mmol/L Final         Passed - Last BP in normal range    BP Readings from Last 1 Encounters:  03/28/23 121/74         Passed - Valid encounter within last 6 months    Recent Outpatient Visits           2 months ago Type 2 diabetes mellitus with obesity (HCC)   [place] [facility] [place], [place] T, NP   4 months ago Type 2 diabetes mellitus with obesity (HCC)   [place] [facility] [place], [place] T, NP   5 months ago Type 2 diabetes mellitus with obesity (HCC)   [place] [facility] [place], Corrie Dandy T, NP   8 months ago Morbid obesity (HCC)   [place] [facility] [place], Corrie Dandy T, NP   1 year ago Morbid obesity (HCC)   [place] [facility] [place], Dorie Rank, NP       Future Appointments             In 4 weeks Cannady, Dorie Rank, NP [place] [facility], [facility]

## 2023-06-25 DIAGNOSIS — E119 Type 2 diabetes mellitus without complications: Secondary | ICD-10-CM | POA: Insufficient documentation

## 2023-06-25 NOTE — Patient Instructions (Signed)
Be Involved in Caring For Your Health:  Taking Medications When medications are taken as directed, they can greatly improve your health. But if they are not taken as prescribed, they may not work. In some cases, not taking them correctly can be harmful. To help ensure your treatment remains effective and safe, understand your medications and how to take them. Bring your medications to each visit for review by your provider.  Your lab results, notes, and after visit summary will be available on My Chart. We strongly encourage you to use this feature. If lab results are abnormal the clinic will contact you with the appropriate steps. If the clinic does not contact you assume the results are satisfactory. You can always view your results on My Chart. If you have questions regarding your health or results, please contact the clinic during office hours. You can also ask questions on My Chart.  We at [facility] are grateful that you chose [place] to provide your care. We strive to provide evidence-based and compassionate care and are always looking for feedback. If you get a survey from the clinic please complete this so we can hear your opinions.  Diabetes Mellitus and Foot Care Diabetes, also called diabetes mellitus, may cause problems with your feet and legs because of poor blood flow (circulation). Poor circulation may make your skin: Become thinner and drier. Break more easily. Heal more slowly. Peel and crack. You may also have nerve damage (neuropathy). This can cause decreased feeling in your legs and feet. This means that you may not notice minor injuries to your feet that could lead to more serious problems. Finding and treating problems early is the best way to prevent future foot problems. How to care for your feet Foot hygiene  Wash your feet daily with warm water and mild soap. Do not use hot water. Then, pat your feet and the areas between your toes until they are fully dry. Do  not soak your feet. This can dry your skin. Trim your toenails straight across. Do not dig under them or around the cuticle. File the edges of your nails with an emery board or nail file. Apply a moisturizing lotion or petroleum jelly to the skin on your feet and to dry, brittle toenails. Use lotion that does not contain alcohol and is unscented. Do not apply lotion between your toes. Shoes and socks Wear clean socks or stockings every day. Make sure they are not too tight. Do not wear knee-high stockings. These may decrease blood flow to your legs. Wear shoes that fit well and have enough cushioning. Always look in your shoes before you put them on to be sure there are no objects inside. To break in new shoes, wear them for just a few hours a day. This prevents injuries on your feet. Wounds, scrapes, corns, and calluses  Check your feet daily for blisters, cuts, bruises, sores, and redness. If you cannot see the bottom of your feet, use a mirror or ask someone for help. Do not cut off corns or calluses or try to remove them with medicine. If you find a minor scrape, cut, or break in the skin on your feet, keep it and the skin around it clean and dry. You may clean these areas with mild soap and water. Do not clean the area with peroxide, alcohol, or iodine. If you have a wound, scrape, corn, or callus on your foot, look at it several times a day to make sure it  is healing and not infected. Check for: Redness, swelling, or pain. Fluid or blood. Warmth. Pus or a bad smell. General tips Do not cross your legs. This may decrease blood flow to your feet. Do not use heating pads or hot water bottles on your feet. They may burn your skin. If you have lost feeling in your feet or legs, you may not know this is happening until it is too late. Protect your feet from hot and cold by wearing shoes, such as at the beach or on hot pavement. Schedule a complete foot exam at least once a year or more often if  you have foot problems. Report any cuts, sores, or bruises to your health care provider right away. Where to find more information American Diabetes Association: diabetes.org Association of Diabetes Care & Education Specialists: diabeteseducator.org Contact a health care provider if: You have a condition that increases your risk of infection, and you have any cuts, sores, or bruises on your feet. You have an injury that is not healing. You have redness on your legs or feet. You feel burning or tingling in your legs or feet. You have pain or cramps in your legs and feet. Your legs or feet are numb. Your feet always feel cold. You have pain around any toenails. Get help right away if: You have a wound, scrape, corn, or callus on your foot and: You have signs of infection. You have a fever. You have a red line going up your leg. This information is not intended to replace advice given to you by your health care provider. Make sure you discuss any questions you have with your health care provider. Document Revised: 11/24/2021 Document Reviewed: 11/24/2021 Elsevier Patient Education  2024 [employer].

## 2023-06-28 ENCOUNTER — Ambulatory Visit (INDEPENDENT_AMBULATORY_CARE_PROVIDER_SITE_OTHER): Payer: Commercial Managed Care - PPO | Admitting: Nurse Practitioner

## 2023-06-28 ENCOUNTER — Encounter: Payer: Self-pay | Admitting: Nurse Practitioner

## 2023-06-28 VITALS — BP 115/66 | HR 64 | Temp 98.1°F | Ht 71.0 in | Wt 317.2 lb

## 2023-06-28 DIAGNOSIS — E119 Type 2 diabetes mellitus without complications: Secondary | ICD-10-CM

## 2023-06-28 DIAGNOSIS — E1169 Type 2 diabetes mellitus with other specified complication: Secondary | ICD-10-CM

## 2023-06-28 DIAGNOSIS — Z8673 Personal history of transient ischemic attack (TIA), and cerebral infarction without residual deficits: Secondary | ICD-10-CM

## 2023-06-28 DIAGNOSIS — I700 Atherosclerosis of aorta: Secondary | ICD-10-CM | POA: Diagnosis not present

## 2023-06-28 DIAGNOSIS — E1159 Type 2 diabetes mellitus with other circulatory complications: Secondary | ICD-10-CM | POA: Diagnosis not present

## 2023-06-28 DIAGNOSIS — E669 Obesity, unspecified: Secondary | ICD-10-CM

## 2023-06-28 DIAGNOSIS — I152 Hypertension secondary to endocrine disorders: Secondary | ICD-10-CM

## 2023-06-28 DIAGNOSIS — Z1283 Encounter for screening for malignant neoplasm of skin: Secondary | ICD-10-CM

## 2023-06-28 DIAGNOSIS — Z7984 Long term (current) use of oral hypoglycemic drugs: Secondary | ICD-10-CM

## 2023-06-28 DIAGNOSIS — E785 Hyperlipidemia, unspecified: Secondary | ICD-10-CM

## 2023-06-28 DIAGNOSIS — G4733 Obstructive sleep apnea (adult) (pediatric): Secondary | ICD-10-CM

## 2023-06-28 LAB — MICROALBUMIN, URINE WAIVED
Creatinine, Urine Waived: 300 mg/dL (ref 10–300)
Microalb, Ur Waived: 80 mg/L — ABNORMAL HIGH (ref 0–19)

## 2023-06-28 LAB — BAYER DCA HB A1C WAIVED: HB A1C (BAYER DCA - WAIVED): 6.4 % — ABNORMAL HIGH (ref 4.8–5.6)

## 2023-06-28 MED ORDER — FAMOTIDINE 40 MG PO TABS: 40.0000 mg | ORAL_TABLET | Freq: Every day | ORAL | 12 refills | Status: DC | PRN

## 2023-06-28 NOTE — Assessment & Plan Note (Signed)
Chronic, stable -- continue 100% use of CPAP at home.

## 2023-06-28 NOTE — Assessment & Plan Note (Signed)
Chronic, stable . BP at goal today. Continue current medication regimen and recommend DASH diet at home, lowering sodium in diet to avoid fluid retention. Adjust regimen as needed.  Continue to collaborate with cardiology as needed.  Labs: CMP.  Will further consider discontinuation of Chlorthalidone if ongoing gout flares and possibly need to start Hydralazine or Clonidine -- avoid BB due to low resting HR.  Urine ALB 06 July 2023.

## 2023-06-28 NOTE — Assessment & Plan Note (Signed)
Diagnosed initially April 2024 and wanted to maintain diet focus.  A1c today 6.4%.  Urine ALB 06 July 2023. Discussed options to include continue Metformin, GLP1, SGLT2, DPP4.  Will continue Metformin.  Educated him on side effects and how medication works.  Offered referral to diabetes education, he declined at this time.   - Continue Metformin 1000 MG BID.  Recent kidney function stable. - Check sugars TID  - Continue ARB and statin therapy - Foot exam up to date.  Eye exam will obtain.

## 2023-06-28 NOTE — Assessment & Plan Note (Signed)
Refer to plan of care for obesity.

## 2023-06-28 NOTE — Assessment & Plan Note (Signed)
Chronic, ongoing.  Continue current medication regimen and adjust as needed -- is on every other day Crestor due to myalgia with increased doses + continue Zetia daily  Will plan on repeat CMP and lipids. Educated him on stroke prevention goals: BP <130/80 LDL <55 A1C <6.5

## 2023-06-28 NOTE — Assessment & Plan Note (Addendum)
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 Continue statin daily and avoid ASA due to past bleed.  BP at goal today.  Check A1c, CMP, lipid Educated him on stroke prevention goals: BP <130/80 LDL <55 A1C <6.5

## 2023-06-28 NOTE — Progress Notes (Signed)
BP 115/66   Pulse 64   Temp 98.1 F (36.7 C) (Oral)   Ht 5\' 11"  (1.803 m)   Wt (!) 317 lb 3.2 oz (143.9 kg)   SpO2 98%   BMI 44.24 kg/m    Subjective:    Patient ID: Paul Cannon, male    DOB: March 31, 1961, 63 y.o.   MRN: [id number]  HPI: Paul Cannon is a 63 y.o. male  Chief Complaint  Patient presents with   Diabetes    No recent eye exam per patient   Hyperlipidemia   Hypertension   Gastroesophageal Reflux    Patient states he has been having issues with acid reflux lately. States it is getting worse and happening more frequently. States he has been taking a lot of tums recently. States his wife gave him some Pepcid which did help some.    Wife at bedside to assist.  DIABETES A1c 7.6% in July.  Taking Metformin 1000 MG BID, has to eat food with this. He is trying to work on diet and wife is trying to get him to go to gym. Hypoglycemic episodes:no Polydipsia/polyuria: no Visual disturbance: no Chest pain: no Paresthesias: no Glucose Monitoring: yes             Accucheck frequency: tries to check daily, when the "stupid thing works"             Fasting glucose: 100 to 150 varying on time of day             Post prandial:             Evening:              Before meals:  Taking Insulin?: no             Long acting insulin:             Short acting insulin: Blood Pressure Monitoring: rarely Retinal Examination: Not up to Date -- Dr. Katherina Mires in [place] Foot Exam: Up to Date Diabetic Education: Not Completed Pneumovax: Not up to Date -- wishes to wait to 65 Influenza: Up to Date Aspirin: no    HYPERTENSION / HYPERLIPIDEMIA Continues Chlorthalidone, Micardis, Amlodipine, Zetia, and Crestor taking  every other day, tolerates this better. Seep apnea, he continues to use CPAP nightly.  History: Intraparenchymal hemorrhage diagnosed 11/03/20.  MRI on 03/05/21 -- noted continued evolution and regression of right frontal parenchymal hemorrhage. Neuro last  seen 11/28/20 and oncology 03/12/21 -- he was cleared from oncology. Cardiology last seen 10/17/2019 to return as needed only.  History of CVA, lacunar infarcts.  Had past MYO screen, normal with EF 55-65%.  Satisfied with current treatment? yes Duration of hypertension: chronic BP monitoring frequency: rarely BP range:  BP medication side effects: no Duration of hyperlipidemia: chronic Cholesterol medication side effects: no Cholesterol supplements: none Medication compliance: good compliance Aspirin: yes Recent stressors: no Recurrent headaches: no Visual changes: no Palpitations: no Dyspnea: no Chest pain: no Lower extremity edema: no Dizzy/lightheaded: no  The ASCVD Risk score (Arnett DK, et al., 2019) failed to calculate for the following reasons:   Risk score cannot be calculated because patient has a medical history suggesting prior/existing ASCVD  GERD Having occasionally, one day last week threw-up stomach acid.  Took his wife's Pepcid and this helped. GERD control status: uncontrolled Satisfied with current treatment? yes Heartburn frequency: occasional Medication side effects: no  Medication compliance: stable Previous GERD medications: Antacid use frequency:  a few times  a week Duration: varies Nature: burning Location: sternal Heartburn duration: varies Alleviatiating factors:  Pepcid Aggravating factors: unknown Dysphagia: no Odynophagia:  no Hematemesis: no Blood in stool: no EGD: no      06/28/2023   10:13 AM 03/28/2023   10:04 AM 09/26/2022   10:31 AM 03/25/2022   10:14 AM 09/23/2021   10:36 AM  Depression screen PHQ 2/9  Decreased Interest 0 0 0 0 0  Down, Depressed, Hopeless 0 0 0 0 0  PHQ - 2 Score 0 0 0 0 0  Altered sleeping 0 0 0 0 0  Tired, decreased energy 0 0 0 0 0  Change in appetite 0 0 0 0 0  Feeling bad or failure about yourself  0 0 0 0 0  Trouble concentrating 0 0 0 0 0  Moving slowly or fidgety/restless 0 0 0 0 0  Suicidal thoughts  0 0 0 0 0  PHQ-9 Score 0 0 0 0 0  Difficult doing work/chores Not difficult at all Not difficult at all Not difficult at all Not difficult at all Not difficult at all       06/28/2023   10:13 AM 03/28/2023   10:05 AM 09/26/2022   10:31 AM 03/25/2022   10:15 AM  GAD 7 : Generalized Anxiety Score  Nervous, Anxious, on Edge 0 0 0 0  Control/stop worrying 0 0 0 0  Worry too much - different things 0 0 0 0  Trouble relaxing 0 0 0 0  Restless 0 0 0 0  Easily annoyed or irritable 0 0 0 0  Afraid - awful might happen 0 0 0 0  Total GAD 7 Score 0 0 0 0  Anxiety Difficulty Not difficult at all Not difficult at all Not difficult at all Not difficult at all   Relevant past medical, surgical, family and social history reviewed and updated as indicated. Interim medical history since our last visit reviewed. Allergies and medications reviewed and updated.  Review of Systems  Constitutional:  Negative for activity change, diaphoresis, fatigue and fever.  Respiratory:  Negative for cough, chest tightness, shortness of breath and wheezing.   Cardiovascular:  Negative for chest pain, palpitations and leg swelling.  Gastrointestinal: Negative.   Neurological: Negative.   Psychiatric/Behavioral: Negative.      Per HPI unless specifically indicated above     Objective:    BP 115/66   Pulse 64   Temp 98.1 F (36.7 C) (Oral)   Ht 5\' 11"  (1.803 m)   Wt (!) 317 lb 3.2 oz (143.9 kg)   SpO2 98%   BMI 44.24 kg/m   Wt Readings from Last 3 Encounters:  06/28/23 (!) 317 lb 3.2 oz (143.9 kg)  03/28/23 (!) 315 lb 6.4 oz (143.1 kg)  01/24/23 (!) 316 lb (143.3 kg)    Physical Exam Vitals and nursing note reviewed.  Constitutional:      General: He is awake. He is not in acute distress.    Appearance: He is well-developed and well-groomed. He is obese. He is not ill-appearing or toxic-appearing.  HENT:     Head: Normocephalic and atraumatic.     Right Ear: Hearing normal. No drainage.     Left  Ear: Hearing normal. No drainage.  Eyes:     General: Lids are normal.        Right eye: No discharge.        Left eye: No discharge.     Conjunctiva/sclera: Conjunctivae normal.  Pupils: Pupils are equal, round, and reactive to light.  Neck:     Thyroid: No thyromegaly.     Vascular: No carotid bruit.  Cardiovascular:     Rate and Rhythm: Normal rate and regular rhythm.     Heart sounds: Normal heart sounds, S1 normal and S2 normal. No murmur heard.    No gallop.  Pulmonary:     Effort: Pulmonary effort is normal. No accessory muscle usage or respiratory distress.     Breath sounds: Normal breath sounds.  Abdominal:     General: Bowel sounds are normal. There is no distension.     Palpations: Abdomen is soft.     Tenderness: There is no abdominal tenderness.  Musculoskeletal:        General: Normal range of motion.     Cervical back: Normal range of motion and neck supple.     Right lower leg: No edema.     Left lower leg: No edema.  Skin:    General: Skin is warm and dry.     Capillary Refill: Capillary refill takes less than 2 seconds.     Findings: No rash.  Neurological:     Mental Status: He is alert and oriented to person, place, and time.     Gait: Gait is intact.     Deep Tendon Reflexes: Reflexes are normal and symmetric.     Reflex Scores:      Brachioradialis reflexes are 2+ on the right side and 2+ on the left side.      Patellar reflexes are 2+ on the right side and 2+ on the left side. Psychiatric:        Attention and Perception: Attention normal.        Mood and Affect: Mood normal.        Speech: Speech normal.        Behavior: Behavior normal. Behavior is cooperative.        Thought Content: Thought content normal.     Results for orders placed or performed in visit on 03/28/23  CBC with Differential/Platelet   Collection Time: 03/28/23 10:45 AM  Result Value Ref Range   WBC 7.5 3.4 - 10.8 x10E3/uL   RBC 4.75 4.14 - 5.80 x10E6/uL   Hemoglobin  14.3 13.0 - 17.7 g/dL   Hematocrit 16.1 09.6 - 51.0 %   MCV 91 79 - 97 fL   MCH 30.1 26.6 - 33.0 pg   MCHC 33.1 31.5 - 35.7 g/dL   RDW 04.5 40.9 - 81.1 %   Platelets 201 150 - 450 x10E3/uL   Neutrophils 73 Not Estab. %   Lymphs 17 Not Estab. %   Monocytes 7 Not Estab. %   Eos 3 Not Estab. %   Basos 0 Not Estab. %   Neutrophils Absolute 5.4 1.4 - 7.0 x10E3/uL   Lymphocytes Absolute 1.3 0.7 - 3.1 x10E3/uL   Monocytes Absolute 0.5 0.1 - 0.9 x10E3/uL   EOS (ABSOLUTE) 0.2 0.0 - 0.4 x10E3/uL   Basophils Absolute 0.0 0.0 - 0.2 x10E3/uL   Immature Granulocytes 0 Not Estab. %   Immature Grans (Abs) 0.0 0.0 - 0.1 x10E3/uL  Comprehensive metabolic panel   Collection Time: 03/28/23 10:45 AM  Result Value Ref Range   Glucose 121 (H) 70 - 99 mg/dL   BUN 15 8 - 27 mg/dL   Creatinine, Ser 9.14 0.76 - 1.27 mg/dL   eGFR 81 >78 GN/FAO/1.30   BUN/Creatinine Ratio 14 10 - 24   Sodium  140 134 - 144 mmol/L   Potassium 3.9 3.5 - 5.2 mmol/L   Chloride 101 96 - 106 mmol/L   CO2 22 20 - 29 mmol/L   Calcium 9.7 8.6 - 10.2 mg/dL   Total Protein 7.4 6.0 - 8.5 g/dL   Albumin 4.5 3.9 - 4.9 g/dL   Globulin, Total 2.9 1.5 - 4.5 g/dL   Bilirubin Total 0.6 0.0 - 1.2 mg/dL   Alkaline Phosphatase 75 44 - 121 IU/L   AST 58 (H) 0 - 40 IU/L   ALT 59 (H) 0 - 44 IU/L  PSA   Collection Time: 03/28/23 10:45 AM  Result Value Ref Range   Prostate Specific Ag, Serum 1.2 0.0 - 4.0 ng/mL  TSH   Collection Time: 03/28/23 10:45 AM  Result Value Ref Range   TSH 3.740 0.450 - 4.500 uIU/mL  Lipid Panel w/o Chol/HDL Ratio   Collection Time: 03/28/23 10:45 AM  Result Value Ref Range   Cholesterol, Total 118 100 - 199 mg/dL   Triglycerides 161 (H) 0 - 149 mg/dL   HDL 36 (L) >09 mg/dL   VLDL Cholesterol Cal 32 5 - 40 mg/dL   LDL Chol Calc (NIH) 50 0 - 99 mg/dL  HgB U0A   Collection Time: 03/28/23 10:45 AM  Result Value Ref Range   Hgb A1c MFr Bld 6.5 (H) 4.8 - 5.6 %   Est. average glucose Bld gHb Est-mCnc 140 mg/dL   Uric acid   Collection Time: 03/28/23 10:45 AM  Result Value Ref Range   Uric Acid 8.3 3.8 - 8.4 mg/dL      Assessment & Plan:   Problem List Items Addressed This Visit       Cardiovascular and Mediastinum   Aortic atherosclerosis (HCC)   Chronic.  Noted on CXR 12/25/20.  Recommend continuation of statin daily, avoid ASA at this time due to brain hemorrhage in past.      Hypertension associated with diabetes (HCC)   Chronic, stable . BP at goal today. Continue current medication regimen and recommend DASH diet at home, lowering sodium in diet to avoid fluid retention. Adjust regimen as needed.  Continue to collaborate with cardiology as needed.  Labs: CMP.  Will further consider discontinuation of Chlorthalidone if ongoing gout flares and possibly need to start Hydralazine or Clonidine -- avoid BB due to low resting HR.  Urine ALB 06 July 2023.      Relevant Orders   Bayer DCA Hb A1c Waived   Microalbumin, Urine Waived   Comprehensive metabolic panel     Respiratory   OSA on CPAP   Chronic, stable -- continue 100% use of CPAP at home.          Endocrine   Diabetes mellitus treated with oral medication (HCC)   Refer to plan of care for obesity.      Hyperlipidemia associated with type 2 diabetes mellitus (HCC)   Chronic, ongoing.  Continue current medication regimen and adjust as needed -- is on every other day Crestor due to myalgia with increased doses + continue Zetia daily  Will plan on repeat CMP and lipids. 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  A1c  today 6.4%.  Urine ALB 06 July 2023. Discussed options to include continue Metformin, GLP1, SGLT2, DPP4.  Will continue Metformin.  Educated him on  side effects and how medication works.  Offered referral to diabetes education, he declined at this time.   - Continue Metformin 1000 MG BID.  Recent kidney function stable. - Check sugars TID  - Continue ARB and statin therapy - Foot exam up to date.  Eye exam will obtain.      Relevant Orders   Bayer DCA Hb A1c Waived   Microalbumin, Urine Waived     Other   History of stroke   In August 2020 and intraparenchymal bleed June 2022. Continue statin daily and avoid ASA due to past bleed.  BP at goal today.  Check A1c, CMP, lipid Educated him on stroke prevention goals: BP <130/80 LDL <55 A1C <6.5       Morbid obesity (HCC)   BMI 44.24 with HTN/HLD, T2DM.  Recommended eating smaller high protein, low fat meals more frequently and exercising 30 mins a day 5 times a week with a goal of 10-15lb weight loss in the next 3 months. Patient voiced their understanding and motivation to adhere to these recommendations.         Follow up plan: Return in about 3 months (around 09/26/2023) for T2DM, HTN/HLD, GERD.

## 2023-06-28 NOTE — Assessment & Plan Note (Signed)
BMI 44.24 with HTN/HLD, T2DM.  Recommended eating smaller high protein, low fat meals more frequently and exercising 30 mins a day 5 times a week with a goal of 10-15lb weight loss in the next 3 months. Patient voiced their understanding and motivation to adhere to these recommendations.

## 2023-06-28 NOTE — Assessment & Plan Note (Signed)
Chronic.  Noted on CXR 12/25/20.  Recommend continuation of statin daily, avoid ASA at this time due to brain hemorrhage in past.

## 2023-06-29 ENCOUNTER — Encounter: Payer: Self-pay | Admitting: Nurse Practitioner

## 2023-06-29 LAB — COMPREHENSIVE METABOLIC PANEL
ALT: 73 [IU]/L — ABNORMAL HIGH (ref 0–44)
AST: 37 [IU]/L (ref 0–40)
Albumin: 4.2 g/dL (ref 3.9–4.9)
Alkaline Phosphatase: 110 [IU]/L (ref 44–121)
BUN/Creatinine Ratio: 15 (ref 10–24)
BUN: 18 mg/dL (ref 8–27)
Bilirubin Total: 0.4 mg/dL (ref 0.0–1.2)
CO2: 20 mmol/L (ref 20–29)
Calcium: 9.2 mg/dL (ref 8.6–10.2)
Chloride: 100 mmol/L (ref 96–106)
Creatinine, Ser: 1.19 mg/dL (ref 0.76–1.27)
Globulin, Total: 2.5 g/dL (ref 1.5–4.5)
Glucose: 143 mg/dL — ABNORMAL HIGH (ref 70–99)
Potassium: 3.7 mmol/L (ref 3.5–5.2)
Sodium: 140 mmol/L (ref 134–144)
Total Protein: 6.7 g/dL (ref 6.0–8.5)
eGFR: 69 mL/min/{1.73_m2} (ref >59)

## 2023-06-29 LAB — LIPID PANEL W/O CHOL/HDL RATIO
Cholesterol, Total: 95 mg/dL — ABNORMAL LOW (ref 100–199)
HDL: 32 mg/dL — ABNORMAL LOW (ref >39)
LDL Chol Calc (NIH): 38 mg/dL (ref 0–99)
Triglycerides: 142 mg/dL (ref 0–149)
VLDL Cholesterol Cal: 25 mg/dL (ref 5–40)

## 2023-06-29 NOTE — Progress Notes (Signed)
Contacted via MyChart   Good morning Thayer Ohm, your labs have returned: - Kidney function, creatinine and eGFR, remains normal.  Liver function shows mild elevation in ALT, but normal AST -- AST level has improved from previous check.  Focus heavily on healthy diet at home. - Lipid panel shows levels at goal.  Continue all current medications.  Any questions? Keep being amazing!!  Thank you for allowing me to participate in your care.  I appreciate you. Kindest regards, Korena Nass

## 2023-06-30 NOTE — Addendum Note (Signed)
Addended by: Aura Dials T on: 06/30/2023 12:19 PM   Modules accepted: Orders

## 2023-07-12 ENCOUNTER — Other Ambulatory Visit: Payer: Self-pay | Admitting: Nurse Practitioner

## 2023-07-12 NOTE — Telephone Encounter (Signed)
 Requested medication (s) are due for refill today - yes  Requested medication (s) are on the active medication list -yes  Future visit scheduled -yes  Last refill: 05/12/23 #30 1RF  Notes to clinic: non delegated Rx  Requested Prescriptions  Pending Prescriptions Disp Refills   cyclobenzaprine  (FLEXERIL ) 10 MG tablet [Pharmacy Med Name: CYCLOBENZAPRINE  10 MG TABLET] 30 tablet 1    Sig: TAKE 1 TABLET BY MOUTH EVERY DAY AT BEDTIME AS NEEDED     Not Delegated - Analgesics:  Muscle Relaxants Failed - 07/12/2023  8:51 AM      Failed - This refill cannot be delegated      Passed - Valid encounter within last 6 months    Recent Outpatient Visits           2 weeks ago Type 2 diabetes mellitus with obesity (HCC)   [place] [facility] [place], [place] T, NP   3 months ago Type 2 diabetes mellitus with obesity (HCC)   [place] [facility] [place], [place] T, NP   5 months ago Type 2 diabetes mellitus with obesity (HCC)   [place] [facility] [place], [place] T, NP   6 months ago Type 2 diabetes mellitus with obesity (HCC)   [place] [facility] [place], Melanie T, NP   9 months ago Morbid obesity (HCC)   [place] [facility] [place], Melanie DASEN, NP       Future Appointments             In 2 months Cannady, Jolene T, NP [place] [facility]               Requested Prescriptions  Pending Prescriptions Disp Refills   cyclobenzaprine  (FLEXERIL ) 10 MG tablet [Pharmacy Med Name: CYCLOBENZAPRINE  10 MG TABLET] 30 tablet 1    Sig: TAKE 1 TABLET BY MOUTH EVERY DAY AT BEDTIME AS NEEDED     Not Delegated - Analgesics:  Muscle Relaxants Failed - 07/12/2023  8:51 AM      Failed - This refill cannot be delegated      Passed - Valid encounter within last 6 months    Recent Outpatient Visits           2 weeks ago Type 2 diabetes mellitus with obesity (HCC)   [place] [facility] [place], [place] T, NP   3 months ago Type 2 diabetes mellitus with obesity (HCC)   [place] [facility] [place], [place] T, NP   5 months ago Type 2 diabetes mellitus with obesity (HCC)   [place] [facility] [place], [place] T, NP   6 months ago Type 2 diabetes mellitus with obesity (HCC)   [place] [facility] [place], Melanie T, NP   9 months ago Morbid obesity (HCC)   [place] [facility] [place], Melanie DASEN, NP       Future Appointments             In 2 months Cannady, Jolene T, NP [place] [facility], [facility]

## 2023-07-19 ENCOUNTER — Encounter: Payer: Self-pay | Admitting: Nurse Practitioner

## 2023-07-19 MED ORDER — ONETOUCH VERIO VI STRP: ORAL_STRIP | 12 refills | Status: AC

## 2023-09-05 ENCOUNTER — Other Ambulatory Visit: Payer: Self-pay | Admitting: Nurse Practitioner

## 2023-09-07 NOTE — Telephone Encounter (Signed)
 Requested medications are due for refill today.  yes  Requested medications are on the active medications list.  yes  Last refill. 07/12/2023 #30 1 rf  Future visit scheduled.   yes  Notes to clinic.  Refill not delegated.    Requested Prescriptions  Pending Prescriptions Disp Refills   cyclobenzaprine (FLEXERIL) 10 MG tablet [Pharmacy Med Name: CYCLOBENZAPRINE 10 MG TABLET] 30 tablet 1    Sig: TAKE 1 TABLET BY MOUTH EVERY DAY AT BEDTIME AS NEEDED     Not Delegated - Analgesics:  Muscle Relaxants Failed - 09/07/2023  8:10 AM      Failed - This refill cannot be delegated      Failed - Valid encounter within last 6 months    Recent Outpatient Visits   None     Future Appointments             In 3 weeks Cannady, Dorie Rank, NP [place] [facility], [facility]

## 2023-09-24 NOTE — Patient Instructions (Signed)
Be Involved in Caring For Your Health:  Taking Medications When medications are taken as directed, they can greatly improve your health. But if they are not taken as prescribed, they may not work. In some cases, not taking them correctly can be harmful. To help ensure your treatment remains effective and safe, understand your medications and how to take them. Bring your medications to each visit for review by your provider.  Your lab results, notes, and after visit summary will be available on My Chart. We strongly encourage you to use this feature. If lab results are abnormal the clinic will contact you with the appropriate steps. If the clinic does not contact you assume the results are satisfactory. You can always view your results on My Chart. If you have questions regarding your health or results, please contact the clinic during office hours. You can also ask questions on My Chart.  We at [facility] are grateful that you chose [place] to provide your care. We strive to provide evidence-based and compassionate care and are always looking for feedback. If you get a survey from the clinic please complete this so we can hear your opinions.  Diabetes Mellitus and Exercise Regular exercise is important for your health, especially if you have diabetes mellitus. Exercise is not just about losing weight. It can also help you increase muscle strength and bone density and reduce body fat and stress. This can help your level of endurance and make you more fit and flexible. Why should I exercise if I have diabetes? Exercise has many benefits for people with diabetes. It can: Help lower and control your blood sugar (glucose). Help your body respond better and become more sensitive to the hormone insulin. Reduce how much insulin your body needs. Lower your risk for heart disease by: Lowering how much "bad" cholesterol and triglycerides you have in your body. Increasing how much "good" cholesterol  you have in your body. Lowering your blood pressure. Lowering your blood glucose levels. What is my activity plan? Your health care provider or an expert trained in diabetes care (certified diabetes educator) can help you make an activity plan. This plan can help you find the type of exercise that works for you. It may also tell you how often to exercise and for how long. Be sure to: Get at least 150 minutes of medium-intensity or high-intensity exercise each week. This may involve brisk walking, biking, or water aerobics. Do stretching and strengthening exercises at least 2 times a week. This may involve yoga or weight lifting. Spread out your activity over at least 3 days of the week. Get some form of physical activity each day. Do not go more than 2 days in a row without some kind of activity. Avoid being inactive for more than 30 minutes at a time. Take frequent breaks to walk or stretch. Choose activities that you enjoy. Set goals that you know you can accomplish. Start slowly and increase the intensity of your exercise over time. How do I manage my diabetes during exercise?  Monitor your blood glucose Check your blood glucose before and after you exercise. If your blood glucose is 240 mg/dL (40.9 mmol/L) or higher before you exercise, check your urine for ketones. These are chemicals created by the liver. If you have ketones in your urine, do not exercise until your blood glucose returns to normal. If your blood glucose is 100 mg/dL (5.6 mmol/L) or lower, eat a snack that has 15-20 grams of carbohydrate in  it. Check your blood glucose 15 minutes after the snack to make sure that your level is above 100 mg/dL (5.6 mmol/L) before you start to exercise. Your risk for low blood glucose (hypoglycemia) goes up during and after exercise. Know the symptoms of this condition and how to treat it. Follow these instructions at home: Keep a carbohydrate snack on hand for use before, during, and after  exercise. This can help prevent or treat hypoglycemia. Avoid injecting insulin into parts of your body that are going to be used during exercise. This may include: Your arms, when you are going to play tennis. Your legs, when you are about to go jogging. Keep track of your exercise habits. This can help you and your health care provider watch and adjust your activity plan. Write down: What you eat before and after you exercise. Blood glucose levels before and after you exercise. The type and amount of exercise you do. Talk to your health care provider before you start a new activity. They may need to: Make sure that the activity is safe for you. Adjust your insulin, other medicines, and food that you eat. Drink water while you exercise. This can stop you from losing too much water (dehydration). It can also prevent problems caused by having a lot of heat in your body (heat stroke). Where to find more information American Diabetes Association: diabetes.org Association of Diabetes Care & Education Specialists: diabeteseducator.org This information is not intended to replace advice given to you by your health care provider. Make sure you discuss any questions you have with your health care provider. Document Revised: 11/10/2021 Document Reviewed: 11/10/2021 Elsevier Patient Education  2024 [employer].

## 2023-09-29 ENCOUNTER — Encounter: Payer: Self-pay | Admitting: Nurse Practitioner

## 2023-09-29 ENCOUNTER — Ambulatory Visit (INDEPENDENT_AMBULATORY_CARE_PROVIDER_SITE_OTHER): Payer: Self-pay | Admitting: Nurse Practitioner

## 2023-09-29 VITALS — BP 109/67 | HR 56 | Temp 98.7°F | Ht 71.0 in | Wt 320.2 lb

## 2023-09-29 DIAGNOSIS — Z7984 Long term (current) use of oral hypoglycemic drugs: Secondary | ICD-10-CM

## 2023-09-29 DIAGNOSIS — E1169 Type 2 diabetes mellitus with other specified complication: Secondary | ICD-10-CM | POA: Diagnosis not present

## 2023-09-29 DIAGNOSIS — I700 Atherosclerosis of aorta: Secondary | ICD-10-CM | POA: Diagnosis not present

## 2023-09-29 DIAGNOSIS — E785 Hyperlipidemia, unspecified: Secondary | ICD-10-CM

## 2023-09-29 DIAGNOSIS — Z8673 Personal history of transient ischemic attack (TIA), and cerebral infarction without residual deficits: Secondary | ICD-10-CM

## 2023-09-29 DIAGNOSIS — E1159 Type 2 diabetes mellitus with other circulatory complications: Secondary | ICD-10-CM | POA: Diagnosis not present

## 2023-09-29 DIAGNOSIS — I152 Hypertension secondary to endocrine disorders: Secondary | ICD-10-CM

## 2023-09-29 DIAGNOSIS — R7989 Other specified abnormal findings of blood chemistry: Secondary | ICD-10-CM

## 2023-09-29 DIAGNOSIS — E669 Obesity, unspecified: Secondary | ICD-10-CM

## 2023-09-29 DIAGNOSIS — E119 Type 2 diabetes mellitus without complications: Secondary | ICD-10-CM | POA: Diagnosis not present

## 2023-09-29 DIAGNOSIS — G4733 Obstructive sleep apnea (adult) (pediatric): Secondary | ICD-10-CM

## 2023-09-29 LAB — BAYER DCA HB A1C WAIVED: HB A1C (BAYER DCA - WAIVED): 6.7 % — ABNORMAL HIGH (ref 4.8–5.6)

## 2023-09-29 NOTE — Progress Notes (Deleted)
 Ht 5\' 11"  (1.803 m)   Wt (!) 320 lb 3.2 oz (145.2 kg)   BMI 44.66 kg/m    Subjective:    Patient ID: Paul Cannon, male    DOB: 07-06-1960, 63 y.o.   MRN: [id number]  HPI: Paul Cannon is a 63 y.o. male  Chief Complaint  Patient presents with   Diabetes   Hyperlipidemia   Hypertension   DIABETES January A1c 6.4%.  Continues Metformin  1000 MG BID.  Hypoglycemic episodes:{Blank single:19197::"yes","no"} Polydipsia/polyuria: {Blank single:19197::"yes","no"} Visual disturbance: {Blank single:19197::"yes","no"} Chest pain: {Blank single:19197::"yes","no"} Paresthesias: {Blank single:19197::"yes","no"} Glucose Monitoring: {Blank single:19197::"yes","no"}  Accucheck frequency: {Blank single:19197::"Not Checking","Daily","BID","TID"}  Fasting glucose:  Post prandial:  Evening:  Before meals: Taking Insulin?: {Blank single:19197::"yes","no"}  Long acting insulin:  Short acting insulin: Blood Pressure Monitoring: {Blank single:19197::"not checking","rarely","daily","weekly","monthly","a few times a day","a few times a week","a few times a month"} Retinal Examination: {Blank single:19197::"Up to Date","Not up to Date"} Foot Exam: {Blank single:19197::"Up to Date","Not up to Date"} Diabetic Education: {Blank single:19197::"Completed","Not Completed"} Pneumovax: {Blank single:19197::"Up to Date","Not up to Date","unknown"} Influenza: {Blank single:19197::"Up to Date","Not up to Date","unknown"} Aspirin : {Blank single:19197::"yes","no"}   HYPERTENSION / HYPERLIPIDEMIA Takes Chlorthalidone , Micardis , Amlodipine , Zetia .  Takes Crestor  every other day, tolerates this better than daily. He continues to use CPAP nightly, 100%.   History: Intraparenchymal hemorrhage diagnosed 11/03/20.  MRI on 03/05/21 -- noted continued evolution and regression of right frontal parenchymal hemorrhage. Neuro last seen 11/28/20 and oncology 03/12/21 -- he was cleared from oncology. Cardiology last  seen 10/17/2019 to return as needed only.  History of CVA, lacunar infarcts.  Had past MYO screen, normal with EF 55-65%.  Satisfied with current treatment? {Blank single:19197::"yes","no"} Duration of hypertension: {Blank single:19197::"chronic","months","years"} BP monitoring frequency: {Blank single:19197::"not checking","rarely","daily","weekly","monthly","a few times a day","a few times a week","a few times a month"} BP range:  BP medication side effects: {Blank single:19197::"yes","no"} Duration of hyperlipidemia: {Blank single:19197::"chronic","months","years"} Cholesterol medication side effects: {Blank single:19197::"yes","no"} Cholesterol supplements: {Blank multiple:19196::"none","fish oil","niacin","red yeast rice"} Medication compliance: {Blank single:19197::"excellent compliance","good compliance","fair compliance","poor compliance"} Aspirin : {Blank single:19197::"yes","no"} Recent stressors: {Blank single:19197::"yes","no"} Recurrent headaches: {Blank single:19197::"yes","no"} Visual changes: {Blank single:19197::"yes","no"} Palpitations: {Blank single:19197::"yes","no"} Dyspnea: {Blank single:19197::"yes","no"} Chest pain: {Blank single:19197::"yes","no"} Lower extremity edema: {Blank single:19197::"yes","no"} Dizzy/lightheaded: {Blank single:19197::"yes","no"}   Relevant past medical, surgical, family and social history reviewed and updated as indicated. Interim medical history since our last visit reviewed. Allergies and medications reviewed and updated.  Review of Systems  Per HPI unless specifically indicated above     Objective:    Ht 5\' 11"  (1.803 m)   Wt (!) 320 lb 3.2 oz (145.2 kg)   BMI 44.66 kg/m   Wt Readings from Last 3 Encounters:  09/29/23 (!) 320 lb 3.2 oz (145.2 kg)  06/28/23 (!) 317 lb 3.2 oz (143.9 kg)  03/28/23 (!) 315 lb 6.4 oz (143.1 kg)    Physical Exam  Results for orders placed or performed in visit on 06/28/23  Bayer DCA Hb A1c Waived    Collection Time: 06/28/23 10:14 AM  Result Value Ref Range   HB A1C (BAYER DCA - WAIVED) 6.4 (H) 4.8 - 5.6 %  Microalbumin, Urine Waived   Collection Time: 06/28/23 10:14 AM  Result Value Ref Range   Microalb, Ur Waived 80 (H) 0 - 19 mg/L   Creatinine, Urine Waived 300 10 - 300 mg/dL   Microalb/Creat Ratio 30-300 (H) <30 mg/g  Comprehensive metabolic panel   Collection Time: 06/28/23 10:14 AM  Result Value Ref Range   Glucose 143 (H) 70 - 99 mg/dL  BUN 18 8 - 27 mg/dL   Creatinine, Ser 7.82 0.76 - 1.27 mg/dL   eGFR 69 >95 AO/ZHY/8.65   BUN/Creatinine Ratio 15 10 - 24   Sodium 140 134 - 144 mmol/L   Potassium 3.7 3.5 - 5.2 mmol/L   Chloride 100 96 - 106 mmol/L   CO2 20 20 - 29 mmol/L   Calcium  9.2 8.6 - 10.2 mg/dL   Total Protein 6.7 6.0 - 8.5 g/dL   Albumin 4.2 3.9 - 4.9 g/dL   Globulin, Total 2.5 1.5 - 4.5 g/dL   Bilirubin Total 0.4 0.0 - 1.2 mg/dL   Alkaline Phosphatase 110 44 - 121 IU/L   AST 37 0 - 40 IU/L   ALT 73 (H) 0 - 44 IU/L  Lipid Panel w/o Chol/HDL Ratio   Collection Time: 06/28/23 10:14 AM  Result Value Ref Range   Cholesterol, Total 95 (L) 100 - 199 mg/dL   Triglycerides 784 0 - 149 mg/dL   HDL 32 (L) >69 mg/dL   VLDL Cholesterol Cal 25 5 - 40 mg/dL   LDL Chol Calc (NIH) 38 0 - 99 mg/dL      Assessment & Plan:   Problem List Items Addressed This Visit       Cardiovascular and Mediastinum   Hypertension associated with diabetes (HCC)   Relevant Orders   Bayer DCA Hb A1c Waived   Comprehensive metabolic panel with GFR   Aortic atherosclerosis (HCC)   Relevant Orders   Comprehensive metabolic panel with GFR   Lipid Panel w/o Chol/HDL Ratio     Respiratory   OSA on CPAP     Endocrine   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

## 2023-09-29 NOTE — Assessment & Plan Note (Signed)
 BMI 44.66. Recommend to eat smaller, more frequent consisted of high protein, low fat meals. Exercising 5 times per week for 30 mins per day. Weight loss goal of 10-15 lbs in the next 3 months. Patient verbalized understanding and motivation. Return in 3 months.

## 2023-09-29 NOTE — Assessment & Plan Note (Signed)
 Chronic, ongoing. Continue current medication regimen Crestor  every other day, and Zetia  daily. Labs today. Return in 3 months.

## 2023-09-29 NOTE — Assessment & Plan Note (Signed)
 Stable at this time. Labs today. Continue current medication regimen. Return in 3 months.

## 2023-09-29 NOTE — Progress Notes (Signed)
 BP 109/67   Pulse (!) 56   Temp 98.7 F (37.1 C) (Oral)   Ht 5\' 11"  (1.803 m)   Wt (!) 320 lb 3.2 oz (145.2 kg)   SpO2 95%   BMI 44.66 kg/m    Subjective:    Patient ID: Paul Cannon, male    DOB: 1960/08/18, 63 y.o.   MRN: [id number]  HPI: Paul Cannon is a 63 y.o. male no new issues or concerns.  Chief Complaint  Patient presents with   Diabetes   Hyperlipidemia   Hypertension   NOTE WRITTEN BY DNP STUDENT.  ASSESSMENT AND PLAN OF CARE REVIEWED WITH STUDENT, AGREE WITH ABOVE FINDINGS AND PLAN.   DIABETES January A1c 6.4%.  Continues Metformin  1000 MG BID.  Hypoglycemic episodes:no Polydipsia/polyuria: no Visual disturbance: no Chest pain: no Paresthesias: no Glucose Monitoring: yes  Accucheck frequency: BID  Taking Insulin?: no Blood Pressure Monitoring: rarely Retinal Examination: Up to Date Foot Exam: Up to Date Diabetic Education: Completed Pneumovax: Up to Date Influenza: unknown Aspirin : no   HYPERTENSION / HYPERLIPIDEMIA Takes Chlorthalidone , Micardis , Amlodipine , Zetia .  Takes Crestor  every other day, tolerates this better than daily. He continues to use CPAP nightly, 100%.   History: Intraparenchymal hemorrhage diagnosed 11/03/20.  MRI on 03/05/21 -- noted continued evolution and regression of right frontal parenchymal hemorrhage. Neuro last seen 11/28/20 and oncology 03/12/21 -- he was cleared from oncology. Cardiology last seen 10/17/2019 to return as needed only.  History of CVA, lacunar infarcts.  Had past MYO screen, normal with EF 55-65%.  Satisfied with current treatment? yes Duration of hypertension: chronic BP monitoring frequency: a few times a week BP range:  BP medication side effects: no Duration of hyperlipidemia: chronic Cholesterol medication side effects: no Cholesterol supplements: none Medication compliance: good compliance Aspirin : no Recent stressors: no Recurrent headaches: no Visual changes: no Palpitations:  no Dyspnea: no Chest pain: no Lower extremity edema: no Dizzy/lightheaded: no   Relevant past medical, surgical, family and social history reviewed and updated as indicated. Interim medical history since our last visit reviewed. Allergies and medications reviewed and updated.  Review of Systems  Constitutional:  Negative for activity change and appetite change.  HENT: Negative.    Eyes: Negative.   Respiratory:  Negative for chest tightness and shortness of breath.   Cardiovascular:  Negative for chest pain and leg swelling.  Gastrointestinal: Negative.   Endocrine: Negative for polydipsia and polyuria.  Genitourinary: Negative.   Musculoskeletal:  Negative for back pain and joint swelling.  Skin: Negative.   Allergic/Immunologic: Negative.   Hematological: Negative.   Psychiatric/Behavioral:  The patient is not nervous/anxious.     Per HPI unless specifically indicated above     Objective:    BP 109/67   Pulse (!) 56   Temp 98.7 F (37.1 C) (Oral)   Ht 5\' 11"  (1.803 m)   Wt (!) 320 lb 3.2 oz (145.2 kg)   SpO2 95%   BMI 44.66 kg/m   Wt Readings from Last 3 Encounters:  09/29/23 (!) 320 lb 3.2 oz (145.2 kg)  06/28/23 (!) 317 lb 3.2 oz (143.9 kg)  03/28/23 (!) 315 lb 6.4 oz (143.1 kg)    Physical Exam Vitals and nursing note reviewed.  Constitutional:      Appearance: Normal appearance. He is well-developed and well-groomed. He is morbidly obese. He is not ill-appearing.  Neck:     Thyroid : No thyroid  mass.     Vascular: No carotid bruit.  Cardiovascular:  Rate and Rhythm: Normal rate and regular rhythm.     Heart sounds: Normal heart sounds. No murmur heard. Pulmonary:     Effort: Pulmonary effort is normal. No accessory muscle usage.     Breath sounds: Normal breath sounds and air entry. No wheezing.  Abdominal:     General: Bowel sounds are normal.     Palpations: Abdomen is soft.  Musculoskeletal:        General: Normal range of motion.     Cervical  back: Normal range of motion and neck supple.     Right lower leg: No edema.     Left lower leg: No edema.  Lymphadenopathy:     Cervical: No cervical adenopathy.     Right cervical: No superficial cervical adenopathy.    Left cervical: No superficial cervical adenopathy.  Skin:    General: Skin is warm and dry.  Neurological:     General: No focal deficit present.     Mental Status: He is alert and oriented to person, place, and time. Mental status is at baseline.     Deep Tendon Reflexes: Reflexes are normal and symmetric.  Psychiatric:        Attention and Perception: Attention and perception normal.        Mood and Affect: Mood and affect normal.        Speech: Speech normal.        Behavior: Behavior normal. Behavior is cooperative.        Thought Content: Thought content normal.        Cognition and Memory: Cognition and memory normal.        Judgment: Judgment normal.     Results for orders placed or performed in visit on 09/29/23  Bayer DCA Hb A1c Waived   Collection Time: 09/29/23 10:50 AM  Result Value Ref Range   HB A1C (BAYER DCA - WAIVED) 6.7 (H) 4.8 - 5.6 %      Assessment & Plan:   Problem List Items Addressed This Visit       Cardiovascular and Mediastinum   Hypertension associated with diabetes (HCC)   Chronic, stable. BP at goal today in office. Continue current medication regimen. Will adjust as needed. Recommend to focus on DASH diet at home. Decrease sodium intake to avoid fluid retention. Will continue to collaborate with cardiology as needed. Labs: CMP. Return in 3 months.      Relevant Orders   Bayer DCA Hb A1c Waived (Completed)   Comprehensive metabolic panel with GFR   Aortic atherosclerosis (HCC)   Chronic, ongoing. Recommend to continue statin daily. Will continue to avoid ASA at this time. Return in 3 months.      Relevant Orders   Comprehensive metabolic panel with GFR   Lipid Panel w/o Chol/HDL Ratio     Respiratory   OSA on CPAP    Chronic, stable. Recommend to continue 100% use of CPAP at home.        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  A1c today 6.7%.  Urine ALB 06 July 2023. Discussed options to include continue Metformin , GLP1, SGLT2, DPP4.  Will continue Metformin .  Educated him on side effects and how medication works.  Offered referral to diabetes education, he declined at this time.   - Continue Metformin  1000 MG BID.  Recent kidney function stable. - Check sugars TID  - Continue ARB  and statin therapy - Foot exam up to date.  Eye exam will obtain.      Relevant Orders   Bayer DCA Hb A1c Waived (Completed)   Comprehensive metabolic panel with GFR   Hyperlipidemia associated with type 2 diabetes mellitus (HCC)   Chronic, ongoing. Continue current medication regimen Crestor  every other day, and Zetia  daily. Labs today. Return in 3 months.      Relevant Orders   Bayer DCA Hb A1c Waived (Completed)   Comprehensive metabolic panel with GFR   Lipid Panel w/o Chol/HDL Ratio   Diabetes mellitus treated with oral medication (HCC)   Chronic, stable. A1c 6.7% today. Continue current medication regimen. Continue to focus on diet and exercise. Return in 3 months.      Relevant Orders   Bayer DCA Hb A1c Waived (Completed)   Comprehensive metabolic panel with GFR     Other   Morbid obesity (HCC)   BMI 44.66. Recommend to eat smaller, more frequent consisted of high protein, low fat meals. Exercising 5 times per week for 30 mins per day. Weight loss goal of 10-15 lbs in the next 3 months. Patient verbalized understanding and motivation. Return in 3 months.      History of stroke   Stable at this time. Continue current plan of care. Return in 3 months.      Elevated LFTs   Stable at this time. Labs today. Continue current medication regimen. Return in 3 months.      Relevant Orders   Comprehensive metabolic  panel with GFR     Follow up plan: Return in about 3 months (around 12/29/2023) for T2DM, HTN/HLD.

## 2023-09-29 NOTE — Assessment & Plan Note (Addendum)
 Chronic, stable. BP at goal today in office. Continue current medication regimen. Will adjust as needed. Recommend to focus on DASH diet at home. Decrease sodium intake to avoid fluid retention. Will continue to collaborate with cardiology as needed. Labs: CMP. Return in 3 months.

## 2023-09-29 NOTE — Progress Notes (Signed)
 BP 109/67   Pulse (!) 56   Temp 98.7 F (37.1 C) (Oral)   Ht 5\' 11"  (1.803 m)   Wt (!) 320 lb 3.2 oz (145.2 kg)   SpO2 95%   BMI 44.66 kg/m    Subjective:    Patient ID: Paul Cannon, male    DOB: 1960/07/23, 63 y.o.   MRN: [id number]  HPI: Paul Cannon is a 63 y.o. male no new concerns or issues to report.  Chief Complaint  Patient presents with  . Diabetes  . Hyperlipidemia  . Hypertension   DIABETES January A1c 6.4%.  Continues Metformin  1000 MG BID.  Hypoglycemic episodes:no Polydipsia/polyuria: no Visual disturbance: no Chest pain: no Paresthesias: no Glucose Monitoring: yes  Accucheck frequency:  Occasionally  Taking Insulin?: no Blood Pressure Monitoring: rarely Retinal Examination: Not up to Date Foot Exam: Up to Date Diabetic Education: Not Completed Pneumovax: Not up to Date Influenza: Up to Date Aspirin : no   HYPERTENSION / HYPERLIPIDEMIA Takes Chlorthalidone , Micardis , Amlodipine , Zetia .  Takes Crestor  every other day, tolerates this better than daily. He continues to use CPAP nightly, 100%.   History: Intraparenchymal hemorrhage diagnosed 11/03/20.  MRI on 03/05/21 -- noted continued evolution and regression of right frontal parenchymal hemorrhage. Neuro last seen 11/28/20 and oncology 03/12/21 -- he was cleared from oncology. Cardiology last seen 10/17/2019 to return as needed only.  History of CVA, lacunar infarcts.  Had past MYO screen, normal with EF 55-65%.  Satisfied with current treatment? yes Duration of hypertension: chronic BP monitoring frequency: rarely BP medication side effects: no Duration of hyperlipidemia: chronic Cholesterol medication side effects: no Cholesterol supplements: none Medication compliance: good compliance Aspirin : no Recent stressors: no Recurrent headaches: no Visual changes: no Palpitations: no Dyspnea: no Chest pain: no Lower extremity edema: no Dizzy/lightheaded: no   Relevant past medical,  surgical, family and social history reviewed and updated as indicated. Interim medical history since our last visit reviewed. Allergies and medications reviewed and updated.  Review of Systems  Constitutional:  Negative for activity change and appetite change.  HENT: Negative.    Eyes: Negative.   Respiratory:  Negative for chest tightness and shortness of breath.   Cardiovascular:  Negative for chest pain and leg swelling.  Gastrointestinal: Negative.   Endocrine: Negative for polyphagia and polyuria.  Genitourinary: Negative.   Musculoskeletal:  Negative for back pain and joint swelling.  Skin: Negative.   Allergic/Immunologic: Negative.   Neurological:  Negative for light-headedness and headaches.  Hematological: Negative.   Psychiatric/Behavioral:  The patient is not nervous/anxious.     Per HPI unless specifically indicated above     Objective:    BP 109/67   Pulse (!) 56   Temp 98.7 F (37.1 C) (Oral)   Ht 5\' 11"  (1.803 m)   Wt (!) 320 lb 3.2 oz (145.2 kg)   SpO2 95%   BMI 44.66 kg/m   Wt Readings from Last 3 Encounters:  09/29/23 (!) 320 lb 3.2 oz (145.2 kg)  06/28/23 (!) 317 lb 3.2 oz (143.9 kg)  03/28/23 (!) 315 lb 6.4 oz (143.1 kg)    Physical Exam Vitals and nursing note reviewed.  Constitutional:      General: He is not in acute distress.    Appearance: Normal appearance. He is well-developed and well-groomed. He is morbidly obese. He is not ill-appearing.  Neck:     Thyroid : No thyroid  mass.     Vascular: No carotid bruit.  Cardiovascular:     Rate  and Rhythm: Normal rate and regular rhythm.     Heart sounds: Normal heart sounds. No murmur heard. Pulmonary:     Effort: Pulmonary effort is normal. No respiratory distress.     Breath sounds: Normal breath sounds and air entry. No wheezing.  Musculoskeletal:        General: Normal range of motion.     Cervical back: Normal range of motion and neck supple.     Right lower leg: No edema.     Left lower  leg: No edema.  Lymphadenopathy:     Cervical: No cervical adenopathy.     Right cervical: No superficial cervical adenopathy.    Left cervical: No superficial cervical adenopathy.  Skin:    General: Skin is warm and dry.  Neurological:     General: No focal deficit present.     Mental Status: He is alert and oriented to person, place, and time. Mental status is at baseline.     Deep Tendon Reflexes: Reflexes are normal and symmetric.  Psychiatric:        Attention and Perception: Attention normal.        Mood and Affect: Mood and affect normal.        Speech: Speech normal.        Behavior: Behavior normal. Behavior is cooperative.        Thought Content: Thought content normal.        Cognition and Memory: Cognition and memory normal.        Judgment: Judgment normal.    Results for orders placed or performed in visit on 06/28/23  Bayer DCA Hb A1c Waived   Collection Time: 06/28/23 10:14 AM  Result Value Ref Range   HB A1C (BAYER DCA - WAIVED) 6.4 (H) 4.8 - 5.6 %  Microalbumin, Urine Waived   Collection Time: 06/28/23 10:14 AM  Result Value Ref Range   Microalb, Ur Waived 80 (H) 0 - 19 mg/L   Creatinine, Urine Waived 300 10 - 300 mg/dL   Microalb/Creat Ratio 30-300 (H) <30 mg/g  Comprehensive metabolic panel   Collection Time: 06/28/23 10:14 AM  Result Value Ref Range   Glucose 143 (H) 70 - 99 mg/dL   BUN 18 8 - 27 mg/dL   Creatinine, Ser 6.04 0.76 - 1.27 mg/dL   eGFR 69 >54 UJ/WJX/9.14   BUN/Creatinine Ratio 15 10 - 24   Sodium 140 134 - 144 mmol/L   Potassium 3.7 3.5 - 5.2 mmol/L   Chloride 100 96 - 106 mmol/L   CO2 20 20 - 29 mmol/L   Calcium  9.2 8.6 - 10.2 mg/dL   Total Protein 6.7 6.0 - 8.5 g/dL   Albumin 4.2 3.9 - 4.9 g/dL   Globulin, Total 2.5 1.5 - 4.5 g/dL   Bilirubin Total 0.4 0.0 - 1.2 mg/dL   Alkaline Phosphatase 110 44 - 121 IU/L   AST 37 0 - 40 IU/L   ALT 73 (H) 0 - 44 IU/L  Lipid Panel w/o Chol/HDL Ratio   Collection Time: 06/28/23 10:14 AM   Result Value Ref Range   Cholesterol, Total 95 (L) 100 - 199 mg/dL   Triglycerides 782 0 - 149 mg/dL   HDL 32 (L) >95 mg/dL   VLDL Cholesterol Cal 25 5 - 40 mg/dL   LDL Chol Calc (NIH) 38 0 - 99 mg/dL      Assessment & Plan:   Problem List Items Addressed This Visit       Cardiovascular and Mediastinum  Hypertension associated with diabetes (HCC)   Relevant Orders   Bayer DCA Hb A1c Waived   Comprehensive metabolic panel with GFR   Aortic atherosclerosis (HCC)   Relevant Orders   Comprehensive metabolic panel with GFR   Lipid Panel w/o Chol/HDL Ratio     Respiratory   OSA on CPAP     Endocrine   Hyperlipidemia associated with type 2 diabetes mellitus (HCC)   Relevant Orders   Bayer DCA Hb A1c Waived   Comprehensive metabolic panel with GFR   Lipid Panel w/o Chol/HDL Ratio   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

## 2023-09-29 NOTE — Assessment & Plan Note (Addendum)
 Diagnosed initially April 2024 and wanted to maintain diet focus.  A1c today 6.7%.  Urine ALB 06 July 2023. Discussed options to include continue Metformin , GLP1, SGLT2, DPP4.  Will continue Metformin .  Educated him on side effects and how medication works.  Offered referral to diabetes education, he declined at this time.   - Continue Metformin  1000 MG BID.  Recent kidney function stable. - Check sugars TID  - Continue ARB and statin therapy - Foot exam up to date.  Eye exam will obtain.

## 2023-09-29 NOTE — Assessment & Plan Note (Signed)
 Chronic, stable. A1c 6.7% today. Continue current medication regimen. Continue to focus on diet and exercise. Return in 3 months.

## 2023-09-29 NOTE — Assessment & Plan Note (Signed)
 Chronic, stable. Recommend to continue 100% use of CPAP at home.

## 2023-09-29 NOTE — Assessment & Plan Note (Signed)
 Stable at this time. Continue current plan of care. Return in 3 months.

## 2023-09-29 NOTE — Assessment & Plan Note (Signed)
 Chronic, ongoing. Recommend to continue statin daily. Will continue to avoid ASA at this time. Return in 3 months.

## 2023-09-30 LAB — COMPREHENSIVE METABOLIC PANEL WITH GFR
ALT: 67 IU/L — ABNORMAL HIGH (ref 0–44)
AST: 60 IU/L — ABNORMAL HIGH (ref 0–40)
Albumin: 4.3 g/dL (ref 3.9–4.9)
Alkaline Phosphatase: 70 IU/L (ref 44–121)
BUN/Creatinine Ratio: 16 (ref 10–24)
BUN: 20 mg/dL (ref 8–27)
Bilirubin Total: 0.3 mg/dL (ref 0.0–1.2)
CO2: 19 mmol/L — ABNORMAL LOW (ref 20–29)
Calcium: 9.4 mg/dL (ref 8.6–10.2)
Chloride: 104 mmol/L (ref 96–106)
Creatinine, Ser: 1.26 mg/dL (ref 0.76–1.27)
Globulin, Total: 2.7 g/dL (ref 1.5–4.5)
Glucose: 163 mg/dL — ABNORMAL HIGH (ref 70–99)
Potassium: 3.7 mmol/L (ref 3.5–5.2)
Sodium: 142 mmol/L (ref 134–144)
Total Protein: 7 g/dL (ref 6.0–8.5)
eGFR: 64 mL/min/{1.73_m2} (ref >59)

## 2023-09-30 LAB — LIPID PANEL W/O CHOL/HDL RATIO
Cholesterol, Total: 97 mg/dL — ABNORMAL LOW (ref 100–199)
HDL: 35 mg/dL — ABNORMAL LOW (ref >39)
LDL Chol Calc (NIH): 37 mg/dL (ref 0–99)
Triglycerides: 143 mg/dL (ref 0–149)
VLDL Cholesterol Cal: 25 mg/dL (ref 5–40)

## 2023-10-01 ENCOUNTER — Encounter: Payer: Self-pay | Admitting: Nurse Practitioner

## 2023-10-01 NOTE — Progress Notes (Signed)
 Contacted via MyChart   Good morning Larinda Plover, your labs have returned: - Kidney function, creatinine and eGFR, remains normal.  Liver function continues to show elevations.  I would recommend we get some imaging of liver via ultrasound to further assess.  Would you be okay with this? - Lipid panel shows levels at goal.  Any questions? Keep being amazing!!  Thank you for allowing me to participate in your care.  I appreciate you. Kindest regards, Kashtyn Jankowski

## 2023-10-07 ENCOUNTER — Other Ambulatory Visit: Payer: Self-pay | Admitting: Family Medicine

## 2023-10-10 NOTE — Telephone Encounter (Signed)
 Requested medications are due for refill today.  yes  Requested medications are on the active medications list.  yes  Last refill. 09/07/2023 #30 0 rf  Future visit scheduled.   yes  Notes to clinic.  Refill not delegated.    Requested Prescriptions  Pending Prescriptions Disp Refills   cyclobenzaprine  (FLEXERIL ) 10 MG tablet [Pharmacy Med Name: CYCLOBENZAPRINE  10 MG TABLET] 30 tablet 0    Sig: TAKE 1 TABLET BY MOUTH EVERY DAY AT BEDTIME AS NEEDED     Not Delegated - Analgesics:  Muscle Relaxants Failed - 10/10/2023  9:48 AM      Failed - This refill cannot be delegated      Passed - Valid encounter within last 6 months    Recent Outpatient Visits           1 week ago Type 2 diabetes mellitus with obesity (HCC)   [place] [facility] [place], Lavelle Posey, NP

## 2023-11-06 ENCOUNTER — Other Ambulatory Visit: Payer: Self-pay | Admitting: Nurse Practitioner

## 2023-11-07 NOTE — Telephone Encounter (Signed)
 Requested Prescriptions  Pending Prescriptions Disp Refills   meloxicam  (MOBIC ) 7.5 MG tablet [Pharmacy Med Name: MELOXICAM  7.5 MG TABLET] 90 tablet 0    Sig: TAKE 1 TABLET BY MOUTH EVERY DAY     Analgesics:  COX2 Inhibitors Failed - 11/07/2023  9:20 AM      Failed - Manual Review: Labs are only required if the patient has taken medication for more than 8 weeks.      Failed - AST in normal range and within 360 days    AST  Date Value Ref Range Status  09/29/2023 60 (H) 0 - 40 IU/L Final         Failed - ALT in normal range and within 360 days    ALT  Date Value Ref Range Status  09/29/2023 67 (H) 0 - 44 IU/L Final         Passed - HGB in normal range and within 360 days    Hemoglobin  Date Value Ref Range Status  03/28/2023 14.3 13.0 - 17.7 g/dL Final         Passed - Cr in normal range and within 360 days    Creatinine, Ser  Date Value Ref Range Status  09/29/2023 1.26 0.76 - 1.27 mg/dL Final         Passed - HCT in normal range and within 360 days    Hematocrit  Date Value Ref Range Status  03/28/2023 43.2 37.5 - 51.0 % Final         Passed - eGFR is 30 or above and within 360 days    GFR calc Af Amer  Date Value Ref Range Status  07/21/2020 85 >59 mL/min/1.73 Final    Comment:    **In accordance with recommendations from the NKF-ASN Task force,**   [facility] is in the process of updating its eGFR calculation to the   2021 CKD-EPI creatinine equation that estimates kidney function   without a race variable.    GFR calc non Af Amer  Date Value Ref Range Status  07/21/2020 74 >59 mL/min/1.73 Final   eGFR  Date Value Ref Range Status  09/29/2023 64 >59 mL/min/1.73 Final         Passed - Patient is not pregnant      Passed - Valid encounter within last 12 months    Recent Outpatient Visits           1 month ago Type 2 diabetes mellitus with obesity (HCC)    [facility] [place], Lavelle Posey, NP

## 2023-11-11 ENCOUNTER — Other Ambulatory Visit: Payer: Self-pay | Admitting: Nurse Practitioner

## 2023-11-13 NOTE — Telephone Encounter (Signed)
 Requested medications are due for refill today.  yes  Requested medications are on the active medications list.  yes  Last refill. 10/10/2023 #30 0 rf  Future visit scheduled.   yes  Notes to clinic.  Refill not delegated.    Requested Prescriptions  Pending Prescriptions Disp Refills   cyclobenzaprine  (FLEXERIL ) 10 MG tablet [Pharmacy Med Name: CYCLOBENZAPRINE  10 MG TABLET] 30 tablet 0    Sig: TAKE 1 TABLET BY MOUTH EVERY DAY AT BEDTIME AS NEEDED     Not Delegated - Analgesics:  Muscle Relaxants Failed - 11/13/2023  2:20 PM      Failed - This refill cannot be delegated      Passed - Valid encounter within last 6 months    Recent Outpatient Visits           1 month ago Type 2 diabetes mellitus with obesity (HCC)   [facility] [place], Lavelle Posey, NP

## 2023-11-22 ENCOUNTER — Other Ambulatory Visit: Payer: Self-pay | Admitting: Nurse Practitioner

## 2023-11-22 DIAGNOSIS — E1159 Type 2 diabetes mellitus with other circulatory complications: Secondary | ICD-10-CM

## 2023-11-24 NOTE — Telephone Encounter (Signed)
 Requested Prescriptions  Pending Prescriptions Disp Refills   chlorthalidone  (HYGROTON ) 25 MG tablet [Pharmacy Med Name: CHLORTHALIDONE  25 MG TABLET] 90 tablet 0    Sig: TAKE 1 TABLET (25 MG TOTAL) BY MOUTH DAILY.     Cardiovascular: Diuretics - Thiazide Passed - 11/24/2023 11:09 AM      Passed - Cr in normal range and within 180 days    Creatinine, Ser  Date Value Ref Range Status  09/29/2023 1.26 0.76 - 1.27 mg/dL Final         Passed - K in normal range and within 180 days    Potassium  Date Value Ref Range Status  09/29/2023 3.7 3.5 - 5.2 mmol/L Final         Passed - Na in normal range and within 180 days    Sodium  Date Value Ref Range Status  09/29/2023 142 134 - 144 mmol/L Final         Passed - Last BP in normal range    BP Readings from Last 1 Encounters:  09/29/23 109/67         Passed - Valid encounter within last 6 months    Recent Outpatient Visits           1 month ago Type 2 diabetes mellitus with obesity (HCC)   [place] [facility] [place], Lavelle Posey, NP

## 2023-12-12 ENCOUNTER — Other Ambulatory Visit: Payer: Self-pay | Admitting: Nurse Practitioner

## 2023-12-14 NOTE — Telephone Encounter (Signed)
 Requested medication (s) are due for refill today: yes  Requested medication (s) are on the active medication list: yes  Last refill:  11/13/23  Future visit scheduled: no  Notes to clinic:  Unable to refill per protocol, cannot delegate.      Requested Prescriptions  Pending Prescriptions Disp Refills   cyclobenzaprine  (FLEXERIL ) 10 MG tablet [Pharmacy Med Name: CYCLOBENZAPRINE  10 MG TABLET] 30 tablet 0    Sig: TAKE 1 TABLET BY MOUTH EVERY DAY AT BEDTIME AS NEEDED     Not Delegated - Analgesics:  Muscle Relaxants Failed - 12/14/2023 10:33 AM      Failed - This refill cannot be delegated      Passed - Valid encounter within last 6 months    Recent Outpatient Visits           2 months ago Type 2 diabetes mellitus with obesity (HCC)   [place] [facility] [place], Melanie DASEN, NP

## 2024-01-07 NOTE — Patient Instructions (Signed)
Be Involved in Caring For Your Health:  Taking Medications When medications are taken as directed, they can greatly improve your health. But if they are not taken as prescribed, they may not work. In some cases, not taking them correctly can be harmful. To help ensure your treatment remains effective and safe, understand your medications and how to take them. Bring your medications to each visit for review by your provider.  Your lab results, notes, and after visit summary will be available on My Chart. We strongly encourage you to use this feature. If lab results are abnormal the clinic will contact you with the appropriate steps. If the clinic does not contact you assume the results are satisfactory. You can always view your results on My Chart. If you have questions regarding your health or results, please contact the clinic during office hours. You can also ask questions on My Chart.  We at [facility] are grateful that you chose [place] to provide your care. We strive to provide evidence-based and compassionate care and are always looking for feedback. If you get a survey from the clinic please complete this so we can hear your opinions.  Diabetes Mellitus and Exercise Regular exercise is important for your health, especially if you have diabetes mellitus. Exercise is not just about losing weight. It can also help you increase muscle strength and bone density and reduce body fat and stress. This can help your level of endurance and make you more fit and flexible. Why should I exercise if I have diabetes? Exercise has many benefits for people with diabetes. It can: Help lower and control your blood sugar (glucose). Help your body respond better and become more sensitive to the hormone insulin. Reduce how much insulin your body needs. Lower your risk for heart disease by: Lowering how much "bad" cholesterol and triglycerides you have in your body. Increasing how much "good" cholesterol  you have in your body. Lowering your blood pressure. Lowering your blood glucose levels. What is my activity plan? Your health care provider or an expert trained in diabetes care (certified diabetes educator) can help you make an activity plan. This plan can help you find the type of exercise that works for you. It may also tell you how often to exercise and for how long. Be sure to: Get at least 150 minutes of medium-intensity or high-intensity exercise each week. This may involve brisk walking, biking, or water aerobics. Do stretching and strengthening exercises at least 2 times a week. This may involve yoga or weight lifting. Spread out your activity over at least 3 days of the week. Get some form of physical activity each day. Do not go more than 2 days in a row without some kind of activity. Avoid being inactive for more than 30 minutes at a time. Take frequent breaks to walk or stretch. Choose activities that you enjoy. Set goals that you know you can accomplish. Start slowly and increase the intensity of your exercise over time. How do I manage my diabetes during exercise?  Monitor your blood glucose Check your blood glucose before and after you exercise. If your blood glucose is 240 mg/dL (40.9 mmol/L) or higher before you exercise, check your urine for ketones. These are chemicals created by the liver. If you have ketones in your urine, do not exercise until your blood glucose returns to normal. If your blood glucose is 100 mg/dL (5.6 mmol/L) or lower, eat a snack that has 15-20 grams of carbohydrate in  it. Check your blood glucose 15 minutes after the snack to make sure that your level is above 100 mg/dL (5.6 mmol/L) before you start to exercise. Your risk for low blood glucose (hypoglycemia) goes up during and after exercise. Know the symptoms of this condition and how to treat it. Follow these instructions at home: Keep a carbohydrate snack on hand for use before, during, and after  exercise. This can help prevent or treat hypoglycemia. Avoid injecting insulin into parts of your body that are going to be used during exercise. This may include: Your arms, when you are going to play tennis. Your legs, when you are about to go jogging. Keep track of your exercise habits. This can help you and your health care provider watch and adjust your activity plan. Write down: What you eat before and after you exercise. Blood glucose levels before and after you exercise. The type and amount of exercise you do. Talk to your health care provider before you start a new activity. They may need to: Make sure that the activity is safe for you. Adjust your insulin, other medicines, and food that you eat. Drink water while you exercise. This can stop you from losing too much water (dehydration). It can also prevent problems caused by having a lot of heat in your body (heat stroke). Where to find more information American Diabetes Association: diabetes.org Association of Diabetes Care & Education Specialists: diabeteseducator.org This information is not intended to replace advice given to you by your health care provider. Make sure you discuss any questions you have with your health care provider. Document Revised: 11/10/2021 Document Reviewed: 11/10/2021 Elsevier Patient Education  2024 [employer].

## 2024-01-12 ENCOUNTER — Encounter: Payer: Self-pay | Admitting: Nurse Practitioner

## 2024-01-12 ENCOUNTER — Ambulatory Visit (INDEPENDENT_AMBULATORY_CARE_PROVIDER_SITE_OTHER): Admitting: Nurse Practitioner

## 2024-01-12 VITALS — BP 137/73 | HR 61 | Temp 98.5°F | Ht 71.0 in | Wt 322.2 lb

## 2024-01-12 DIAGNOSIS — E119 Type 2 diabetes mellitus without complications: Secondary | ICD-10-CM

## 2024-01-12 DIAGNOSIS — Z7984 Long term (current) use of oral hypoglycemic drugs: Secondary | ICD-10-CM

## 2024-01-12 DIAGNOSIS — I700 Atherosclerosis of aorta: Secondary | ICD-10-CM

## 2024-01-12 DIAGNOSIS — E1169 Type 2 diabetes mellitus with other specified complication: Secondary | ICD-10-CM | POA: Diagnosis not present

## 2024-01-12 DIAGNOSIS — E669 Obesity, unspecified: Secondary | ICD-10-CM

## 2024-01-12 DIAGNOSIS — E785 Hyperlipidemia, unspecified: Secondary | ICD-10-CM

## 2024-01-12 DIAGNOSIS — G4733 Obstructive sleep apnea (adult) (pediatric): Secondary | ICD-10-CM

## 2024-01-12 DIAGNOSIS — Z8673 Personal history of transient ischemic attack (TIA), and cerebral infarction without residual deficits: Secondary | ICD-10-CM

## 2024-01-12 DIAGNOSIS — E1159 Type 2 diabetes mellitus with other circulatory complications: Secondary | ICD-10-CM | POA: Diagnosis not present

## 2024-01-12 DIAGNOSIS — R7989 Other specified abnormal findings of blood chemistry: Secondary | ICD-10-CM

## 2024-01-12 DIAGNOSIS — I152 Hypertension secondary to endocrine disorders: Secondary | ICD-10-CM

## 2024-01-12 LAB — BAYER DCA HB A1C WAIVED: HB A1C (BAYER DCA - WAIVED): 6.3 % — ABNORMAL HIGH (ref 4.8–5.6)

## 2024-01-12 MED ORDER — CHLORTHALIDONE 25 MG PO TABS: 25.0000 mg | ORAL_TABLET | Freq: Every day | ORAL | 3 refills | Status: AC

## 2024-01-12 MED ORDER — ALLOPURINOL 100 MG PO TABS: 300.0000 mg | ORAL_TABLET | Freq: Every day | ORAL | 4 refills | Status: AC

## 2024-01-12 MED ORDER — AMLODIPINE BESYLATE 10 MG PO TABS: 10.0000 mg | ORAL_TABLET | Freq: Every day | ORAL | 4 refills | Status: AC

## 2024-01-12 MED ORDER — EZETIMIBE 10 MG PO TABS: 10.0000 mg | ORAL_TABLET | Freq: Every day | ORAL | 4 refills | Status: AC

## 2024-01-12 MED ORDER — ROSUVASTATIN CALCIUM 40 MG PO TABS: 40.0000 mg | ORAL_TABLET | ORAL | 4 refills | Status: AC

## 2024-01-12 MED ORDER — TELMISARTAN 80 MG PO TABS: 80.0000 mg | ORAL_TABLET | Freq: Every day | ORAL | 4 refills | Status: AC

## 2024-01-12 MED ORDER — METFORMIN HCL 500 MG PO TABS: 1000.0000 mg | ORAL_TABLET | Freq: Two times a day (BID) | ORAL | 4 refills | Status: AC

## 2024-01-12 NOTE — Assessment & Plan Note (Signed)
Chronic, stable -- continue 100% use of CPAP at home.

## 2024-01-12 NOTE — Assessment & Plan Note (Signed)
 Refer to plan of care for obesity.

## 2024-01-12 NOTE — Assessment & Plan Note (Signed)
 Chronic, stable . BP close to goal today. Continue current medication regimen and recommend DASH diet at home, lowering sodium in diet to avoid fluid retention. Adjust regimen as needed. Continue to collaborate with cardiology as needed.  Labs: CMP.  Will further consider discontinuation of Chlorthalidone  if ongoing gout flares and possibly need to start Hydralazine or Clonidine -- avoid BB due to low resting HR.  Urine ALB 06 July 2023.

## 2024-01-12 NOTE — Assessment & Plan Note (Signed)
 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 Continue statin daily and avoid ASA due to past bleed.  BP at goal today.  Check A1c, CMP, lipid Educated him on stroke prevention goals: BP <130/80 LDL <55 A1C <6.5

## 2024-01-12 NOTE — Assessment & Plan Note (Signed)
Chronic.  Noted on CXR 12/25/20.  Recommend continuation of statin daily, avoid ASA at this time due to brain hemorrhage in past.

## 2024-01-12 NOTE — Progress Notes (Signed)
 BP 137/73   Pulse 61   Temp 98.5 F (36.9 C) (Oral)   Ht 5' 11 (1.803 m)   Wt (!) 322 lb 3.2 oz (146.1 kg)   SpO2 94%   BMI 44.94 kg/m    Subjective:    Patient ID: Paul Cannon, male    DOB: 1960/12/12, 63 y.o.   MRN: [id number]  HPI: Paul Cannon is a 63 y.o. male  Chief Complaint  Patient presents with   Diabetes    No recent eye exam per patient   Hyperlipidemia   Hypertension   DIABETES In April A1c 6.7%. Continues to take Metformin  1000 MG BID. Hypoglycemic episodes:no Polydipsia/polyuria: no Visual disturbance: no Chest pain: no Paresthesias: no Glucose Monitoring: yes             Accucheck frequency: once or twice a week             Fasting glucose: 130 to 150             Post prandial:             Evening:              Before meals:  Taking Insulin?: no             Long acting insulin:             Short acting insulin: Blood Pressure Monitoring: rarely Retinal Examination: Not up to Date -- Dr. Sharlot Harpin [place] Foot Exam: Up to Date Diabetic Education: Not Completed Pneumovax: Not up to Date -- wishes to wait to 65 Influenza: Up to Date Aspirin : no    HYPERTENSION / HYPERLIPIDEMIA Taking Chlorthalidone , Micardis , Amlodipine , Zetia , and Crestor  taking  every other day, OSA present, uses CPAP 100%.  History: Intraparenchymal hemorrhage diagnosed 11/03/20.  MRI on 03/05/21 -- noted continued evolution and regression of right frontal parenchymal hemorrhage. Neuro last seen 11/28/20 and oncology 03/12/21 -- he was cleared from oncology. Cardiology last seen 10/17/2019 to return as needed only.  History of CVA, lacunar infarcts.  Had past MYO screen, normal with EF 55-65%.  Satisfied with current treatment? yes Duration of hypertension: chronic BP monitoring frequency: rarely BP range:  BP medication side effects: no Duration of hyperlipidemia: chronic Cholesterol medication side effects: no Cholesterol supplements: none Medication  compliance: good compliance Aspirin : yes Recent stressors: no Recurrent headaches: no Visual changes: no Palpitations: no Dyspnea: no Chest pain: no Lower extremity edema: no Dizzy/lightheaded: no  The ASCVD Risk score (Arnett DK, et al., 2019) failed to calculate for the following reasons:   Risk score cannot be calculated because patient has a medical history suggesting prior/existing ASCVD     01/12/2024   10:27 AM 06/28/2023   10:13 AM 03/28/2023   10:04 AM 09/26/2022   10:31 AM 03/25/2022   10:14 AM  Depression screen PHQ 2/9  Decreased Interest 0 0 0 0 0  Down, Depressed, Hopeless 0 0 0 0 0  PHQ - 2 Score 0 0 0 0 0  Altered sleeping 0 0 0 0 0  Tired, decreased energy 1 0 0 0 0  Change in appetite 0 0 0 0 0  Feeling bad or failure about yourself  0 0 0 0 0  Trouble concentrating 0 0 0 0 0  Moving slowly or fidgety/restless 0 0 0 0 0  Suicidal thoughts 0 0 0 0 0  PHQ-9 Score 1 0 0 0 0  Difficult doing work/chores Not difficult at all Not  difficult at all Not difficult at all Not difficult at all Not difficult at all       01/12/2024   10:28 AM 06/28/2023   10:13 AM 03/28/2023   10:05 AM 09/26/2022   10:31 AM  GAD 7 : Generalized Anxiety Score  Nervous, Anxious, on Edge 0 0 0 0  Control/stop worrying 0 0 0 0  Worry too much - different things 1 0 0 0  Trouble relaxing 0 0 0 0  Restless 0 0 0 0  Easily annoyed or irritable 1 0 0 0  Afraid - awful might happen 0 0 0 0  Total GAD 7 Score 2 0 0 0  Anxiety Difficulty Not difficult at all Not difficult at all Not difficult at all Not difficult at all   Relevant past medical, surgical, family and social history reviewed and updated as indicated. Interim medical history since our last visit reviewed. Allergies and medications reviewed and updated.  Review of Systems  Constitutional:  Negative for activity change, diaphoresis, fatigue and fever.  Respiratory:  Negative for cough, chest tightness, shortness of breath and  wheezing.   Cardiovascular:  Negative for chest pain, palpitations and leg swelling.  Gastrointestinal: Negative.   Neurological: Negative.   Psychiatric/Behavioral: Negative.      Per HPI unless specifically indicated above     Objective:    BP 137/73   Pulse 61   Temp 98.5 F (36.9 C) (Oral)   Ht 5' 11 (1.803 m)   Wt (!) 322 lb 3.2 oz (146.1 kg)   SpO2 94%   BMI 44.94 kg/m   Wt Readings from Last 3 Encounters:  01/12/24 (!) 322 lb 3.2 oz (146.1 kg)  09/29/23 (!) 320 lb 3.2 oz (145.2 kg)  06/28/23 (!) 317 lb 3.2 oz (143.9 kg)    Physical Exam Vitals and nursing note reviewed.  Constitutional:      General: He is awake. He is not in acute distress.    Appearance: He is well-developed and well-groomed. He is obese. He is not ill-appearing or toxic-appearing.  HENT:     Head: Normocephalic and atraumatic.     Right Ear: Hearing normal. No drainage.     Left Ear: Hearing normal. No drainage.  Eyes:     General: Lids are normal.        Right eye: No discharge.        Left eye: No discharge.     Conjunctiva/sclera: Conjunctivae normal.     Pupils: Pupils are equal, round, and reactive to light.  Neck:     Thyroid : No thyromegaly.     Vascular: No carotid bruit.  Cardiovascular:     Rate and Rhythm: Normal rate and regular rhythm.     Heart sounds: Normal heart sounds, S1 normal and S2 normal. No murmur heard.    No gallop.  Pulmonary:     Effort: Pulmonary effort is normal. No accessory muscle usage or respiratory distress.     Breath sounds: Normal breath sounds.  Abdominal:     General: Bowel sounds are normal. There is no distension.     Palpations: Abdomen is soft.     Tenderness: There is no abdominal tenderness.  Musculoskeletal:        General: Normal range of motion.     Cervical back: Normal range of motion and neck supple.     Right lower leg: No edema.     Left lower leg: No edema.  Skin:  General: Skin is warm and dry.     Capillary Refill:  Capillary refill takes less than 2 seconds.     Findings: No rash.  Neurological:     Mental Status: He is alert and oriented to person, place, and time.     Gait: Gait is intact.     Deep Tendon Reflexes: Reflexes are normal and symmetric.     Reflex Scores:      Brachioradialis reflexes are 2+ on the right side and 2+ on the left side.      Patellar reflexes are 2+ on the right side and 2+ on the left side. Psychiatric:        Attention and Perception: Attention normal.        Mood and Affect: Mood normal.        Speech: Speech normal.        Behavior: Behavior normal. Behavior is cooperative.        Thought Content: Thought content normal.    Diabetic Foot Exam - Simple   Simple Foot Form Visual Inspection See comments: Yes Sensation Testing Intact to touch and monofilament testing bilaterally: Yes Pulse Check Posterior Tibialis and Dorsalis pulse intact bilaterally: Yes Comments Dry skin     Results for orders placed or performed in visit on 09/29/23  Bayer DCA Hb A1c Waived   Collection Time: 09/29/23 10:50 AM  Result Value Ref Range   HB A1C (BAYER DCA - WAIVED) 6.7 (H) 4.8 - 5.6 %  Comprehensive metabolic panel with GFR   Collection Time: 09/29/23 10:50 AM  Result Value Ref Range   Glucose 163 (H) 70 - 99 mg/dL   BUN 20 8 - 27 mg/dL   Creatinine, Ser 8.73 0.76 - 1.27 mg/dL   eGFR 64 >40 fO/fpw/8.26   BUN/Creatinine Ratio 16 10 - 24   Sodium 142 134 - 144 mmol/L   Potassium 3.7 3.5 - 5.2 mmol/L   Chloride 104 96 - 106 mmol/L   CO2 19 (L) 20 - 29 mmol/L   Calcium  9.4 8.6 - 10.2 mg/dL   Total Protein 7.0 6.0 - 8.5 g/dL   Albumin 4.3 3.9 - 4.9 g/dL   Globulin, Total 2.7 1.5 - 4.5 g/dL   Bilirubin Total 0.3 0.0 - 1.2 mg/dL   Alkaline Phosphatase 70 44 - 121 IU/L   AST 60 (H) 0 - 40 IU/L   ALT 67 (H) 0 - 44 IU/L  Lipid Panel w/o Chol/HDL Ratio   Collection Time: 09/29/23 10:50 AM  Result Value Ref Range   Cholesterol, Total 97 (L) 100 - 199 mg/dL    Triglycerides 856 0 - 149 mg/dL   HDL 35 (L) >60 mg/dL   VLDL Cholesterol Cal 25 5 - 40 mg/dL   LDL Chol Calc (NIH) 37 0 - 99 mg/dL      Assessment & Plan:   Problem List Items Addressed This Visit       Cardiovascular and Mediastinum   Hypertension associated with diabetes (HCC)   Chronic, stable . BP close to goal today. Continue current medication regimen and recommend DASH diet at home, lowering sodium in diet to avoid fluid retention. Adjust regimen as needed. Continue to collaborate with cardiology as needed.  Labs: CMP.  Will further consider discontinuation of Chlorthalidone  if ongoing gout flares and possibly need to start Hydralazine or Clonidine -- avoid BB due to low resting HR.  Urine ALB 06 July 2023.      Relevant Medications   chlorthalidone  (HYGROTON )  25 MG tablet   amLODipine  (NORVASC ) 10 MG tablet   ezetimibe  (ZETIA ) 10 MG tablet   metFORMIN  (GLUCOPHAGE ) 500 MG tablet   rosuvastatin  (CRESTOR ) 40 MG tablet   telmisartan  (MICARDIS ) 80 MG tablet   Other Relevant Orders   Bayer DCA Hb A1c Waived   Aortic atherosclerosis (HCC)   Chronic.  Noted on CXR 12/25/20.  Recommend continuation of statin daily, avoid ASA at this time due to brain hemorrhage in past.      Relevant Medications   chlorthalidone  (HYGROTON ) 25 MG tablet   amLODipine  (NORVASC ) 10 MG tablet   ezetimibe  (ZETIA ) 10 MG tablet   rosuvastatin  (CRESTOR ) 40 MG tablet   telmisartan  (MICARDIS ) 80 MG tablet   Other Relevant Orders   Comprehensive metabolic panel with GFR   Lipid Panel w/o Chol/HDL Ratio     Respiratory   OSA on CPAP   Chronic, stable -- continue 100% use of CPAP at home.          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  A1c today 6.3%.  Urine ALB 06 July 2023. Discussed options to include continue Metformin  or stop and start GLP1, SGLT2, DPP4.  Will continue Metformin .  Educated him on  side effects and how medication works.  Offered referral to diabetes education, he declined at this time.   - Continue Metformin  1000 MG BID.  Recent kidney function stable. - Check sugars TID  - Continue ARB and statin therapy - Foot exam up to date.  Eye exam will obtain.      Relevant Medications   metFORMIN  (GLUCOPHAGE ) 500 MG tablet   rosuvastatin  (CRESTOR ) 40 MG tablet   telmisartan  (MICARDIS ) 80 MG tablet   Other Relevant Orders   Bayer DCA Hb A1c Waived   Hyperlipidemia associated with type 2 diabetes mellitus (HCC)   Chronic, ongoing.  Continue current medication regimen and adjust as needed -- is on every other day Crestor  due to myalgia with increased doses + continue Zetia  daily  Will plan on repeat CMP and lipids. Educated him on stroke prevention goals: BP <130/80 LDL <55 A1C <6.5      Relevant Medications   chlorthalidone  (HYGROTON ) 25 MG tablet   amLODipine  (NORVASC ) 10 MG tablet   ezetimibe  (ZETIA ) 10 MG tablet   metFORMIN  (GLUCOPHAGE ) 500 MG tablet   rosuvastatin  (CRESTOR ) 40 MG tablet   telmisartan  (MICARDIS ) 80 MG tablet   Other Relevant Orders   Bayer DCA Hb A1c Waived   Comprehensive metabolic panel with GFR   Lipid Panel w/o Chol/HDL Ratio   Diabetes mellitus treated with oral medication (HCC)   Refer to plan of care for obesity.      Relevant Medications   metFORMIN  (GLUCOPHAGE ) 500 MG tablet   rosuvastatin  (CRESTOR ) 40 MG tablet   telmisartan  (MICARDIS ) 80 MG tablet   Other Relevant Orders   Bayer DCA Hb A1c Waived     Other   Morbid obesity (HCC)   BMI 44.94 with HTN/HLD, T2DM.  Recommended eating smaller high protein, low fat meals more frequently and exercising 30 mins a day 5 times a week with a goal of 10-15lb weight loss in the next 3 months. Patient voiced their understanding and motivation to adhere to these recommendations.       Relevant Medications   metFORMIN  (GLUCOPHAGE ) 500 MG tablet   History of stroke   In August 2020 and  intraparenchymal bleed June 2022. Continue statin daily and avoid ASA due to past bleed.  BP at goal today.  Check A1c, CMP, lipid Educated him on stroke prevention goals: BP <130/80 LDL <55 A1C <6.5       Elevated LFTs   Recheck CMP today. Imaging as needed.      Relevant Orders   Comprehensive metabolic panel with GFR     Follow up plan: Return in about 3 months (around 04/13/2024) for Annual Physical after 03/27/24.

## 2024-01-12 NOTE — Assessment & Plan Note (Signed)
 Diagnosed initially April 2024 and wanted to maintain diet focus.  A1c today 6.3%.  Urine ALB 06 July 2023. Discussed options to include continue Metformin  or stop and start GLP1, SGLT2, DPP4.  Will continue Metformin .  Educated him on side effects and how medication works.  Offered referral to diabetes education, he declined at this time.   - Continue Metformin  1000 MG BID.  Recent kidney function stable. - Check sugars TID  - Continue ARB and statin therapy - Foot exam up to date.  Eye exam will obtain.

## 2024-01-12 NOTE — Assessment & Plan Note (Signed)
 Recheck CMP today. Imaging as needed.

## 2024-01-12 NOTE — Assessment & Plan Note (Signed)
 BMI 44.94 with HTN/HLD, T2DM.  Recommended eating smaller high protein, low fat meals more frequently and exercising 30 mins a day 5 times a week with a goal of 10-15lb weight loss in the next 3 months. Patient voiced their understanding and motivation to adhere to these recommendations.

## 2024-01-12 NOTE — Assessment & Plan Note (Signed)
 Chronic, ongoing.  Continue current medication regimen and adjust as needed -- is on every other day Crestor due to myalgia with increased doses + continue Zetia daily  Will plan on repeat CMP and lipids. Educated him on stroke prevention goals: BP <130/80 LDL <55 A1C <6.5

## 2024-01-13 ENCOUNTER — Ambulatory Visit: Payer: Self-pay | Admitting: Nurse Practitioner

## 2024-01-13 LAB — COMPREHENSIVE METABOLIC PANEL WITH GFR
ALT: 37 IU/L (ref 0–44)
AST: 42 IU/L — ABNORMAL HIGH (ref 0–40)
Albumin: 4.1 g/dL (ref 3.9–4.9)
Alkaline Phosphatase: 65 IU/L (ref 44–121)
BUN/Creatinine Ratio: 11 (ref 10–24)
BUN: 11 mg/dL (ref 8–27)
Bilirubin Total: 0.4 mg/dL (ref 0.0–1.2)
CO2: 20 mmol/L (ref 20–29)
Calcium: 8.7 mg/dL (ref 8.6–10.2)
Chloride: 105 mmol/L (ref 96–106)
Creatinine, Ser: 0.99 mg/dL (ref 0.76–1.27)
Globulin, Total: 2.4 g/dL (ref 1.5–4.5)
Glucose: 125 mg/dL — ABNORMAL HIGH (ref 70–99)
Potassium: 3.9 mmol/L (ref 3.5–5.2)
Sodium: 141 mmol/L (ref 134–144)
Total Protein: 6.5 g/dL (ref 6.0–8.5)
eGFR: 86 mL/min/1.73 (ref >59)

## 2024-01-13 LAB — LIPID PANEL W/O CHOL/HDL RATIO
Cholesterol, Total: 101 mg/dL (ref 100–199)
HDL: 37 mg/dL — ABNORMAL LOW (ref >39)
LDL Chol Calc (NIH): 39 mg/dL (ref 0–99)
Triglycerides: 145 mg/dL (ref 0–149)
VLDL Cholesterol Cal: 25 mg/dL (ref 5–40)

## 2024-01-13 NOTE — Progress Notes (Signed)
 Contacted via MyChart  Good morning [place], your labs have returned and overall remain stable.  No medication changes needed.  Great job!!  Any questions? Keep being amazing!!  Thank you for allowing me to participate in your care.  I appreciate you. Kindest regards, Gresia Isidoro

## 2024-01-14 ENCOUNTER — Other Ambulatory Visit: Payer: Self-pay | Admitting: Nurse Practitioner

## 2024-01-17 NOTE — Telephone Encounter (Signed)
 Requested medication (s) are due for refill today: yes  Requested medication (s) are on the active medication list: yes  Last refill:  12/14/23  Future visit scheduled: yes  Notes to clinic:  Unable to refill per protocol, cannot delegate.      Requested Prescriptions  Pending Prescriptions Disp Refills   cyclobenzaprine  (FLEXERIL ) 10 MG tablet [Pharmacy Med Name: CYCLOBENZAPRINE  10 MG TABLET] 30 tablet 0    Sig: TAKE 1 TABLET BY MOUTH EVERY DAY AT BEDTIME AS NEEDED     Not Delegated - Analgesics:  Muscle Relaxants Failed - 01/17/2024  2:12 PM      Failed - This refill cannot be delegated      Passed - Valid encounter within last 6 months    Recent Outpatient Visits           5 days ago Type 2 diabetes mellitus with obesity (HCC)   [place] [facility] [place], [place] T, NP   3 months ago Type 2 diabetes mellitus with obesity (HCC)   [place] [facility] [place], Melanie DASEN, NP

## 2024-02-04 ENCOUNTER — Other Ambulatory Visit: Payer: Self-pay | Admitting: Nurse Practitioner

## 2024-02-06 NOTE — Telephone Encounter (Signed)
 Requested Prescriptions  Pending Prescriptions Disp Refills   meloxicam  (MOBIC ) 7.5 MG tablet [Pharmacy Med Name: MELOXICAM  7.5 MG TABLET] 90 tablet 0    Sig: TAKE 1 TABLET BY MOUTH EVERY DAY     Analgesics:  COX2 Inhibitors Failed - 02/06/2024 12:07 PM      Failed - Manual Review: Labs are only required if the patient has taken medication for more than 8 weeks.      Failed - AST in normal range and within 360 days    AST  Date Value Ref Range Status  01/12/2024 42 (H) 0 - 40 IU/L Final         Passed - HGB in normal range and within 360 days    Hemoglobin  Date Value Ref Range Status  03/28/2023 14.3 13.0 - 17.7 g/dL Final         Passed - Cr in normal range and within 360 days    Creatinine, Ser  Date Value Ref Range Status  01/12/2024 0.99 0.76 - 1.27 mg/dL Final         Passed - HCT in normal range and within 360 days    Hematocrit  Date Value Ref Range Status  03/28/2023 43.2 37.5 - 51.0 % Final         Passed - ALT in normal range and within 360 days    ALT  Date Value Ref Range Status  01/12/2024 37 0 - 44 IU/L Final         Passed - eGFR is 30 or above and within 360 days    GFR calc Af Amer  Date Value Ref Range Status  07/21/2020 85 >59 mL/min/1.73 Final    Comment:    **In accordance with recommendations from the NKF-ASN Task force,**   [facility] is in the process of updating its eGFR calculation to the   2021 CKD-EPI creatinine equation that estimates kidney function   without a race variable.    GFR calc non Af Amer  Date Value Ref Range Status  07/21/2020 74 >59 mL/min/1.73 Final   eGFR  Date Value Ref Range Status  01/12/2024 86 >59 mL/min/1.73 Final         Passed - Patient is not pregnant      Passed - Valid encounter within last 12 months    Recent Outpatient Visits           3 weeks ago Type 2 diabetes mellitus with obesity (HCC)   [place] [facility] [place], [place] T, NP   4 months ago Type 2 diabetes mellitus with  obesity (HCC)   [place] [facility] [place], Melanie DASEN, NP

## 2024-02-15 ENCOUNTER — Other Ambulatory Visit: Payer: Self-pay | Admitting: Nurse Practitioner

## 2024-02-15 NOTE — Telephone Encounter (Signed)
 Requested medications are due for refill today.  yes  Requested medications are on the active medications list.  yes  Last refill. 01/17/2024 #30 0 rf  Future visit scheduled.   yes  Notes to clinic.  Refill not delegated.    Requested Prescriptions  Pending Prescriptions Disp Refills   cyclobenzaprine  (FLEXERIL ) 10 MG tablet [Pharmacy Med Name: CYCLOBENZAPRINE  10 MG TABLET] 30 tablet 0    Sig: TAKE 1 TABLET BY MOUTH EVERY DAY AT BEDTIME AS NEEDED     Not Delegated - Analgesics:  Muscle Relaxants Failed - 02/15/2024  4:30 PM      Failed - This refill cannot be delegated      Passed - Valid encounter within last 6 months    Recent Outpatient Visits           1 month ago Type 2 diabetes mellitus with obesity (HCC)   [place] [facility] [place], [place] T, NP   4 months ago Type 2 diabetes mellitus with obesity (HCC)   [place] [facility] [place], Melanie DASEN, NP

## 2024-04-02 LAB — OPHTHALMOLOGY REPORT-SCANNED

## 2024-04-14 NOTE — Patient Instructions (Signed)

## 2024-04-19 ENCOUNTER — Encounter: Payer: Self-pay | Admitting: Nurse Practitioner

## 2024-04-19 ENCOUNTER — Ambulatory Visit (INDEPENDENT_AMBULATORY_CARE_PROVIDER_SITE_OTHER): Admitting: Nurse Practitioner

## 2024-04-19 VITALS — BP 116/64 | HR 59 | Temp 97.5°F | Ht 71.65 in | Wt 320.8 lb

## 2024-04-19 DIAGNOSIS — E669 Obesity, unspecified: Secondary | ICD-10-CM

## 2024-04-19 DIAGNOSIS — Z8673 Personal history of transient ischemic attack (TIA), and cerebral infarction without residual deficits: Secondary | ICD-10-CM

## 2024-04-19 DIAGNOSIS — M1A0720 Idiopathic chronic gout, left ankle and foot, without tophus (tophi): Secondary | ICD-10-CM

## 2024-04-19 DIAGNOSIS — Z0000 Encounter for general adult medical examination without abnormal findings: Secondary | ICD-10-CM

## 2024-04-19 DIAGNOSIS — G4733 Obstructive sleep apnea (adult) (pediatric): Secondary | ICD-10-CM

## 2024-04-19 DIAGNOSIS — E119 Type 2 diabetes mellitus without complications: Secondary | ICD-10-CM

## 2024-04-19 DIAGNOSIS — I152 Hypertension secondary to endocrine disorders: Secondary | ICD-10-CM

## 2024-04-19 DIAGNOSIS — E1169 Type 2 diabetes mellitus with other specified complication: Secondary | ICD-10-CM

## 2024-04-19 DIAGNOSIS — Z23 Encounter for immunization: Secondary | ICD-10-CM

## 2024-04-19 DIAGNOSIS — N400 Enlarged prostate without lower urinary tract symptoms: Secondary | ICD-10-CM

## 2024-04-19 LAB — BAYER DCA HB A1C WAIVED: HB A1C (BAYER DCA - WAIVED): 6.5 % — ABNORMAL HIGH (ref 4.8–5.6)

## 2024-04-19 NOTE — Progress Notes (Signed)
 BP 116/64 (BP Location: Left Arm, Patient Position: Sitting, Cuff Size: Large)   Pulse (!) 59   Temp (!) 97.5 F (36.4 C)   Ht 5' 11.65 (1.82 m)   Wt (!) 320 lb 12.8 oz (145.5 kg)   SpO2 96%   BMI 43.93 kg/m    Subjective:    Patient ID: Paul Cannon, male    DOB: 22-Dec-1960, 63 y.o.   MRN: [id number]  HPI: Paul Cannon is a 63 y.o. male presenting on 04/19/2024 for comprehensive medical examination. Current medical complaints include:none  He currently lives with: wife Interim Problems from his last visit: no  DIABETES August A1c 6.3%.  Currently takes Metformin  1000 MG BID.  Continues to use Gabapentin  only as needed. Hypoglycemic episodes:no Polydipsia/polyuria: no Visual disturbance: no Chest pain: no Paresthesias: no Glucose Monitoring: yes             Accucheck frequency: occasional -- last check was 137, often 115 to 140             Fasting glucose:             Post prandial:              Evening:              Before meals:  Taking Insulin?: no             Long acting insulin:             Short acting insulin: Blood Pressure Monitoring: rarely Retinal Examination: Up to Date -- Dr. Sharlot Harpin in [place] Foot Exam: Up to Date Diabetic Education: Not Completed Pneumovax: Not up to Date Influenza: Up to Date Aspirin : no    HYPERTENSION / HYPERLIPIDEMIA Takes Chlorthalidone , Micardis , Amlodipine , Zetia , and Crestor  (takes this every other day). Uses CPAP nightly.  Takes Allopurinol  for gout, has not had a flare in quite awhile.  Always in an ankle.  Takes Colchicine  as needed.   History: Intraparenchymal hemorrhage diagnosed 11/03/20.  MRI on 03/05/21 -- noted continued evolution and regression of right frontal parenchymal hemorrhage. Neuro last seen 11/28/20 and oncology 03/12/21 -- he was cleared from oncology. Cardiology last seen 10/17/2019 to return as needed only.  History of CVA, lacunar infarcts.  Had past MYO screen, normal with EF 55-65%.   Satisfied with current treatment? yes Duration of hypertension: chronic BP monitoring frequency: rarely BP range:  BP medication side effects: no Duration of hyperlipidemia: chronic Cholesterol medication side effects: no Cholesterol supplements: none Medication compliance: good compliance Aspirin : yes Recent stressors: no Recurrent headaches: no Visual changes: no Palpitations: no Dyspnea: no Chest pain: no Lower extremity edema: no Dizzy/lightheaded: no  The ASCVD Risk score (Arnett DK, et al., 2019) failed to calculate for the following reasons:   Risk score cannot be calculated because patient has a medical history suggesting prior/existing ASCVD  Functional Status Survey: Is the patient deaf or have difficulty hearing?: No Does the patient have difficulty seeing, even when wearing glasses/contacts?: No Does the patient have difficulty concentrating, remembering, or making decisions?: No Does the patient have difficulty walking or climbing stairs?: No Does the patient have difficulty dressing or bathing?: No Does the patient have difficulty doing errands alone such as visiting a doctor's office or shopping?: No  FALL RISK:    04/19/2024   10:21 AM 01/12/2024   10:26 AM 06/28/2023   10:12 AM 03/28/2023   10:04 AM 09/26/2022   10:30 AM  Fall Risk   Falls  in the past year? 0 0 0 0 0  Number falls in past yr: 0 0 0 0 0  Injury with Fall? 0 0 0 0 0  Risk for fall due to : No Fall Risks No Fall Risks No Fall Risks No Fall Risks No Fall Risks  Follow up Falls evaluation completed Falls evaluation completed Falls evaluation completed Falls evaluation completed Falls evaluation completed   Depression Screen    04/19/2024   10:21 AM 01/12/2024   10:27 AM 06/28/2023   10:13 AM 03/28/2023   10:04 AM 09/26/2022   10:31 AM  Depression screen PHQ 2/9  Decreased Interest 0 0 0 0 0  Down, Depressed, Hopeless 0 0 0 0 0  PHQ - 2 Score 0 0 0 0 0  Altered sleeping 0 0 0 0 0  Tired,  decreased energy 0 1 0 0 0  Change in appetite 0 0 0 0 0  Feeling bad or failure about yourself  0 0 0 0 0  Trouble concentrating 0 0 0 0 0  Moving slowly or fidgety/restless 0 0 0 0 0  Suicidal thoughts 0 0 0 0 0  PHQ-9 Score 0 1  0  0  0   Difficult doing work/chores Not difficult at all Not difficult at all Not difficult at all Not difficult at all Not difficult at all     Data saved with a previous flowsheet row definition      04/19/2024   10:21 AM 01/12/2024   10:28 AM 06/28/2023   10:13 AM 03/28/2023   10:05 AM  GAD 7 : Generalized Anxiety Score  Nervous, Anxious, on Edge 0 0 0 0  Control/stop worrying 0 0 0 0  Worry too much - different things 0 1 0 0  Trouble relaxing 0 0 0 0  Restless 0 0 0 0  Easily annoyed or irritable 0 1 0 0  Afraid - awful might happen 0 0 0 0  Total GAD 7 Score 0 2 0 0  Anxiety Difficulty Not difficult at all Not difficult at all Not difficult at all Not difficult at all   Past Medical History:  Past Medical History:  Diagnosis Date   Arthritis 01/2014   Brain mass    Diabetes mellitus without complication (HCC)    GERD (gastroesophageal reflux disease)    Gout 2022   Hyperlipidemia    Hypertension    Obesity    Sleep apnea    Stroke [facility]) August 2020   TIA   Surgical History:  Past Surgical History:  Procedure Laterality Date   COLONOSCOPY WITH PROPOFOL  N/A 11/17/2015   Procedure: COLONOSCOPY WITH PROPOFOL ;  Surgeon: Rogelia Copping, MD;  Location: [facility] ENDOSCOPY;  Service: Endoscopy;  Laterality: N/A;   DENTAL SURGERY  05/2021   SHOULDER SURGERY     Medications:  Current Outpatient Medications on File Prior to Visit  Medication Sig   allopurinol  (ZYLOPRIM ) 100 MG tablet Take 3 tablets (300 mg total) by mouth daily.   amLODipine  (NORVASC ) 10 MG tablet Take 1 tablet (10 mg total) by mouth daily.   Blood Glucose Monitoring Suppl (ONETOUCH VERIO) w/Device KIT Use to check blood sugar 3 times a day and document results, bring to  appointments.  Goal is <130 fasting blood sugar and <180 two hours after meals.   chlorthalidone  (HYGROTON ) 25 MG tablet Take 1 tablet (25 mg total) by mouth daily.   colchicine  0.6 MG tablet TAKE 1 TABLET (0.6 MG TOTAL) BY MOUTH  DAILY. USE ONLY WHEN ACUTE FLARE OF GOUT PRESENT.   cyclobenzaprine  (FLEXERIL ) 10 MG tablet TAKE 1 TABLET BY MOUTH EVERY DAY AT BEDTIME AS NEEDED   ezetimibe  (ZETIA ) 10 MG tablet Take 1 tablet (10 mg total) by mouth daily.   famotidine  (PEPCID ) 40 MG tablet Take 1 tablet (40 mg total) by mouth daily as needed for heartburn or indigestion.   glucose blood (ONETOUCH VERIO) test strip Use as instructed   Lancets (ONETOUCH DELICA PLUS LANCET33G) MISC Apply 1 Lancet topically as directed.   meloxicam  (MOBIC ) 7.5 MG tablet TAKE 1 TABLET BY MOUTH EVERY DAY   metFORMIN  (GLUCOPHAGE ) 500 MG tablet Take 2 tablets (1,000 mg total) by mouth 2 (two) times daily with a meal.   Multiple Vitamins-Minerals (ONE-A-DAY MENS 50+ ADVANTAGE PO) Take 1 tablet by mouth daily.   rosuvastatin  (CRESTOR ) 40 MG tablet Take 1 tablet (40 mg total) by mouth every other day.   sildenafil  (VIAGRA ) 100 MG tablet Take 0.5-1 tablets (50-100 mg total) by mouth daily as needed for erectile dysfunction.   telmisartan  (MICARDIS ) 80 MG tablet Take 1 tablet (80 mg total) by mouth daily.   No current facility-administered medications on file prior to visit.    Allergies:  Allergies  Allergen Reactions   Percocet [Oxycodone-Acetaminophen] Diarrhea and Nausea And Vomiting   Vicodin [Hydrocodone-Acetaminophen] Diarrhea and Nausea And Vomiting   Social History:  Social History   Socioeconomic History   Marital status: Married    Spouse name: Not on file   Number of children: Not on file   Years of education: Not on file   Highest education level: Some college, no degree  Occupational History   Not on file  Tobacco Use   Smoking status: Former    Current packs/day: 0.00    Types: Cigarettes    Quit  date: 10/01/2010    Years since quitting: 13.5   Smokeless tobacco: Former   Tobacco comments:    Quit 13 years ago  Vaping Use   Vaping status: Never Used  Substance and Sexual Activity   Alcohol use: Yes    Comment: pt states occasional or very rarely   Drug use: No   Sexual activity: Yes    Birth control/protection: None  Other Topics Concern   Not on file  Social History Narrative   Not on file   Social Drivers of Health   Financial Resource Strain: Low Risk  (04/15/2024)   Overall Financial Resource Strain (CARDIA)    Difficulty of Paying Living Expenses: Not hard at all  Food Insecurity: No Food Insecurity (04/15/2024)   Hunger Vital Sign    Worried About Running Out of Food in the Last Year: Never true    Ran Out of Food in the Last Year: Never true  Transportation Needs: No Transportation Needs (04/15/2024)   PRAPARE - Administrator, Civil Service (Medical): No    Lack of Transportation (Non-Medical): No  Physical Activity: Insufficiently Active (04/15/2024)   Exercise Vital Sign    Days of Exercise per Week: 2 days    Minutes of Exercise per Session: 10 min  Stress: No Stress Concern Present (04/15/2024)   [employer] of Occupational Health - Occupational Stress Questionnaire    Feeling of Stress: Not at all  Social Connections: Socially Isolated (04/15/2024)   Social Connection and Isolation Panel    Frequency of Communication with Friends and Family: Once a week    Frequency of Social Gatherings with Friends and Family:  Once a week    Attends Religious Services: Never    Active Member of Clubs or Organizations: No    Attends Banker Meetings: Not on file    Marital Status: Married  Catering Manager Violence: Not At Risk (04/19/2024)   Humiliation, Afraid, Rape, and Kick questionnaire    Fear of Current or Ex-Partner: No    Emotionally Abused: No    Physically Abused: No    Sexually Abused: No   Social History   Tobacco  Use  Smoking Status Former   Current packs/day: 0.00   Types: Cigarettes   Quit date: 10/01/2010   Years since quitting: 13.5  Smokeless Tobacco Former  Tobacco Comments   Quit 13 years ago   Social History   Substance and Sexual Activity  Alcohol Use Yes   Comment: pt states occasional or very rarely    Family History:  Family History  Problem Relation Age of Onset   Arthritis Mother    Hyperlipidemia Mother    Hypertension Mother    Migraines Mother    Miscarriages / Stillbirths Mother    Varicose Veins Mother    Heart disease Father        MI   Stroke Father    Lung disease Father    Cancer Sister        breast   Cancer Brother        melanoma   Alcohol abuse Brother    Stroke Sister     Past medical history, surgical history, medications, allergies, family history and social history reviewed with patient today and changes made to appropriate areas of the chart.   ROS All other ROS negative except what is listed above and in the HPI.      Objective:    BP 116/64 (BP Location: Left Arm, Patient Position: Sitting, Cuff Size: Large)   Pulse (!) 59   Temp (!) 97.5 F (36.4 C)   Ht 5' 11.65 (1.82 m)   Wt (!) 320 lb 12.8 oz (145.5 kg)   SpO2 96%   BMI 43.93 kg/m   Wt Readings from Last 3 Encounters:  04/19/24 (!) 320 lb 12.8 oz (145.5 kg)  01/12/24 (!) 322 lb 3.2 oz (146.1 kg)  09/29/23 (!) 320 lb 3.2 oz (145.2 kg)     Physical Exam Vitals and nursing note reviewed.  Constitutional:      General: He is awake. He is not in acute distress.    Appearance: He is well-developed and well-groomed. He is obese. He is not ill-appearing or toxic-appearing.  HENT:     Head: Normocephalic and atraumatic.     Right Ear: Hearing, tympanic membrane, ear canal and external ear normal. No drainage.     Left Ear: Hearing, tympanic membrane, ear canal and external ear normal. No drainage.     Nose: Nose normal.     Mouth/Throat:     Pharynx: Uvula midline.  Eyes:      General: Lids are normal.        Right eye: No discharge.        Left eye: No discharge.     Extraocular Movements: Extraocular movements intact.     Conjunctiva/sclera: Conjunctivae normal.     Pupils: Pupils are equal, round, and reactive to light.     Visual Fields: Right eye visual fields normal and left eye visual fields normal.  Neck:     Thyroid : No thyromegaly.     Vascular: No carotid bruit or  JVD.     Trachea: Trachea normal.  Cardiovascular:     Rate and Rhythm: Regular rhythm. Bradycardia present.     Heart sounds: Normal heart sounds, S1 normal and S2 normal. No murmur heard.    No gallop.  Pulmonary:     Effort: Pulmonary effort is normal. No accessory muscle usage or respiratory distress.     Breath sounds: Normal breath sounds.  Abdominal:     General: Bowel sounds are normal.     Palpations: Abdomen is soft. There is no hepatomegaly or splenomegaly.     Tenderness: There is no abdominal tenderness.  Musculoskeletal:        General: Normal range of motion.     Cervical back: Normal range of motion and neck supple.     Right lower leg: No edema.     Left lower leg: No edema.  Lymphadenopathy:     Head:     Right side of head: No submental, submandibular, tonsillar, preauricular or posterior auricular adenopathy.     Left side of head: No submental, submandibular, tonsillar, preauricular or posterior auricular adenopathy.     Cervical: No cervical adenopathy.  Skin:    General: Skin is warm and dry.     Capillary Refill: Capillary refill takes less than 2 seconds.     Findings: No rash.  Neurological:     Mental Status: He is alert and oriented to person, place, and time.     Gait: Gait is intact.     Deep Tendon Reflexes: Reflexes are normal and symmetric.     Reflex Scores:      Brachioradialis reflexes are 2+ on the right side and 2+ on the left side.      Patellar reflexes are 2+ on the right side and 2+ on the left side. Psychiatric:        Attention  and Perception: Attention normal.        Mood and Affect: Mood normal.        Speech: Speech normal.        Behavior: Behavior normal. Behavior is cooperative.        Thought Content: Thought content normal.        Cognition and Memory: Cognition normal.     Results for orders placed or performed in visit on 01/12/24  Bayer DCA Hb A1c Waived   Collection Time: 01/12/24 10:27 AM  Result Value Ref Range   HB A1C (BAYER DCA - WAIVED) 6.3 (H) 4.8 - 5.6 %  Comprehensive metabolic panel with GFR   Collection Time: 01/12/24 10:27 AM  Result Value Ref Range   Glucose 125 (H) 70 - 99 mg/dL   BUN 11 8 - 27 mg/dL   Creatinine, Ser 9.00 0.76 - 1.27 mg/dL   eGFR 86 >40 fO/fpw/8.26   BUN/Creatinine Ratio 11 10 - 24   Sodium 141 134 - 144 mmol/L   Potassium 3.9 3.5 - 5.2 mmol/L   Chloride 105 96 - 106 mmol/L   CO2 20 20 - 29 mmol/L   Calcium  8.7 8.6 - 10.2 mg/dL   Total Protein 6.5 6.0 - 8.5 g/dL   Albumin 4.1 3.9 - 4.9 g/dL   Globulin, Total 2.4 1.5 - 4.5 g/dL   Bilirubin Total 0.4 0.0 - 1.2 mg/dL   Alkaline Phosphatase 65 44 - 121 IU/L   AST 42 (H) 0 - 40 IU/L   ALT 37 0 - 44 IU/L  Lipid Panel w/o Chol/HDL Ratio   Collection Time:  01/12/24 10:27 AM  Result Value Ref Range   Cholesterol, Total 101 100 - 199 mg/dL   Triglycerides 854 0 - 149 mg/dL   HDL 37 (L) >60 mg/dL   VLDL Cholesterol Cal 25 5 - 40 mg/dL   LDL Chol Calc (NIH) 39 0 - 99 mg/dL      Assessment & Plan:   Problem List Items Addressed This Visit       Cardiovascular and Mediastinum   Hypertension associated with diabetes (HCC)   Chronic, stable . BP well at goal today. Continue current medication regimen and recommend DASH diet at home, lowering sodium in diet to avoid fluid retention. Adjust regimen as needed. Continue to collaborate with cardiology as needed.  Labs: CBC, TSH, CMP.  Will further consider discontinuation of Chlorthalidone  if ongoing gout flares and possibly need to start Hydralazine or Clonidine --  avoid BB due to low resting HR.  Urine ALB 06 July 2023.      Relevant Orders   Bayer DCA Hb A1c Waived   CBC with Differential/Platelet   TSH     Respiratory   OSA on CPAP   Chronic, stable -- continue 100% use of CPAP at home.  Obtain new equipment when needed.        Endocrine   Type 2 diabetes mellitus in patient with obesity [facility]) - Primary   Diagnosed initially April 2024 and wanted to maintain diet focus.  A1c today 6.5%, well at goal.  Urine ALB 06 July 2023. Discussed options to include continue Metformin  or stop and start GLP1, SGLT2, DPP4.  Will continue Metformin , as prefers this and does not like needles.  Educated him on side effects and how medication works.  Offered referral to diabetes education, he declined at this time.   - Continue Metformin  1000 MG BID.  Recent kidney function stable. - Check sugars TID  - Continue ARB and statin therapy - Foot exam up to date.  Eye exam up to date, will work on getting records.      Relevant Orders   Bayer DCA Hb A1c Waived   Hyperlipidemia associated with type 2 diabetes mellitus (HCC)   Chronic, ongoing.  Continue current medication regimen and adjust as needed -- is on every other day Crestor  due to myalgia with increased doses + continue Zetia  daily  Will plan on repeat CMP and lipids. Educated him on stroke prevention goals: BP <130/80 LDL <55 -- recent was 39 A1C <6.5      Relevant Orders   Bayer DCA Hb A1c Waived   Comprehensive metabolic panel with GFR   Lipid Panel w/o Chol/HDL Ratio   Diabetes mellitus treated with oral medication (HCC)   Refer to plan of care for obesity.      Relevant Orders   Bayer DCA Hb A1c Waived     Musculoskeletal and Integument   Chronic idiopathic gout involving toe of left foot without tophus   Chronic, ongoing.  Check uric acid level today and continue current medication regimen.  Colchicine  for use during acute flares and Gabapentin  for his chronic knee pain as needed.  Consider discontinuation of Chlorthalidone  if ongoing flares.        Relevant Orders   Uric acid     Other   Morbid obesity (HCC)   BMI 43.93 with HTN/HLD, T2DM.  Recommended eating smaller high protein, low fat meals more frequently and exercising 30 mins a day 5 times a week with a goal of 10-15lb weight loss  in the next 3 months. Patient voiced their understanding and motivation to adhere to these recommendations.       History of stroke   In August 2020 and intraparenchymal bleed June 2022. Continue statin daily and avoid ASA due to past bleed.  BP at goal today.  Check A1c, CMP, lipid. Educated him on stroke prevention goals: BP <130/80 LDL <55 - recent was 39 A1C <6.5       Other Visit Diagnoses       Benign prostatic hyperplasia without lower urinary tract symptoms       PSA on labs today.   Relevant Orders   PSA     Flu vaccine need       Flu vaccine today, educated patient.   Relevant Orders   Flu vaccine trivalent PF, 6mos and older(Flulaval,Afluria,Fluarix,Fluzone) (Completed)     Pneumococcal vaccination given       PCV20 today, educated patient.   Relevant Orders   Pneumococcal conjugate vaccine 20-valent (Completed)     Encounter for annual physical exam       Annual physical today with labs and health maintenance reviewed, discussed with patient.       Discussed aspirin  prophylaxis for myocardial infarction prevention and decision was it was not indicated  LABORATORY TESTING:  Health maintenance labs ordered today as discussed above.   The natural history of prostate cancer and ongoing controversy regarding screening and potential treatment outcomes of prostate cancer has been discussed with the patient. The meaning of a false positive PSA and a false negative PSA has been discussed. He indicates understanding of the limitations of this screening test and wishes to proceed with screening PSA testing.   IMMUNIZATIONS:   - Tdap: Tetanus vaccination status  reviewed: Td vaccination indicated and given today. - Influenza: Administered today - Pneumovax: Not applicable - Prevnar: Administered today - Zostavax vaccine: Refused  SCREENING: - Colonoscopy: Up to date  Discussed with patient purpose of the colonoscopy is to detect colon cancer at curable precancerous or early stages   - AAA Screening: Not applicable  -Hearing Test: Not applicable  -Spirometry: Not applicable   PATIENT COUNSELING:    Sexuality: Discussed sexually transmitted diseases, partner selection, use of condoms, avoidance of unintended pregnancy  and contraceptive alternatives.   Advised to avoid cigarette smoking.  I discussed with the patient that most people either abstain from alcohol or drink within safe limits (<=14/week and <=4 drinks/occasion for males, <=7/weeks and <= 3 drinks/occasion for females) and that the risk for alcohol disorders and other health effects rises proportionally with the number of drinks per week and how often a drinker exceeds daily limits.  Discussed cessation/primary prevention of drug use and availability of treatment for abuse.   Diet: Encouraged to adjust caloric intake to maintain  or achieve ideal body weight, to reduce intake of dietary saturated fat and total fat, to limit sodium intake by avoiding high sodium foods and not adding table salt, and to maintain adequate dietary potassium and calcium  preferably from fresh fruits, vegetables, and low-fat dairy products.    Stressed the importance of regular exercise  Injury prevention: Discussed safety belts, safety helmets, smoke detector, smoking near bedding or upholstery.   Dental health: Discussed importance of regular tooth brushing, flossing, and dental visits.   Follow up plan: NEXT PREVENTATIVE PHYSICAL DUE IN 1 YEAR. Return in about 6 months (around 10/17/2024) for T2DM, HTN/HLD.

## 2024-04-19 NOTE — Assessment & Plan Note (Signed)
 Chronic, stable -- continue 100% use of CPAP at home.  Obtain new equipment when needed.

## 2024-04-19 NOTE — Assessment & Plan Note (Signed)
 BMI 43.93 with HTN/HLD, T2DM.  Recommended eating smaller high protein, low fat meals more frequently and exercising 30 mins a day 5 times a week with a goal of 10-15lb weight loss in the next 3 months. Patient voiced their understanding and motivation to adhere to these recommendations.

## 2024-04-19 NOTE — Assessment & Plan Note (Signed)
 Diagnosed initially April 2024 and wanted to maintain diet focus.  A1c today 6.5%, well at goal.  Urine ALB 06 July 2023. Discussed options to include continue Metformin  or stop and start GLP1, SGLT2, DPP4.  Will continue Metformin , as prefers this and does not like needles.  Educated him on side effects and how medication works.  Offered referral to diabetes education, he declined at this time.   - Continue Metformin  1000 MG BID.  Recent kidney function stable. - Check sugars TID  - Continue ARB and statin therapy - Foot exam up to date.  Eye exam up to date, will work on getting records.

## 2024-04-19 NOTE — Assessment & Plan Note (Signed)
 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 Continue statin daily and avoid ASA due to past bleed.  BP at goal today.  Check A1c, CMP, lipid. Educated him on stroke prevention goals: BP <130/80 LDL <55 - recent was 39 A1C <6.5

## 2024-04-19 NOTE — Assessment & Plan Note (Signed)
 Chronic, stable . BP well at goal today. Continue current medication regimen and recommend DASH diet at home, lowering sodium in diet to avoid fluid retention. Adjust regimen as needed. Continue to collaborate with cardiology as needed.  Labs: CBC, TSH, CMP.  Will further consider discontinuation of Chlorthalidone  if ongoing gout flares and possibly need to start Hydralazine or Clonidine -- avoid BB due to low resting HR.  Urine ALB 06 July 2023.

## 2024-04-19 NOTE — Assessment & Plan Note (Signed)
Chronic, ongoing.  Check uric acid level today and continue current medication regimen.  Colchicine for use during acute flares and Gabapentin for his chronic knee pain as needed. Consider discontinuation of Chlorthalidone if ongoing flares.

## 2024-04-19 NOTE — Assessment & Plan Note (Signed)
 Chronic, ongoing.  Continue current medication regimen and adjust as needed -- is on every other day Crestor  due to myalgia with increased doses + continue Zetia  daily  Will plan on repeat CMP and lipids. Educated him on stroke prevention goals: BP <130/80 LDL <55 -- recent was 39 A1C <6.5

## 2024-04-19 NOTE — Assessment & Plan Note (Signed)
 Refer to plan of care for obesity.

## 2024-04-20 ENCOUNTER — Ambulatory Visit: Payer: Self-pay | Admitting: Nurse Practitioner

## 2024-04-20 LAB — CBC WITH DIFFERENTIAL/PLATELET
Basophils Absolute: 0 x10E3/uL (ref 0.0–0.2)
Basos: 1 %
EOS (ABSOLUTE): 0.2 x10E3/uL (ref 0.0–0.4)
Eos: 3 %
Hematocrit: 40.1 % (ref 37.5–51.0)
Hemoglobin: 13.4 g/dL (ref 13.0–17.7)
Immature Grans (Abs): 0 x10E3/uL (ref 0.0–0.1)
Immature Granulocytes: 0 %
Lymphocytes Absolute: 1.2 x10E3/uL (ref 0.7–3.1)
Lymphs: 22 %
MCH: 29.9 pg (ref 26.6–33.0)
MCHC: 33.4 g/dL (ref 31.5–35.7)
MCV: 90 fL (ref 79–97)
Monocytes Absolute: 0.5 x10E3/uL (ref 0.1–0.9)
Monocytes: 8 %
Neutrophils Absolute: 3.7 x10E3/uL (ref 1.4–7.0)
Neutrophils: 66 %
Platelets: 205 x10E3/uL (ref 150–450)
RBC: 4.48 x10E6/uL (ref 4.14–5.80)
RDW: 14.9 % (ref 11.6–15.4)
WBC: 5.6 x10E3/uL (ref 3.4–10.8)

## 2024-04-20 LAB — COMPREHENSIVE METABOLIC PANEL WITH GFR
ALT: 64 IU/L — ABNORMAL HIGH (ref 0–44)
AST: 51 IU/L — ABNORMAL HIGH (ref 0–40)
Albumin: 4.3 g/dL (ref 3.9–4.9)
Alkaline Phosphatase: 77 IU/L (ref 47–123)
BUN/Creatinine Ratio: 14 (ref 10–24)
BUN: 13 mg/dL (ref 8–27)
Bilirubin Total: 0.4 mg/dL (ref 0.0–1.2)
CO2: 19 mmol/L — ABNORMAL LOW (ref 20–29)
Calcium: 8.7 mg/dL (ref 8.6–10.2)
Chloride: 105 mmol/L (ref 96–106)
Creatinine, Ser: 0.91 mg/dL (ref 0.76–1.27)
Globulin, Total: 2.6 g/dL (ref 1.5–4.5)
Glucose: 137 mg/dL — ABNORMAL HIGH (ref 70–99)
Potassium: 4.2 mmol/L (ref 3.5–5.2)
Sodium: 142 mmol/L (ref 134–144)
Total Protein: 6.9 g/dL (ref 6.0–8.5)
eGFR: 95 mL/min/1.73 (ref >59)

## 2024-04-20 LAB — TSH: TSH: 2.55 u[IU]/mL (ref 0.450–4.500)

## 2024-04-20 LAB — PSA: Prostate Specific Ag, Serum: 1.3 ng/mL (ref 0.0–4.0)

## 2024-04-20 LAB — LIPID PANEL W/O CHOL/HDL RATIO
Cholesterol, Total: 103 mg/dL (ref 100–199)
HDL: 37 mg/dL — ABNORMAL LOW (ref >39)
LDL Chol Calc (NIH): 40 mg/dL (ref 0–99)
Triglycerides: 156 mg/dL — ABNORMAL HIGH (ref 0–149)
VLDL Cholesterol Cal: 26 mg/dL (ref 5–40)

## 2024-04-20 LAB — URIC ACID: Uric Acid: 5.6 mg/dL (ref 3.8–8.4)

## 2024-04-20 NOTE — Progress Notes (Signed)
 Contacted via MyChart  Good morning [place], your labs have returned: - Kidney function, creatinine and eGFR, remains normal. Liver function showing elevations, mild. We will continue to monitor at visits and in future may benefit liver ultrasound if trend up or ongoing elevations. - Lipid panel shows LDL remains at goal for stroke prevention, <55. Your level is 40. Triglycerides a little elevated this check. Continue Zetia  and statin therapy. - Remainder of labs are all stable. Any questions? Keep being amazing!!  Thank you for allowing me to participate in your care.  I appreciate you. Kindest regards, Nivea Wojdyla

## 2024-05-05 ENCOUNTER — Other Ambulatory Visit: Payer: Self-pay | Admitting: Nurse Practitioner

## 2024-05-08 NOTE — Telephone Encounter (Signed)
 Provider aware of abnormal lab results Requested Prescriptions  Pending Prescriptions Disp Refills   meloxicam  (MOBIC ) 7.5 MG tablet [Pharmacy Med Name: MELOXICAM  7.5 MG TABLET] 90 tablet 0    Sig: TAKE 1 TABLET BY MOUTH EVERY DAY     Analgesics:  COX2 Inhibitors Failed - 05/08/2024  2:13 PM      Failed - Manual Review: Labs are only required if the patient has taken medication for more than 8 weeks.      Failed - AST in normal range and within 360 days    AST  Date Value Ref Range Status  04/19/2024 51 (H) 0 - 40 IU/L Final         Failed - ALT in normal range and within 360 days    ALT  Date Value Ref Range Status  04/19/2024 64 (H) 0 - 44 IU/L Final         Passed - HGB in normal range and within 360 days    Hemoglobin  Date Value Ref Range Status  04/19/2024 13.4 13.0 - 17.7 g/dL Final         Passed - Cr in normal range and within 360 days    Creatinine, Ser  Date Value Ref Range Status  04/19/2024 0.91 0.76 - 1.27 mg/dL Final         Passed - HCT in normal range and within 360 days    Hematocrit  Date Value Ref Range Status  04/19/2024 40.1 37.5 - 51.0 % Final         Passed - eGFR is 30 or above and within 360 days    GFR calc Af Amer  Date Value Ref Range Status  07/21/2020 85 >59 mL/min/1.73 Final    Comment:    **In accordance with recommendations from the NKF-ASN Task force,**   [facility] is in the process of updating its eGFR calculation to the   2021 CKD-EPI creatinine equation that estimates kidney function   without a race variable.    GFR calc non Af Amer  Date Value Ref Range Status  07/21/2020 74 >59 mL/min/1.73 Final   eGFR  Date Value Ref Range Status  04/19/2024 95 >59 mL/min/1.73 Final         Passed - Patient is not pregnant      Passed - Valid encounter within last 12 months    Recent Outpatient Visits           2 weeks ago Type 2 diabetes mellitus in patient with obesity (HCC)   [place] [facility] [place],  [place] T, NP   3 months ago Type 2 diabetes mellitus with obesity   [place] [facility] [place], [place] T, NP   7 months ago Type 2 diabetes mellitus with obesity   [place] [facility] [place], Melanie DASEN, NP

## 2024-05-16 ENCOUNTER — Other Ambulatory Visit: Payer: Self-pay | Admitting: Nurse Practitioner

## 2024-05-17 NOTE — Telephone Encounter (Signed)
 Requested Prescriptions  Pending Prescriptions Disp Refills   rosuvastatin  (CRESTOR ) 40 MG tablet [Pharmacy Med Name: ROSUVASTATIN  CALCIUM  40 MG TAB] 45 tablet 4    Sig: TAKE 1 TABLET BY MOUTH EVERY OTHER DAY     Cardiovascular:  Antilipid - Statins 2 Failed - 05/17/2024  2:06 PM      Failed - Lipid Panel in normal range within the last 12 months    Cholesterol, Total  Date Value Ref Range Status  04/19/2024 103 100 - 199 mg/dL Final   Cholesterol Piccolo, Waived  Date Value Ref Range Status  07/08/2019 100 <200 mg/dL Final    Comment:                            Desirable                <200                         Borderline High      200- 239                         High                     >239    LDL Chol Calc (NIH)  Date Value Ref Range Status  04/19/2024 40 0 - 99 mg/dL Final   HDL  Date Value Ref Range Status  04/19/2024 37 (L) >39 mg/dL Final   Triglycerides  Date Value Ref Range Status  04/19/2024 156 (H) 0 - 149 mg/dL Final   Triglycerides Piccolo,Waived  Date Value Ref Range Status  07/08/2019 100 <150 mg/dL Final    Comment:                            Normal                   <150                         Borderline High     150 - 199                         High                200 - 499                         Very High                >499          Passed - Cr in normal range and within 360 days    Creatinine, Ser  Date Value Ref Range Status  04/19/2024 0.91 0.76 - 1.27 mg/dL Final         Passed - Patient is not pregnant      Passed - Valid encounter within last 12 months    Recent Outpatient Visits           4 weeks ago Type 2 diabetes mellitus in patient with obesity (HCC)   [place] [facility] [place], [place] T, NP   4 months ago Type 2 diabetes mellitus with obesity   [place] [facility] [place], Melanie DASEN, NP  7 months ago Type 2 diabetes mellitus with obesity   [place] [facility]  [place], Jolene T, NP               famotidine  (PEPCID ) 40 MG tablet [Pharmacy Med Name: FAMOTIDINE  40 MG TABLET] 90 tablet 4    Sig: TAKE 1 TABLET (40 MG TOTAL) BY MOUTH DAILY AS NEEDED FOR HEARTBURN OR INDIGESTION.     Gastroenterology:  H2 Antagonists Passed - 05/17/2024  2:06 PM      Passed - Valid encounter within last 12 months    Recent Outpatient Visits           4 weeks ago Type 2 diabetes mellitus in patient with obesity (HCC)   [place] [facility] [place], [place] T, NP   4 months ago Type 2 diabetes mellitus with obesity   [place] [facility] [place], [place] T, NP   7 months ago Type 2 diabetes mellitus with obesity   [place] [facility] [place], Melanie DASEN, NP

## 2024-05-19 ENCOUNTER — Encounter: Payer: Self-pay | Admitting: Nurse Practitioner

## 2024-05-20 MED ORDER — FAMOTIDINE 40 MG PO TABS: 40.0000 mg | ORAL_TABLET | Freq: Every day | ORAL | 12 refills | Status: AC | PRN

## 2024-06-10 ENCOUNTER — Other Ambulatory Visit: Payer: Self-pay | Admitting: Nurse Practitioner

## 2024-06-11 NOTE — Telephone Encounter (Signed)
 Requested medications are due for refill today.  yes  Requested medications are on the active medications list.  yes  Last refill. 02/15/2024 #30 3 rf  Future visit scheduled.   yes  Notes to clinic.  Refill not delegated.     Requested Prescriptions  Pending Prescriptions Disp Refills   cyclobenzaprine  (FLEXERIL ) 10 MG tablet [Pharmacy Med Name: CYCLOBENZAPRINE  10 MG TABLET] 30 tablet 3    Sig: TAKE 1 TABLET BY MOUTH EVERY DAY AT BEDTIME AS NEEDED     Not Delegated - Analgesics:  Muscle Relaxants Failed - 06/11/2024 12:11 PM      Failed - This refill cannot be delegated      Passed - Valid encounter within last 6 months    Recent Outpatient Visits           1 month ago Type 2 diabetes mellitus in patient with obesity (HCC)   [place] [facility] [place], [place] T, NP   5 months ago Type 2 diabetes mellitus with obesity   [place] [facility] [place], [place] T, NP   8 months ago Type 2 diabetes mellitus with obesity   [place] [facility] [place], Melanie DASEN, NP

## 2024-06-15 NOTE — Patient Instructions (Signed)
 Be Involved in Caring For Your Health:  Taking Medications When medications are taken as directed, they can greatly improve your health. But if they are not taken as prescribed, they may not work. In some cases, not taking them correctly can be harmful. To help ensure your treatment remains effective and safe, understand your medications and how to take them. Bring your medications to each visit for review by your provider.  Your lab results, notes, and after visit summary will be available on My Chart. We strongly encourage you to use this feature. If lab results are abnormal the clinic will contact you with the appropriate steps. If the clinic does not contact you assume the results are satisfactory. You can always view your results on My Chart. If you have questions regarding your health or results, please contact the clinic during office hours. You can also ask questions on My Chart.  We at [facility] are grateful that you chose [place] to provide your care. We strive to provide evidence-based and compassionate care and are always looking for feedback. If you get a survey from the clinic please complete this so we can hear your opinions.  Insomnia Insomnia is a sleep disorder that makes it difficult to fall asleep or stay asleep. Insomnia can cause fatigue, low energy, difficulty concentrating, mood swings, and poor performance at work or school. There are three different ways to classify insomnia: Difficulty falling asleep. Difficulty staying asleep. Waking up too early in the morning. Any type of insomnia can be long-term (chronic) or short-term (acute). Both are common. Short-term insomnia usually lasts for 3 months or less. Chronic insomnia occurs at least three times a week for longer than 3 months. What are the causes? Insomnia may be caused by another condition, situation, or substance, such as: Having certain mental health conditions, such as anxiety and depression. Using  caffeine, alcohol, tobacco, or drugs. Having gastrointestinal conditions, such as gastroesophageal reflux disease (GERD). Having certain medical conditions. These include: Asthma. Alzheimer's disease. Stroke. Chronic pain. An overactive thyroid gland (hyperthyroidism). Other sleep disorders, such as restless legs syndrome and sleep apnea. Menopause. Sometimes, the cause of insomnia may not be known. What increases the risk? Risk factors for insomnia include: Gender. Females are affected more often than males. Age. Insomnia is more common as people get older. Stress and certain medical and mental health conditions. Lack of exercise. Having an irregular work schedule. This may include working night shifts and traveling between different time zones. What are the signs or symptoms? If you have insomnia, the main symptom is having trouble falling asleep or having trouble staying asleep. This may lead to other symptoms, such as: Feeling tired or having low energy. Feeling nervous about going to sleep. Not feeling rested in the morning. Having trouble concentrating. Feeling irritable, anxious, or depressed. How is this diagnosed? This condition may be diagnosed based on: Your symptoms and medical history. Your health care provider may ask about: Your sleep habits. Any medical conditions you have. Your mental health. A physical exam. How is this treated? Treatment for insomnia depends on the cause. Treatment may focus on treating an underlying condition that is causing the insomnia. Treatment may also include: Medicines to help you sleep. Counseling or therapy. Lifestyle adjustments to help you sleep better. Follow these instructions at home: Eating and drinking  Limit or avoid alcohol, caffeinated beverages, and products that contain nicotine and tobacco, especially close to bedtime. These can disrupt your sleep. Do not eat a large  meal or eat spicy foods right before bedtime. This  can lead to digestive discomfort that can make it hard for you to sleep. Sleep habits  Keep a sleep diary to help you and your health care provider figure out what could be causing your insomnia. Write down: When you sleep. When you wake up during the night. How well you sleep and how rested you feel the next day. Any side effects of medicines you are taking. What you eat and drink. Make your bedroom a dark, comfortable place where it is easy to fall asleep. Put up shades or blackout curtains to block light from outside. Use a white noise machine to block noise. Keep the temperature cool. Limit screen use before bedtime. This includes: Not watching TV. Not using your smartphone, tablet, or computer. Stick to a routine that includes going to bed and waking up at the same times every day and night. This can help you fall asleep faster. Consider making a quiet activity, such as reading, part of your nighttime routine. Try to avoid taking naps during the day so that you sleep better at night. Get out of bed if you are still awake after 15 minutes of trying to sleep. Keep the lights down, but try reading or doing a quiet activity. When you feel sleepy, go back to bed. General instructions Take over-the-counter and prescription medicines only as told by your health care provider. Exercise regularly as told by your health care provider. However, avoid exercising in the hours right before bedtime. Use relaxation techniques to manage stress. Ask your health care provider to suggest some techniques that may work well for you. These may include: Breathing exercises. Routines to release muscle tension. Visualizing peaceful scenes. Make sure that you drive carefully. Do not drive if you feel very sleepy. Keep all follow-up visits. This is important. Contact a health care provider if: You are tired throughout the day. You have trouble in your daily routine due to sleepiness. You continue to have  sleep problems, or your sleep problems get worse. Get help right away if: You have thoughts about hurting yourself or someone else. Get help right away if you feel like you may hurt yourself or others, or have thoughts about taking your own life. Go to your nearest emergency room or: Call 911. Call the National Suicide Prevention Lifeline at 512-196-3132 or 988. This is open 24 hours a day. Text the Crisis Text Line at 825-066-1380. Summary Insomnia is a sleep disorder that makes it difficult to fall asleep or stay asleep. Insomnia can be long-term (chronic) or short-term (acute). Treatment for insomnia depends on the cause. Treatment may focus on treating an underlying condition that is causing the insomnia. Keep a sleep diary to help you and your health care provider figure out what could be causing your insomnia. This information is not intended to replace advice given to you by your health care provider. Make sure you discuss any questions you have with your health care provider. Document Revised: 05/03/2021 Document Reviewed: 05/03/2021 Elsevier Patient Education  2024 [employer].

## 2024-06-17 MED ORDER — GABAPENTIN 300 MG PO CAPS: 300.0000 mg | ORAL_CAPSULE | Freq: Every day | ORAL | 3 refills | Status: AC

## 2024-06-17 NOTE — Addendum Note (Signed)
 Addended by: Terriona Horlacher T on: 06/17/2024 05:18 PM   Modules accepted: Orders

## 2024-06-21 ENCOUNTER — Encounter: Payer: Self-pay | Admitting: Nurse Practitioner

## 2024-06-21 ENCOUNTER — Ambulatory Visit (INDEPENDENT_AMBULATORY_CARE_PROVIDER_SITE_OTHER): Admitting: Nurse Practitioner

## 2024-06-21 VITALS — BP 125/73 | HR 61 | Temp 97.7°F | Resp 18 | Ht 71.65 in | Wt 320.0 lb

## 2024-06-21 DIAGNOSIS — G4733 Obstructive sleep apnea (adult) (pediatric): Secondary | ICD-10-CM

## 2024-06-21 DIAGNOSIS — Z8673 Personal history of transient ischemic attack (TIA), and cerebral infarction without residual deficits: Secondary | ICD-10-CM | POA: Diagnosis not present

## 2024-06-21 DIAGNOSIS — F5101 Primary insomnia: Secondary | ICD-10-CM | POA: Insufficient documentation

## 2024-06-21 MED ORDER — TRAZODONE HCL 50 MG PO TABS: 25.0000 mg | ORAL_TABLET | Freq: Every evening | ORAL | 3 refills | Status: AC | PRN

## 2024-06-21 NOTE — Progress Notes (Signed)
 "  BP 125/73 (BP Location: Left Arm, Patient Position: Sitting, Cuff Size: Large)   Pulse 61   Temp 97.7 F (36.5 C) (Oral)   Resp 18   Ht 5' 11.65 (1.82 m)   Wt (!) 320 lb (145.2 kg)   SpO2 96%   BMI 43.82 kg/m    Subjective:    Patient ID: Paul Cannon, male    DOB: 1960-11-30, 64 y.o.   MRN: [id number]  HPI: Paul Cannon is a 64 y.o. male  Chief Complaint  Patient presents with   Insomnia    Here for insomnia consultation, has been taking the gabapentin  a lot lately to help him sleep. Keeps some on him in case his knee flares up, was running real low.   Wife at bedside with patient.  INSOMNIA Currently taking dose of Zquil and two Gabapentin  (600 MG), this helps him go to sleep, but then wakes up 3 am to 4 am and will take Melatonin. His wife would like him to follow-up with neurology as she feels he is not 100% yet since his past stroke. Saw Dr. Maree last in 08/06/19. Duration: months over past 3 months Satisfied with sleep quality: no Difficulty falling asleep: no Difficulty staying asleep: yes Waking a few hours after sleep onset: yes Early morning awakenings: yes Daytime hypersomnolence: no Wakes feeling refreshed: no Good sleep hygiene: yes Apnea: uses CPAP every night Snoring: uses CPAP every night -- machine and tubing fairly new Depressed/anxious mood: no Recent stress: no Restless legs/nocturnal leg cramps: no Chronic pain/arthritis: no History of sleep study: yes Treatments attempted: melatonin and uinsom, Gabapentin , Zquil      04/19/2024   10:21 AM 01/12/2024   10:27 AM 06/28/2023   10:13 AM 03/28/2023   10:04 AM 09/26/2022   10:31 AM  Depression screen PHQ 2/9  Decreased Interest 0 0 0 0 0  Down, Depressed, Hopeless 0 0 0 0 0  PHQ - 2 Score 0 0 0 0 0  Altered sleeping 0 0 0 0 0  Tired, decreased energy 0 1 0 0 0  Change in appetite 0 0 0 0 0  Feeling bad or failure about yourself  0 0 0 0 0  Trouble concentrating 0 0 0 0 0  Moving  slowly or fidgety/restless 0 0 0 0 0  Suicidal thoughts 0 0 0 0 0  PHQ-9 Score 0 1  0  0  0   Difficult doing work/chores Not difficult at all Not difficult at all Not difficult at all Not difficult at all Not difficult at all     Data saved with a previous flowsheet row definition       04/19/2024   10:21 AM 01/12/2024   10:28 AM 06/28/2023   10:13 AM 03/28/2023   10:05 AM  GAD 7 : Generalized Anxiety Score  Nervous, Anxious, on Edge 0 0 0 0  Control/stop worrying 0 0 0 0  Worry too much - different things 0 1 0 0  Trouble relaxing 0 0 0 0  Restless 0 0 0 0  Easily annoyed or irritable 0 1 0 0  Afraid - awful might happen 0 0 0 0  Total GAD 7 Score 0 2 0 0  Anxiety Difficulty Not difficult at all Not difficult at all Not difficult at all Not difficult at all   Relevant past medical, surgical, family and social history reviewed and updated as indicated. Interim medical history since our last visit reviewed. Allergies and medications  reviewed and updated.  Review of Systems  Constitutional:  Negative for activity change, diaphoresis, fatigue and fever.  Respiratory:  Negative for cough, chest tightness, shortness of breath and wheezing.   Cardiovascular:  Negative for chest pain, palpitations and leg swelling.  Gastrointestinal: Negative.   Neurological: Negative.   Psychiatric/Behavioral:  Positive for sleep disturbance.     Per HPI unless specifically indicated above     Objective:    BP 125/73 (BP Location: Left Arm, Patient Position: Sitting, Cuff Size: Large)   Pulse 61   Temp 97.7 F (36.5 C) (Oral)   Resp 18   Ht 5' 11.65 (1.82 m)   Wt (!) 320 lb (145.2 kg)   SpO2 96%   BMI 43.82 kg/m   Wt Readings from Last 3 Encounters:  06/21/24 (!) 320 lb (145.2 kg)  04/19/24 (!) 320 lb 12.8 oz (145.5 kg)  01/12/24 (!) 322 lb 3.2 oz (146.1 kg)    Physical Exam Vitals and nursing note reviewed.  Constitutional:      General: He is awake. He is not in acute distress.     Appearance: He is well-developed and well-groomed. He is obese. He is not ill-appearing or toxic-appearing.  HENT:     Head: Normocephalic.     Right Ear: Hearing and external ear normal.     Left Ear: Hearing and external ear normal.  Eyes:     General: Lids are normal.     Extraocular Movements: Extraocular movements intact.     Conjunctiva/sclera: Conjunctivae normal.  Neck:     Thyroid : No thyromegaly.     Vascular: No carotid bruit.  Cardiovascular:     Rate and Rhythm: Normal rate and regular rhythm.     Heart sounds: Normal heart sounds. No murmur heard.    No gallop.  Pulmonary:     Effort: Pulmonary effort is normal. No accessory muscle usage or respiratory distress.     Breath sounds: Normal breath sounds. No decreased breath sounds, wheezing or rales.  Abdominal:     General: Bowel sounds are normal. There is no distension.     Palpations: Abdomen is soft.     Tenderness: There is no abdominal tenderness.  Musculoskeletal:     Cervical back: Full passive range of motion without pain.     Right lower leg: No edema.     Left lower leg: No edema.  Lymphadenopathy:     Cervical: No cervical adenopathy.  Skin:    General: Skin is warm.     Capillary Refill: Capillary refill takes less than 2 seconds.  Neurological:     Mental Status: He is alert and oriented to person, place, and time.     Deep Tendon Reflexes: Reflexes are normal and symmetric.     Reflex Scores:      Brachioradialis reflexes are 2+ on the right side and 2+ on the left side.      Patellar reflexes are 2+ on the right side and 2+ on the left side. Psychiatric:        Attention and Perception: Attention normal.        Mood and Affect: Mood normal.        Speech: Speech normal.        Behavior: Behavior normal. Behavior is cooperative.        Thought Content: Thought content normal.    Results for orders placed or performed in visit on 04/22/24  OPHTHALMOLOGY REPORT-SCANNED   Collection Time:  04/02/24 10:04  AM  Result Value Ref Range   HM Diabetic Eye Exam Retinopathy (A) No Retinopathy   A Comment        Assessment & Plan:   Problem List Items Addressed This Visit       Respiratory   OSA on CPAP   Chronic, stable -- continue 100% use of CPAP at home.  Obtain new equipment when needed.        Other   Primary insomnia - Primary   Starting over past few months, difficulty getting and staying asleep. Reports CPAP is working well. Recent labs were reassuring. Will trial a low dose of Trazodone  25 to 50 MG at night as needed, advised him to reduce Gabapentin  back to 300 MG and do not take ZQuil. Educated him on Trazodone , his wife takes this as well. Also recommended he cut back on Melatonin dosing, no more than 10 MG if needed and not to take with Gabapentin  and Trazodone .        History of stroke   In August 2020 and intraparenchymal bleed June 2022. Continue statin daily and avoid ASA due to past bleed.  BP at goal today.  Check A1c, CMP, lipid. Educated him on stroke prevention goals: BP <130/80 LDL <55  A1C <6.5 - Referral to return to neurology placed per request.       Relevant Orders   Ambulatory referral to Neurology     Follow up plan: Return in about 4 weeks (around 07/19/2024) for Insomnia.      "

## 2024-06-21 NOTE — Assessment & Plan Note (Signed)
 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 Continue statin daily and avoid ASA due to past bleed.  BP at goal today.  Check A1c, CMP, lipid. Educated him on stroke prevention goals: BP <130/80 LDL <55  A1C <6.5 - Referral to return to neurology placed per request.

## 2024-06-21 NOTE — Assessment & Plan Note (Signed)
 Starting over past few months, difficulty getting and staying asleep. Reports CPAP is working well. Recent labs were reassuring. Will trial a low dose of Trazodone  25 to 50 MG at night as needed, advised him to reduce Gabapentin  back to 300 MG and do not take ZQuil. Educated him on Trazodone , his wife takes this as well. Also recommended he cut back on Melatonin dosing, no more than 10 MG if needed and not to take with Gabapentin  and Trazodone .

## 2024-06-21 NOTE — Assessment & Plan Note (Signed)
 Chronic, stable -- continue 100% use of CPAP at home.  Obtain new equipment when needed.

## 2024-07-12 ENCOUNTER — Encounter: Payer: Self-pay | Admitting: Nurse Practitioner

## 2024-07-12 DIAGNOSIS — G8929 Other chronic pain: Secondary | ICD-10-CM

## 2024-07-26 ENCOUNTER — Ambulatory Visit: Admitting: Nurse Practitioner

## 2024-10-18 ENCOUNTER — Ambulatory Visit: Admitting: Nurse Practitioner
